# Patient Record
Sex: Female | Born: 1960 | Race: White | Hispanic: No | Marital: Married | Smoking: Former smoker
Health system: Southern US, Community
[De-identification: ages and names within clinical notes are randomized; demographics above are authoritative.]

## PROBLEM LIST (undated history)

## (undated) DIAGNOSIS — F419 Anxiety disorder, unspecified: Secondary | ICD-10-CM

## (undated) DIAGNOSIS — E119 Type 2 diabetes mellitus without complications: Secondary | ICD-10-CM

## (undated) HISTORY — DX: Anxiety disorder, unspecified: F41.9

---

## 2000-12-14 HISTORY — PX: VAGINAL HYSTERECTOMY: SUR661

## 2001-12-14 HISTORY — PX: CARPAL TUNNEL RELEASE: SHX101

## 2008-09-21 ENCOUNTER — Ambulatory Visit: Payer: Self-pay | Admitting: Urology

## 2009-10-21 ENCOUNTER — Ambulatory Visit: Payer: Self-pay | Admitting: Family

## 2010-02-27 ENCOUNTER — Ambulatory Visit: Payer: Self-pay | Admitting: Otolaryngology

## 2010-07-01 ENCOUNTER — Ambulatory Visit: Payer: Self-pay | Admitting: Internal Medicine

## 2010-12-14 HISTORY — PX: TONSILLECTOMY: SUR1361

## 2011-08-10 LAB — HM COLONOSCOPY: HM Colonoscopy: NORMAL

## 2011-10-27 ENCOUNTER — Other Ambulatory Visit: Payer: Self-pay | Admitting: Internal Medicine

## 2011-10-28 ENCOUNTER — Other Ambulatory Visit: Payer: Self-pay | Admitting: Internal Medicine

## 2011-10-28 MED ORDER — TRAMADOL HCL 50 MG PO TABS: 50.0000 mg | ORAL_TABLET | Freq: Four times a day (QID) | ORAL | Status: DC | PRN

## 2011-11-24 ENCOUNTER — Other Ambulatory Visit: Payer: Self-pay | Admitting: Internal Medicine

## 2011-11-26 ENCOUNTER — Other Ambulatory Visit: Payer: Self-pay | Admitting: Internal Medicine

## 2012-02-23 ENCOUNTER — Ambulatory Visit: Payer: Self-pay | Admitting: Internal Medicine

## 2012-02-23 ENCOUNTER — Other Ambulatory Visit: Payer: Self-pay | Admitting: Internal Medicine

## 2012-02-23 ENCOUNTER — Encounter: Payer: Self-pay | Admitting: Internal Medicine

## 2012-02-23 ENCOUNTER — Ambulatory Visit (INDEPENDENT_AMBULATORY_CARE_PROVIDER_SITE_OTHER): Payer: BC Managed Care – PPO | Admitting: Internal Medicine

## 2012-02-23 VITALS — BP 126/72 | HR 74 | Temp 97.9°F | Resp 14 | Ht 61.0 in | Wt 121.2 lb

## 2012-02-23 DIAGNOSIS — M129 Arthropathy, unspecified: Secondary | ICD-10-CM

## 2012-02-23 DIAGNOSIS — M1990 Unspecified osteoarthritis, unspecified site: Secondary | ICD-10-CM

## 2012-02-23 DIAGNOSIS — M5412 Radiculopathy, cervical region: Secondary | ICD-10-CM

## 2012-02-23 DIAGNOSIS — M542 Cervicalgia: Secondary | ICD-10-CM

## 2012-02-23 DIAGNOSIS — G5600 Carpal tunnel syndrome, unspecified upper limb: Secondary | ICD-10-CM

## 2012-02-23 DIAGNOSIS — G5602 Carpal tunnel syndrome, left upper limb: Secondary | ICD-10-CM

## 2012-02-23 DIAGNOSIS — Z13220 Encounter for screening for lipoid disorders: Secondary | ICD-10-CM

## 2012-02-23 DIAGNOSIS — M792 Neuralgia and neuritis, unspecified: Secondary | ICD-10-CM | POA: Insufficient documentation

## 2012-02-23 MED ORDER — DICLOFENAC SODIUM 75 MG PO TBEC: 75.0000 mg | DELAYED_RELEASE_TABLET | Freq: Two times a day (BID) | ORAL | Status: DC

## 2012-02-23 MED ORDER — HYDROCODONE-ACETAMINOPHEN 10-325 MG PO TABS: 1.0000 | ORAL_TABLET | Freq: Three times a day (TID) | ORAL | Status: AC | PRN

## 2012-02-23 MED ORDER — TRAMADOL HCL 50 MG PO TABS: 50.0000 mg | ORAL_TABLET | Freq: Four times a day (QID) | ORAL | Status: DC | PRN

## 2012-02-23 NOTE — Assessment & Plan Note (Signed)
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  Plain films, autoimmune serologies ordered.

## 2012-02-23 NOTE — Patient Instructions (Signed)
Come back for fasting labs at your earliest convenience (no food or drink)    X rays of left thumb,  And MRI cervical spine to be scheduled.    Referral to Neurologist at [facility] in [place] in process  We will try a stronger antiinflammatory than advil called diclofenac . (do not take advil with it,  But continue tramadol)

## 2012-02-23 NOTE — Assessment & Plan Note (Addendum)
I believe she has two separate issues : carpal tunnel and radiculopathy of left shoulder , possibly from cervical spinal stenosis. MRI of cervical spine ordered.

## 2012-02-23 NOTE — Progress Notes (Signed)
Subjective:    Patient ID: Sherry Petersen, female    DOB: [birth year], 51 y.o.   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 sometimes to the shoulder.  hadn and foreaem has intermittent nu,mbness.  Tried wearing a nighttime neutral splint which she purchased at [employer] no difference. Has persistent neck pain with prior neck fmlms but no prior MR.    No past medical history on file. Current Outpatient Prescriptions on File Prior to Visit  Medication Sig Dispense Refill  . amitriptyline (ELAVIL) 100 MG tablet TAKE 1 TABLET BY MOUTH EVERY NIGHT AT BEDTIME  30 tablet  6     Review of Systems  Constitutional: Negative for fever, chills and unexpected weight change.  HENT: Negative for hearing loss, ear pain, nosebleeds, congestion, sore throat, facial swelling, rhinorrhea, sneezing, mouth sores, trouble swallowing, neck pain, neck stiffness, voice change, postnasal drip, sinus pressure, tinnitus and ear discharge.   Eyes: Negative for pain, discharge, redness and visual disturbance.  Respiratory: Negative for cough, chest tightness, shortness of breath, wheezing and stridor.   Cardiovascular: Negative for chest pain, palpitations and leg swelling.  Musculoskeletal: Positive for arthralgias. Negative for myalgias.  Skin: Negative for color change and rash.  Neurological: Positive for numbness. Negative for dizziness, weakness, light-headedness and headaches.  Hematological: Negative for adenopathy.       Objective:   Physical Exam  Constitutional: She is oriented to person, place, and time. She appears well-developed and well-nourished.  HENT:  Mouth/Throat: Oropharynx is clear and moist.  Eyes: EOM are normal. Pupils are equal, round, and reactive to light. No scleral icterus.  Neck: Normal range  of motion. Neck supple. No JVD present. No thyromegaly present.  Cardiovascular: Normal rate, regular rhythm, normal heart sounds and intact distal pulses.   Pulmonary/Chest: Effort normal and breath sounds normal.  Abdominal: Soft. Bowel sounds are normal. She exhibits no mass. There is no tenderness.  Musculoskeletal: Normal range of motion. She exhibits tenderness. She exhibits no edema.       Arms: Lymphadenopathy:    She has no cervical adenopathy.  Neurological: She is alert and oriented to person, place, and time.  Skin: Skin is warm and dry.  Psychiatric: She has a normal mood and affect.      Assessment & Plan:   Neuralgia of upper extremity I believe she has two separate issues : carpal tunnel and radiculopathy of left shoulder , possibly from cervical spinal stenosis. MRI of cervical spine ordered.   Arthritis Vs DJD of left thumb.  Plain films, autoimmune serologies ordered.     Updated Medication List Outpatient Encounter Prescriptions as of 02/23/2012  Medication Sig Dispense Refill  . amitriptyline (ELAVIL) 100 MG tablet TAKE 1 TABLET BY MOUTH EVERY NIGHT AT BEDTIME  30 tablet  6  . DISCONTD: traMADol (ULTRAM) 50 MG tablet Take 1 tablet (50 mg total) by mouth every 6 (six) hours as needed for pain. Maximum dose= 8 tablets per day  120 tablet  2  . DISCONTD: traMADol (ULTRAM) 50 MG tablet Take 1 tablet (50 mg total) by mouth every 6 (six) hours as needed for pain. Maximum dose= 8 tablets per day  120 tablet  2  . HYDROcodone-acetaminophen (NORCO) 10-325 MG per tablet Take 1 tablet by mouth every 8 (  eight) hours as needed for pain.  60 tablet  0  . DISCONTD: diclofenac (VOLTAREN) 75 MG EC tablet Take 1 tablet (75 mg total) by mouth 2 (two) times daily.  60 tablet  1  . DISCONTD: traMADol (ULTRAM) 50 MG tablet TAKE ONE TABLET BY MOUTH EVERY 6 HOURS AS NEEDED FOR PAIN  120 tablet  3

## 2012-02-26 ENCOUNTER — Telehealth: Payer: Self-pay | Admitting: Internal Medicine

## 2012-02-26 ENCOUNTER — Other Ambulatory Visit (INDEPENDENT_AMBULATORY_CARE_PROVIDER_SITE_OTHER): Payer: BC Managed Care – PPO | Admitting: *Deleted

## 2012-02-26 DIAGNOSIS — M5412 Radiculopathy, cervical region: Secondary | ICD-10-CM

## 2012-02-26 DIAGNOSIS — Z13220 Encounter for screening for lipoid disorders: Secondary | ICD-10-CM

## 2012-02-26 DIAGNOSIS — M792 Neuralgia and neuritis, unspecified: Secondary | ICD-10-CM

## 2012-02-26 DIAGNOSIS — G5602 Carpal tunnel syndrome, left upper limb: Secondary | ICD-10-CM

## 2012-02-26 DIAGNOSIS — M1990 Unspecified osteoarthritis, unspecified site: Secondary | ICD-10-CM

## 2012-02-26 DIAGNOSIS — G5600 Carpal tunnel syndrome, unspecified upper limb: Secondary | ICD-10-CM

## 2012-02-26 DIAGNOSIS — M129 Arthropathy, unspecified: Secondary | ICD-10-CM

## 2012-02-26 LAB — TSH: TSH: 0.58 u[IU]/mL (ref 0.35–5.50)

## 2012-02-26 LAB — LIPID PANEL
HDL: 63.8 mg/dL (ref >39.00)
Triglycerides: 78 mg/dL (ref 0.0–149.0)
VLDL: 15.6 mg/dL (ref 0.0–40.0)

## 2012-02-26 LAB — LDL CHOLESTEROL, DIRECT: Direct LDL: 136.2 mg/dL

## 2012-02-26 LAB — SEDIMENTATION RATE: Sed Rate: 8 mm/hr (ref 0–22)

## 2012-02-26 NOTE — Telephone Encounter (Signed)
Thumb joint looked ok on plain films,  No arthritis changes.

## 2012-02-26 NOTE — Telephone Encounter (Signed)
Patient notified of results.

## 2012-02-27 LAB — COMPLETE METABOLIC PANEL WITH GFR
AST: 14 U/L (ref 0–37)
Albumin: 4.1 g/dL (ref 3.5–5.2)
Alkaline Phosphatase: 76 U/L (ref 39–117)
BUN: 17 mg/dL (ref 6–23)
Creat: 0.74 mg/dL (ref 0.50–1.10)
Potassium: 4.3 mEq/L (ref 3.5–5.3)
Total Bilirubin: 0.4 mg/dL (ref 0.3–1.2)

## 2012-02-27 LAB — C-REACTIVE PROTEIN: CRP: 0.03 mg/dL (ref <0.60)

## 2012-02-29 LAB — ANA: Anti Nuclear Antibody(ANA): NEGATIVE

## 2012-03-02 ENCOUNTER — Ambulatory Visit: Payer: Self-pay | Admitting: Internal Medicine

## 2012-03-03 ENCOUNTER — Telehealth: Payer: Self-pay | Admitting: Internal Medicine

## 2012-03-03 DIAGNOSIS — R200 Anesthesia of skin: Secondary | ICD-10-CM

## 2012-03-03 NOTE — Telephone Encounter (Signed)
Her MRI of the neck was fine.   No signs of disk protusion  that could be causing her numbness.  Her symptoms may be from carpal tunnel.  Would she like to see a neurologist for confirmation testing?

## 2012-03-04 ENCOUNTER — Encounter: Payer: Self-pay | Admitting: Internal Medicine

## 2012-03-04 NOTE — Telephone Encounter (Signed)
Patient notified of results.  She wants to see a neurologists.  Please advise.

## 2012-03-04 NOTE — Telephone Encounter (Signed)
Order for referral to Dr. Modesto Charon in [place] [facility]

## 2012-03-07 ENCOUNTER — Encounter: Payer: Self-pay | Admitting: Neurology

## 2012-03-07 NOTE — Telephone Encounter (Signed)
Patient notified we will be setting her up with Dr. Modesto Charon

## 2012-03-14 ENCOUNTER — Other Ambulatory Visit (INDEPENDENT_AMBULATORY_CARE_PROVIDER_SITE_OTHER): Payer: BC Managed Care – PPO

## 2012-03-14 ENCOUNTER — Encounter: Payer: Self-pay | Admitting: Neurology

## 2012-03-14 ENCOUNTER — Ambulatory Visit (INDEPENDENT_AMBULATORY_CARE_PROVIDER_SITE_OTHER): Payer: BC Managed Care – PPO | Admitting: Neurology

## 2012-03-14 VITALS — BP 134/78 | HR 62 | Wt 121.6 lb

## 2012-03-14 DIAGNOSIS — R209 Unspecified disturbances of skin sensation: Secondary | ICD-10-CM

## 2012-03-14 DIAGNOSIS — R200 Anesthesia of skin: Secondary | ICD-10-CM

## 2012-03-14 LAB — SEDIMENTATION RATE: Sed Rate: 8 mm/hr (ref 0–22)

## 2012-03-14 LAB — COMPREHENSIVE METABOLIC PANEL
ALT: 10 U/L (ref 0–35)
Alkaline Phosphatase: 79 U/L (ref 39–117)
Glucose, Bld: 96 mg/dL (ref 70–99)
Sodium: 143 mEq/L (ref 135–145)
Total Bilirubin: 0.2 mg/dL — ABNORMAL LOW (ref 0.3–1.2)
Total Protein: 6.5 g/dL (ref 6.0–8.3)

## 2012-03-14 LAB — CBC WITH DIFFERENTIAL/PLATELET
Basophils Absolute: 0 10*3/uL (ref 0.0–0.1)
Eosinophils Absolute: 0.1 10*3/uL (ref 0.0–0.7)
HCT: 40.2 % (ref 36.0–46.0)
Lymphs Abs: 2.1 10*3/uL (ref 0.7–4.0)
MCHC: 33.2 g/dL (ref 30.0–36.0)
MCV: 93.7 fl (ref 78.0–100.0)
Monocytes Absolute: 0.4 10*3/uL (ref 0.1–1.0)
Platelets: 243 10*3/uL (ref 150.0–400.0)
RDW: 13.7 % (ref 11.5–14.6)

## 2012-03-14 NOTE — Progress Notes (Signed)
Dear Dr. Darrick Huntsman,  I saw  Sherry Petersen in consultation today in [facility] clinic for her problem with bilateral hand numbness and tingling.  As you may recall, she is a 51 y.o. year old female with a history of right side carpal tunnel release who presents with a six month history of worsening bilateral hand numbness that involves all the fingers.  She says it is worse in the morning.  Shaking the hand does not relieve it but hyperextending it does.  She does get neck pain, mostly on the right, but it seems unrelated to the numbness and tingling.  She gets intermittent sharp pains in her head and right inner thigh at times again unrelated to the constant hand sensory changes.  You got an MRI of her C-spine that was reportedly negative.  She says that her previous CTS of her right hand did not feel the same.  Medical Hx:  right CTS  Past Surgical History  Procedure Date  . Carpal tunnel release 2003  . Vaginal hysterectomy 2002  . Tonsillectomy 2012    History   Social History  . Marital Status: Married    Spouse Name: N/A    Number of Children: N/A  . Years of Education: N/A   Social History Main Topics  . Smoking status: Current Some Day Smoker    Types: Cigarettes  . Smokeless tobacco: Never Used   Comment: smokes 4-5 cigs per day  . Alcohol Use: No     never  . Drug Use: No  . Sexually Active: Not on file   Other Topics Concern  . Not on file   Social History Narrative  . No narrative on file    FamHx:  no neurologic problems.  Current Outpatient Prescriptions on File Prior to Visit  Medication Sig Dispense Refill  . amitriptyline (ELAVIL) 100 MG tablet TAKE 1 TABLET BY MOUTH EVERY NIGHT AT BEDTIME  30 tablet  6  . diclofenac (VOLTAREN) 75 MG EC tablet TAKE 1 TABLET BY MOUTH TWICE DAILY  180 tablet  0    Allergies  Allergen Reactions  . Penicillins   . Sulfa Drugs Cross Reactors       ROS:  13 systems were reviewed and are notable for no fevers,  chills, weight loss.  She has pain in her left thumb MCP joint.  All other review of systems are unremarkable.   Examination:  Filed Vitals:   03/14/12 1218  BP: 134/78  Pulse: 62  Weight: 121 lb 9.6 oz (55.157 kg)     In general, well appearing women.  Cardiovascular: The patient has a regular rate and rhythm and no carotid bruits.  Fundoscopy:  Disks are flat. Vessel caliber within normal limits.  Mental status:   The patient is oriented to person, place and time. Recent and remote memory are intact. Attention span and concentration are normal. Language including repetition, naming, following commands are intact. Fund of knowledge of current and historical events, as well as vocabulary are normal.  Cranial Nerves: Pupils are equally round and reactive to light. Visual fields full to confrontation. Extraocular movements are intact without nystagmus. Facial sensation and muscles of mastication are intact. Muscles of facial expression are symmetric. Hearing intact to bilateral finger rub. Tongue protrusion, uvula, palate midline.  Shoulder shrug intact  Motor:  The patient has normal bulk and tone, no pronator drift.  There are no adventitious movements.  5/5 muscle strength bilaterally.  Reflexes:   Biceps  Triceps  Brachioradialis Knee Ankle  Right 2+  1+  1+   2+ 2+  Left  2+  1+  2+   2+ 2+  Toes down  Coordination:  Normal finger to nose.  No dysdiadokinesia.  Sensation is seems decreased to temperature and pain in hands -> ?length dep.  Vibration normal elsewhere.  Gait and Station are normal.  Tandem gait is intact.  Romberg is negative.  Illiciting maneuvers ->  + Left wrist Tinel's, - bilateral ulnar Tinel's at elbow, - bilateral spurlings.   Impression.Recs 1.  Bilateral hand numbness - likely CTS bilaterally.  I am going to get an EMG/NCS to confirm CTS and rule out radiculopathy.  I will send off PN labs and get a copy of the MRI C-spine.  If NCS confirms CTS  then I will send her for consideration of decompression of the left CT as it is the worse one.   We will see the patient back in 2 months.  Thank you for having [place] see Sherry Petersen in consultation.  Feel free to contact me with any questions.  Lupita Raider Modesto Charon, MD [facility], [place] [address] [place], [place] [address] Phone: [phone] Fax: [phone].

## 2012-03-14 NOTE — Patient Instructions (Signed)
Go to the basement to have your labs drawn today.  Your NCV/EMG is scheduled at [facility] located at [address] in [place] on Monday, April 22nd at 10:45 am. Please arrive 15 minutes prior to your scheduled appointment.  [phone]

## 2012-03-15 ENCOUNTER — Encounter: Payer: Self-pay | Admitting: Internal Medicine

## 2012-03-15 LAB — C-REACTIVE PROTEIN: CRP: 0.05 mg/dL (ref <0.60)

## 2012-03-16 LAB — PROTEIN ELECTROPHORESIS, SERUM
Alpha-2-Globulin: 11 % (ref 7.1–11.8)
Gamma Globulin: 6.3 % — ABNORMAL LOW (ref 11.1–18.8)

## 2012-03-16 LAB — METHYLMALONIC ACID, SERUM: Methylmalonic Acid, Quant: 0.31 umol/L (ref <0.40)

## 2012-03-16 LAB — IMMUNOFIXATION ELECTROPHORESIS
IgA: 63 mg/dL — ABNORMAL LOW (ref 69–380)
IgG (Immunoglobin G), Serum: 398 mg/dL — ABNORMAL LOW (ref 690–1700)

## 2012-03-25 ENCOUNTER — Telehealth: Payer: Self-pay | Admitting: Neurology

## 2012-03-25 ENCOUNTER — Ambulatory Visit: Payer: BC Managed Care – PPO | Admitting: Neurology

## 2012-03-25 NOTE — Telephone Encounter (Signed)
Spoke with the patient. Information given re: lab results. Discussed Vit B 12 supplement if desired. No other concerns mentioned at this time.

## 2012-03-25 NOTE — Telephone Encounter (Signed)
Message copied by Benay Spice on Fri Mar 25, 2012  8:36 AM ------      Message from: Denton Meek H      Created: Wed Mar 23, 2012  6:20 PM       please let Ms. Whitcomb know that her labs looked looked ok.  Her B12 was normal but on the low side so she may want to take a vitamin b12 supplement 1mg  a day.

## 2012-03-28 ENCOUNTER — Encounter: Payer: Self-pay | Admitting: Internal Medicine

## 2012-04-10 NOTE — Progress Notes (Addendum)
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  Did not want further testing.  going to see if patient would like to see a surgeon about her neuropathy.

## 2012-04-11 ENCOUNTER — Telehealth: Payer: Self-pay | Admitting: Neurology

## 2012-04-11 NOTE — Telephone Encounter (Signed)
Message copied by Benay Spice on Mon Apr 11, 2012  8:58 AM ------      Message from: Milas Gain      Created: Sun Apr 10, 2012 10:31 PM      Regarding: NCS       Let Ms. Brackens know that her NCS revealed signs of carpal tunnel at her left wrist.  We could refer her for consideration of possible decompression(to Dr. Amanda Pea or whomever she prefers) if she would like or she can wait to discuss it with me at her next appt.

## 2012-04-11 NOTE — Telephone Encounter (Signed)
Left a message for the patient to return my call.  

## 2012-04-11 NOTE — Telephone Encounter (Signed)
The patient returned my call. Information given re: NCV and CTS. The patient wishes to wait and discuss options at her f/u appointment in June. She does have splints but they really don't help. Suggested OTC pain meds as per package directions. The patient agrees with this plan.

## 2012-04-13 ENCOUNTER — Encounter: Payer: Self-pay | Admitting: Neurology

## 2012-05-24 ENCOUNTER — Ambulatory Visit: Payer: BC Managed Care – PPO | Admitting: Neurology

## 2012-06-29 ENCOUNTER — Ambulatory Visit (INDEPENDENT_AMBULATORY_CARE_PROVIDER_SITE_OTHER): Payer: BC Managed Care – PPO | Admitting: Internal Medicine

## 2012-06-29 ENCOUNTER — Encounter: Payer: Self-pay | Admitting: Internal Medicine

## 2012-06-29 VITALS — BP 134/78 | HR 62 | Temp 98.7°F | Ht 61.0 in | Wt 120.0 lb

## 2012-06-29 DIAGNOSIS — Z1239 Encounter for other screening for malignant neoplasm of breast: Secondary | ICD-10-CM

## 2012-06-29 DIAGNOSIS — F419 Anxiety disorder, unspecified: Secondary | ICD-10-CM

## 2012-06-29 DIAGNOSIS — F489 Nonpsychotic mental disorder, unspecified: Secondary | ICD-10-CM

## 2012-06-29 DIAGNOSIS — K5909 Other constipation: Secondary | ICD-10-CM | POA: Insufficient documentation

## 2012-06-29 DIAGNOSIS — F411 Generalized anxiety disorder: Secondary | ICD-10-CM

## 2012-06-29 DIAGNOSIS — F17200 Nicotine dependence, unspecified, uncomplicated: Secondary | ICD-10-CM

## 2012-06-29 DIAGNOSIS — K5900 Constipation, unspecified: Secondary | ICD-10-CM

## 2012-06-29 DIAGNOSIS — G5600 Carpal tunnel syndrome, unspecified upper limb: Secondary | ICD-10-CM

## 2012-06-29 DIAGNOSIS — F5105 Insomnia due to other mental disorder: Secondary | ICD-10-CM

## 2012-06-29 DIAGNOSIS — G5602 Carpal tunnel syndrome, left upper limb: Secondary | ICD-10-CM

## 2012-06-29 DIAGNOSIS — Z720 Tobacco use: Secondary | ICD-10-CM

## 2012-06-29 MED ORDER — LUBIPROSTONE 8 MCG PO CAPS: 8.0000 ug | ORAL_CAPSULE | Freq: Two times a day (BID) | ORAL | Status: DC

## 2012-06-29 MED ORDER — ALPRAZOLAM 0.5 MG PO TABS: 0.5000 mg | ORAL_TABLET | Freq: Every evening | ORAL | Status: DC | PRN

## 2012-06-29 MED ORDER — PREDNISONE (PAK) 10 MG PO TABS: ORAL_TABLET | ORAL | Status: AC

## 2012-06-29 NOTE — Progress Notes (Signed)
Patient ID: Sherry Petersen, female   DOB: 19-Feb-1961, 51 y.o.   MRN: [id number]  Patient Active Problem List  Diagnosis  . Neuralgia of upper extremity  . Arthritis  . Carpal tunnel syndrome of left wrist  . Constipation  . Tobacco abuse  . Insomnia secondary to anxiety    Subjective:  CC:   Chief Complaint  Patient presents with  . Hypertension    ?possible elevated BP  . Headache  . Stress    HPI:   Sherry Petersen a 51 y.o. female who presents for follow up on chronic conditions 1) left wrist pain , diagnosed with CTS by EMG studies recently.  No response to diclofenac. No weakness or fumbling with hand.  No numbness.  Wants to defer surgery for now. 2) Insomnia, increased anxiety secondary to caring for ailing mother  At home with hospice for dementia.  3) Chronic constipation, despite daily use of stool softeners and fiber laxatives.   Past Medical History  Diagnosis Date  . Hematuria     CT Scan normal    Past Surgical History  Procedure Date  . Carpal tunnel release 2003  . Vaginal hysterectomy 2002  . Tonsillectomy 2012     The following portions of the patient's history were reviewed and updated as appropriate: Allergies, current medications, and problem list.    Review of Systems:   Positive for insomnia, constipaton, chronic, and   Left wrist pain. Remainder of comprehensive ROS was negative.    History   Social History  . Marital Status: Married    Spouse Name: N/A    Number of Children: N/A  . Years of Education: N/A   Occupational History  . Not on file.   Social History Main Topics  . Smoking status: Current Some Day Smoker    Types: Cigarettes  . Smokeless tobacco: Never Used   Comment: smokes 4-5 cigs per day  . Alcohol Use: No     never  . Drug Use: No  . Sexually Active: Not on file   Other Topics Concern  . Not on file   Social History Narrative  . No narrative on file    Objective:  BP 134/78  Pulse 62  Temp 98.7  F (37.1 C) (Oral)  Ht 5\' 1"  (1.549 m)  Wt 120 lb (54.432 kg)  BMI 22.67 kg/m2  SpO2 96%  General appearance: alert, cooperative and appears stated age Ears: normal TM's and external ear canals both ears Throat: lips, mucosa, and tongue normal; teeth and gums normal Neck: no adenopathy, no carotid bruit, supple, symmetrical, trachea midline and thyroid not enlarged, symmetric, no tenderness/mass/nodules Back: symmetric, no curvature. ROM normal. No CVA tenderness. Lungs: clear to auscultation bilaterally Heart: regular rate and rhythm, S1, S2 normal, no murmur, click, rub or gallop Abdomen: soft, non-tender; bowel sounds normal; no masses,  no organomegaly Pulses: 2+ and symmetric Skin: Skin color, texture, turgor normal. No rashes or lesions Lymph nodes: Cervical, supraclavicular, and axillary nodes normal.  Assessment and Plan:  Carpal tunnel syndrome of left wrist She wants to defer surgery for now.  Will resume nightly use of wrist splints. Prednisone taper,  tramadol  Constipation Not amenable to daily stool softeners and fiber  laxatives .  Trial of amitiza 8 mcg  Bid dscussed, samples given for one week.    Insomnia secondary to anxiety With early wakening and difficulty returning to sleep despite use of elavil.  Prn alprazolam    Updated Medication  List Outpatient Encounter Prescriptions as of 06/29/2012  Medication Sig Dispense Refill  . amitriptyline (ELAVIL) 100 MG tablet TAKE 1 TABLET BY MOUTH EVERY NIGHT AT BEDTIME  30 tablet  6  . cholecalciferol (VITAMIN D) 1000 UNITS tablet Take 5,000 Units by mouth daily.      . traMADol (ULTRAM) 50 MG tablet Take 50 mg by mouth every 6 (six) hours as needed.      . ALPRAZolam (XANAX) 0.5 MG tablet Take 1 tablet (0.5 mg total) by mouth at bedtime as needed for sleep or anxiety.  30 tablet  1  . diclofenac (VOLTAREN) 75 MG EC tablet TAKE 1 TABLET BY MOUTH TWICE DAILY  180 tablet  0  . lubiprostone (AMITIZA) 8 MCG capsule Take 1  capsule (8 mcg total) by mouth 2 (two) times daily with a meal.  14 capsule  0  . predniSONE (STERAPRED UNI-PAK) 10 MG tablet 6 tablets on Day 1 , then reduce by 1 tablet daily until gone  21 tablet  0     Orders Placed This Encounter  Procedures  . MM Digital Screening    Return in about 1 month (around 07/30/2012).

## 2012-06-29 NOTE — Patient Instructions (Addendum)
Stop the diclofenac for two weeks while we do the prednisone taper.  Use the alprazolam at night if needed for insomnia  Try the amitiza twice daily for relief of constipaiton.. If it works,  Call for a prescrption  Return in one month for annual PE  Mammogram ordered.

## 2012-06-30 DIAGNOSIS — F419 Anxiety disorder, unspecified: Secondary | ICD-10-CM | POA: Insufficient documentation

## 2012-06-30 NOTE — Assessment & Plan Note (Signed)
Not amenable to daily stool softeners and fiber  laxatives .  Trial of amitiza 8 mcg  Bid dscussed, samples given for one week.

## 2012-06-30 NOTE — Assessment & Plan Note (Addendum)
She wants to defer surgery for now.  Will resume nightly use of wrist splints. Prednisone taper,  tramadol

## 2012-06-30 NOTE — Assessment & Plan Note (Addendum)
With early wakening and difficulty returning to sleep despite use of elavil.  Prn alprazolam

## 2012-07-12 ENCOUNTER — Encounter: Payer: Self-pay | Admitting: Internal Medicine

## 2012-07-24 ENCOUNTER — Other Ambulatory Visit: Payer: Self-pay | Admitting: Internal Medicine

## 2012-07-28 ENCOUNTER — Ambulatory Visit: Payer: Self-pay | Admitting: Internal Medicine

## 2012-08-05 ENCOUNTER — Encounter: Payer: Self-pay | Admitting: Internal Medicine

## 2012-08-09 ENCOUNTER — Other Ambulatory Visit (HOSPITAL_COMMUNITY)
Admission: RE | Admit: 2012-08-09 | Discharge: 2012-08-09 | Disposition: A | Discharge status: Home / Self Care | Payer: BC Managed Care – PPO | Source: Ambulatory Visit | Attending: Internal Medicine | Admitting: Internal Medicine

## 2012-08-09 ENCOUNTER — Encounter: Payer: Self-pay | Admitting: Internal Medicine

## 2012-08-09 ENCOUNTER — Ambulatory Visit (INDEPENDENT_AMBULATORY_CARE_PROVIDER_SITE_OTHER): Payer: BC Managed Care – PPO | Admitting: Internal Medicine

## 2012-08-09 VITALS — BP 122/74 | HR 70 | Temp 98.9°F | Resp 14 | Ht 61.0 in | Wt 118.5 lb

## 2012-08-09 DIAGNOSIS — R87810 Cervical high risk human papillomavirus (HPV) DNA test positive: Secondary | ICD-10-CM | POA: Insufficient documentation

## 2012-08-09 DIAGNOSIS — G5602 Carpal tunnel syndrome, left upper limb: Secondary | ICD-10-CM

## 2012-08-09 DIAGNOSIS — G5600 Carpal tunnel syndrome, unspecified upper limb: Secondary | ICD-10-CM

## 2012-08-09 DIAGNOSIS — F17200 Nicotine dependence, unspecified, uncomplicated: Secondary | ICD-10-CM

## 2012-08-09 DIAGNOSIS — K5900 Constipation, unspecified: Secondary | ICD-10-CM

## 2012-08-09 DIAGNOSIS — Z124 Encounter for screening for malignant neoplasm of cervix: Secondary | ICD-10-CM

## 2012-08-09 DIAGNOSIS — Z01419 Encounter for gynecological examination (general) (routine) without abnormal findings: Secondary | ICD-10-CM | POA: Insufficient documentation

## 2012-08-09 DIAGNOSIS — Z720 Tobacco use: Secondary | ICD-10-CM

## 2012-08-09 LAB — HM MAMMOGRAPHY: HM Mammogram: NORMAL

## 2012-08-09 MED ORDER — LUBIPROSTONE 8 MCG PO CAPS: 8.0000 ug | ORAL_CAPSULE | Freq: Two times a day (BID) | ORAL | Status: AC

## 2012-08-09 MED ORDER — LUBIPROSTONE 24 MCG PO CAPS: 24.0000 ug | ORAL_CAPSULE | Freq: Two times a day (BID) | ORAL | Status: DC

## 2012-08-09 NOTE — Patient Instructions (Addendum)
Your colonoscopy last year was normal except for hemorrhoids.  Your stomach study last year showed irritation ; it was biopsies and there was no cancer.    I am prescribing amitiza twice daily for your constipation

## 2012-08-09 NOTE — Progress Notes (Signed)
Patient ID: Sherry Petersen, female   DOB: December 09, 1961, 51 y.o.   MRN: [id number] Subjective:    Sherry Petersen is a 51 y.o. female who presents for an annual exam. The patient has no complaints today. The patient is not sexually active. GYN screening history: last pap: patient does not recall results of last pap. The patient wears seatbelts: yes. The patient participates in regular exercise: no. Has the patient ever been transfused or tattooed?: yes. The patient reports that there is not domestic violence in her life.   Menstrual History: OB History    Grav Para Term Preterm Abortions TAB SAB Ect Mult Living                  Menarche age:69 No LMP recorded. Patient has had a  Supracervical hysterectomy.    The following portions of the patient's history were reviewed and updated as appropriate: allergies, current medications, past family history, past medical history, past social history, past surgical history and problem list.  Review of Systems A comprehensive review of systems was negative except for: Musculoskeletal: positive for arthralgias    Objective:    BP 122/74  Pulse 70  Temp 98.9 F (37.2 C) (Oral)  Resp 14  Ht 5\' 1"  (1.549 m)  Wt 118 lb 8 oz (53.751 kg)  BMI 22.39 kg/m2  SpO2 97%  General Appearance:    Alert, cooperative, no distress, appears stated age  Head:    Normocephalic, without obvious abnormality, atraumatic  Eyes:    PERRL, conjunctiva/corneas clear, EOM's intact, fundi    benign, both eyes  Ears:    Normal TM's and external ear canals, both ears  Nose:   Nares normal, septum midline, mucosa normal, no drainage    or sinus tenderness  Throat:   Lips, mucosa, and tongue normal; teeth and gums normal  Neck:   Supple, symmetrical, trachea midline, no adenopathy;    thyroid:  no enlargement/tenderness/nodules; no carotid   bruit or JVD  Back:     Symmetric, no curvature, ROM normal, no CVA tenderness  Lungs:     Clear to auscultation bilaterally,  respirations unlabored  Chest Wall:    No tenderness or deformity   Heart:    Regular rate and rhythm, S1 and S2 normal, no murmur, rub   or gallop  Breast Exam:    No tenderness, masses, or nipple abnormality  Abdomen:     Soft, non-tender, bowel sounds active all four quadrants,    no masses, no organomegaly  Genitalia:    Normal female without lesion, discharge or tenderness. Cervix present,  Uterus surgically absent.      Extremities:   Extremities normal, atraumatic, no cyanosis or edema  Pulses:   2+ and symmetric all extremities  Skin:   Skin color, texture, turgor normal, no rashes or lesions  Lymph nodes:   Cervical, supraclavicular, and axillary nodes normal  Neurologic:   CNII-XII intact, normal strength, sensation and reflexes    throughout  .    Assessment:    Healthy female exam.    Carpal tunnel syndrome of left wrist By April EMG studies, patient continues to defer surgery.   Tobacco abuse The patient was counseled on the dangers of tobacco use, and was advised to quit.  Reviewed strategies to maximize success, including removing cigarettes and smoking materials from environment, substitution of other forms of reinforcement and local smoking cessation programs   Constipation Secondary to IBS with nornal colonoscopy 2012 [place].  Trial of amitiza discussed. Samples given.    Updated Medication List Outpatient Encounter Prescriptions as of 08/09/2012  Medication Sig Dispense Refill  . ALPRAZolam (XANAX) 0.5 MG tablet Take 1 tablet (0.5 mg total) by mouth at bedtime as needed for sleep or anxiety.  30 tablet  1  . amitriptyline (ELAVIL) 100 MG tablet TAKE 1 TABLET BY MOUTH EVERY NIGHT AT BEDTIME  30 tablet  6  . cholecalciferol (VITAMIN D) 1000 UNITS tablet Take 5,000 Units by mouth daily.      Marland Kitchen lubiprostone (AMITIZA) 8 MCG capsule Take 1 capsule (8 mcg total) by mouth 2 (two) times daily with a meal.  60 capsule  3  . traMADol (ULTRAM) 50 MG tablet Take 50 mg by  mouth every 6 (six) hours as needed.      Marland Kitchen DISCONTD: lubiprostone (AMITIZA) 8 MCG capsule Take 1 capsule (8 mcg total) by mouth 2 (two) times daily with a meal.  14 capsule  0  . DISCONTD: diclofenac (VOLTAREN) 75 MG EC tablet TAKE 1 TABLET BY MOUTH TWICE DAILY  180 tablet  0  . DISCONTD: lubiprostone (AMITIZA) 24 MCG capsule Take 1 capsule (24 mcg total) by mouth 2 (two) times daily with a meal.  60 capsule  3

## 2012-08-10 ENCOUNTER — Encounter: Payer: Self-pay | Admitting: Internal Medicine

## 2012-08-10 NOTE — Assessment & Plan Note (Signed)
The patient was counseled on the dangers of tobacco use, and was advised to quit.  Reviewed strategies to maximize success, including removing cigarettes and smoking materials from environment, substitution of other forms of reinforcement and local smoking cessation programs

## 2012-08-10 NOTE — Assessment & Plan Note (Signed)
Secondary to IBS with nornal colonoscopy 2012 Sherry Petersen.  Trial of amitiza discussed. Samples given.

## 2012-08-10 NOTE — Assessment & Plan Note (Signed)
By April EMG studies, patient continues to defer surgery.

## 2012-08-28 ENCOUNTER — Other Ambulatory Visit: Payer: Self-pay | Admitting: Internal Medicine

## 2013-01-20 ENCOUNTER — Other Ambulatory Visit: Payer: Self-pay | Admitting: Internal Medicine

## 2013-01-20 NOTE — Telephone Encounter (Signed)
30 day refill authorized.

## 2013-01-21 NOTE — Telephone Encounter (Signed)
Phoned In

## 2013-01-30 ENCOUNTER — Telehealth: Payer: Self-pay | Admitting: Internal Medicine

## 2013-01-30 DIAGNOSIS — R5383 Other fatigue: Secondary | ICD-10-CM

## 2013-01-30 DIAGNOSIS — Z13220 Encounter for screening for lipoid disorders: Secondary | ICD-10-CM

## 2013-01-30 DIAGNOSIS — E559 Vitamin D deficiency, unspecified: Secondary | ICD-10-CM

## 2013-01-30 NOTE — Telephone Encounter (Signed)
Patient needing physical labs done. She is scheduled on 2.26.14

## 2013-01-30 NOTE — Telephone Encounter (Signed)
Done

## 2013-02-08 ENCOUNTER — Other Ambulatory Visit (INDEPENDENT_AMBULATORY_CARE_PROVIDER_SITE_OTHER): Payer: BC Managed Care – PPO

## 2013-02-08 ENCOUNTER — Encounter: Payer: Self-pay | Admitting: *Deleted

## 2013-02-08 DIAGNOSIS — R5381 Other malaise: Secondary | ICD-10-CM

## 2013-02-08 DIAGNOSIS — Z13220 Encounter for screening for lipoid disorders: Secondary | ICD-10-CM

## 2013-02-08 LAB — POCT URINALYSIS DIPSTICK
Ketones, UA: NEGATIVE
Protein, UA: NEGATIVE
Spec Grav, UA: 1.015
Urobilinogen, UA: 0.2

## 2013-02-08 LAB — COMPREHENSIVE METABOLIC PANEL
ALT: 11 U/L (ref 0–35)
Albumin: 4.1 g/dL (ref 3.5–5.2)
CO2: 26 mEq/L (ref 19–32)
Chloride: 108 mEq/L (ref 96–112)
GFR: 79.41 mL/min (ref >60.00)
Glucose, Bld: 83 mg/dL (ref 70–99)
Potassium: 3.7 mEq/L (ref 3.5–5.1)
Sodium: 142 mEq/L (ref 135–145)
Total Protein: 6.6 g/dL (ref 6.0–8.3)

## 2013-02-08 LAB — TSH: TSH: 0.78 u[IU]/mL (ref 0.35–5.50)

## 2013-02-08 NOTE — Addendum Note (Signed)
Addended by: Montine Circle D on: 02/08/2013 10:37 AM   Modules accepted: Orders

## 2013-02-10 ENCOUNTER — Encounter: Payer: Self-pay | Admitting: Internal Medicine

## 2013-02-10 ENCOUNTER — Ambulatory Visit (INDEPENDENT_AMBULATORY_CARE_PROVIDER_SITE_OTHER): Payer: BC Managed Care – PPO | Admitting: Internal Medicine

## 2013-02-10 VITALS — BP 124/74 | HR 65 | Temp 98.6°F | Resp 16 | Wt 112.0 lb

## 2013-02-10 DIAGNOSIS — F17200 Nicotine dependence, unspecified, uncomplicated: Secondary | ICD-10-CM

## 2013-02-10 DIAGNOSIS — F411 Generalized anxiety disorder: Secondary | ICD-10-CM

## 2013-02-10 MED ORDER — SERTRALINE HCL 50 MG PO TABS: 50.0000 mg | ORAL_TABLET | Freq: Every day | ORAL | Status: DC

## 2013-02-10 MED ORDER — HYDROCODONE-HOMATROPINE 5-1.5 MG/5ML PO SYRP: 5.0000 mL | ORAL_SOLUTION | Freq: Four times a day (QID) | ORAL | Status: DC | PRN

## 2013-02-10 MED ORDER — ALPRAZOLAM 0.5 MG PO TABS: 0.5000 mg | ORAL_TABLET | Freq: Two times a day (BID) | ORAL | Status: DC | PRN
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
## 2013-02-10 NOTE — Patient Instructions (Addendum)
I am increasing your alprazolam to twice daily to help you anxiety.  I am also recommending that you start taking sertraline once daily for your anxiety  You have a viral  Syndrome .  The post nasal drip is causing your sore throat.  Lavage your sinuses twice daily with Simply saline nasal spray.  Use benadryl 25 mg every 8 hours and Sudafed PE 10 to 30 every 8 hours to manage the drainage and congestion.  Gargle with salt water often for the sore throat.  I am calling in Cheratussin cough syrup (has codeine) for the cough.  If the throat is no better  In 3 to 4 days OR  if you develop T > 100.4,  Green nasal discharge,  Or facial pain,  Call for an antibiotic.

## 2013-02-12 ENCOUNTER — Encounter: Payer: Self-pay | Admitting: Internal Medicine

## 2013-02-12 DIAGNOSIS — F410 Panic disorder [episodic paroxysmal anxiety] without agoraphobia: Secondary | ICD-10-CM | POA: Insufficient documentation

## 2013-02-12 DIAGNOSIS — N390 Urinary tract infection, site not specified: Secondary | ICD-10-CM | POA: Insufficient documentation

## 2013-02-12 NOTE — Assessment & Plan Note (Signed)
She's not emotionally ready to quit due to increased stressors at home.

## 2013-02-12 NOTE — Assessment & Plan Note (Signed)
She's been using alprazolam once and sometimes twice daily for management of insomnia and panic brought on by her impossible living situation. I have advised her to start an SSRI to mitigate escalation of use of alprazolam. Starting dose of sertraline and prescribed. Patient will return in 30 days.

## 2013-02-12 NOTE — Assessment & Plan Note (Signed)
She had an annular papular erythematous lesion on her eye to left chest wall which is tender to palpation has been present for over 6 months. It isn't sun exposed area. Referral to dermatology for excisional biopsy.

## 2013-02-12 NOTE — Assessment & Plan Note (Signed)
Symptoms of  URI are caused by viral infection currently given her current symptoms.   I have explained that in viral URIS, an antibiotic will not help the symptoms and will increase the risk of developing diarrhea. Advised to use oral and nasal decongestants,  Ibuprofen 400 mg and tylenol 650 mq 8 hrs for aches and pains,  tessalon every 8 hours prn cough  Advised to start round of abx only if symptoms worsen to include fevers, facial pain, purulent sputum./drainage.  

## 2013-03-15 ENCOUNTER — Ambulatory Visit (INDEPENDENT_AMBULATORY_CARE_PROVIDER_SITE_OTHER): Payer: BC Managed Care – PPO | Admitting: Internal Medicine

## 2013-03-15 ENCOUNTER — Encounter: Payer: Self-pay | Admitting: Internal Medicine

## 2013-03-15 VITALS — BP 120/80 | HR 66 | Temp 98.8°F | Resp 18 | Wt 111.8 lb

## 2013-03-15 DIAGNOSIS — F5105 Insomnia due to other mental disorder: Secondary | ICD-10-CM

## 2013-03-15 DIAGNOSIS — F411 Generalized anxiety disorder: Secondary | ICD-10-CM

## 2013-03-15 DIAGNOSIS — C44519 Basal cell carcinoma of skin of other part of trunk: Secondary | ICD-10-CM

## 2013-03-15 DIAGNOSIS — F489 Nonpsychotic mental disorder, unspecified: Secondary | ICD-10-CM

## 2013-03-15 DIAGNOSIS — D045 Carcinoma in situ of skin of trunk: Secondary | ICD-10-CM

## 2013-03-15 MED ORDER — ALPRAZOLAM 1 MG PO TABS: 1.0000 mg | ORAL_TABLET | Freq: Two times a day (BID) | ORAL | Status: DC | PRN

## 2013-03-15 MED ORDER — SERTRALINE HCL 100 MG PO TABS: 100.0000 mg | ORAL_TABLET | Freq: Every day | ORAL | Status: DC
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
## 2013-03-15 NOTE — Patient Instructions (Addendum)
   I am increasing the sertraline dose from 50 mg to 100 mg daily   I am increase the alprazolam to 1 mg dose to use AS NEEDED for panic attacks, daily

## 2013-03-16 ENCOUNTER — Encounter: Payer: Self-pay | Admitting: Internal Medicine

## 2013-03-16 DIAGNOSIS — D045 Carcinoma in situ of skin of trunk: Secondary | ICD-10-CM | POA: Insufficient documentation

## 2013-03-16 NOTE — Assessment & Plan Note (Signed)
Excised by Clent Ridges in March. Healthy granulating tissue noted in the wound bed. No signs of infection.

## 2013-03-16 NOTE — Assessment & Plan Note (Signed)
Managed with amitriptyline 100 mg daily. No changes today.

## 2013-03-16 NOTE — Assessment & Plan Note (Signed)
With insomnia and frequent panic attacks. We're increasing the Zoloft today to 100 mg daily. Increasing the alprazolam dose to 1 mg every 12 hours as needed for panic disorder. I have discussed the use of this drug with patient again today regarding the risk of addiction if use escalates to daily use   She is not using it on a daily basis right now.

## 2013-03-16 NOTE — Assessment & Plan Note (Addendum)
Status post excision by Dr. Johnny Bridge  in early March with reexcision for wider margins done last week. Patient has a healthy granulating tissue bed with some slough noted. She states that the size of the excision has already decreased in severity since last week. Instructed to call if she develops any redness or purulent appearing drainage.

## 2013-04-09 ENCOUNTER — Other Ambulatory Visit: Payer: Self-pay | Admitting: Internal Medicine

## 2013-05-21 ENCOUNTER — Other Ambulatory Visit: Payer: Self-pay | Admitting: Internal Medicine

## 2013-05-22 NOTE — Telephone Encounter (Signed)
Please Advise

## 2013-07-09 ENCOUNTER — Other Ambulatory Visit: Payer: Self-pay | Admitting: Internal Medicine

## 2013-07-17 LAB — HM PAP SMEAR: HM Pap smear: NORMAL

## 2013-07-25 ENCOUNTER — Other Ambulatory Visit: Payer: Self-pay | Admitting: Internal Medicine

## 2013-07-26 NOTE — Telephone Encounter (Signed)
Last visit 4/14

## 2013-07-27 NOTE — Telephone Encounter (Signed)
Rx phoned to pharmacy.  

## 2013-08-10 ENCOUNTER — Encounter: Payer: Self-pay | Admitting: *Deleted

## 2013-08-10 ENCOUNTER — Telehealth: Payer: Self-pay | Admitting: *Deleted

## 2013-08-10 DIAGNOSIS — R5381 Other malaise: Secondary | ICD-10-CM

## 2013-08-10 DIAGNOSIS — E785 Hyperlipidemia, unspecified: Secondary | ICD-10-CM

## 2013-08-10 NOTE — Telephone Encounter (Signed)
Pt is coming in tomorrow 08.29.2014 what lab and dx?

## 2013-08-11 ENCOUNTER — Other Ambulatory Visit (INDEPENDENT_AMBULATORY_CARE_PROVIDER_SITE_OTHER): Payer: BC Managed Care – PPO

## 2013-08-11 DIAGNOSIS — E785 Hyperlipidemia, unspecified: Secondary | ICD-10-CM

## 2013-08-11 DIAGNOSIS — R5381 Other malaise: Secondary | ICD-10-CM

## 2013-08-11 LAB — COMPREHENSIVE METABOLIC PANEL
ALT: 10 U/L (ref 0–35)
Albumin: 3.9 g/dL (ref 3.5–5.2)
Alkaline Phosphatase: 74 U/L (ref 39–117)
CO2: 25 mEq/L (ref 19–32)
GFR: 92.47 mL/min (ref >60.00)
Glucose, Bld: 92 mg/dL (ref 70–99)
Potassium: 3.9 mEq/L (ref 3.5–5.1)
Sodium: 139 mEq/L (ref 135–145)
Total Bilirubin: 0.4 mg/dL (ref 0.3–1.2)
Total Protein: 6.5 g/dL (ref 6.0–8.3)

## 2013-08-11 LAB — CBC WITH DIFFERENTIAL/PLATELET
Basophils Absolute: 0 10*3/uL (ref 0.0–0.1)
Eosinophils Relative: 3.1 % (ref 0.0–5.0)
HCT: 40 % (ref 36.0–46.0)
Lymphs Abs: 1.9 10*3/uL (ref 0.7–4.0)
Monocytes Relative: 6.5 % (ref 3.0–12.0)
Neutrophils Relative %: 68 % (ref 43.0–77.0)
Platelets: 236 10*3/uL (ref 150.0–400.0)
RDW: 13.3 % (ref 11.5–14.6)
WBC: 8.4 10*3/uL (ref 4.5–10.5)

## 2013-08-11 LAB — LIPID PANEL
HDL: 60.3 mg/dL (ref >39.00)
Total CHOL/HDL Ratio: 3
VLDL: 9.8 mg/dL (ref 0.0–40.0)

## 2013-08-11 LAB — TSH: TSH: 1.35 u[IU]/mL (ref 0.35–5.50)

## 2013-08-15 ENCOUNTER — Ambulatory Visit (INDEPENDENT_AMBULATORY_CARE_PROVIDER_SITE_OTHER): Payer: BC Managed Care – PPO | Admitting: Internal Medicine

## 2013-08-15 ENCOUNTER — Encounter: Payer: Self-pay | Admitting: *Deleted

## 2013-08-15 ENCOUNTER — Other Ambulatory Visit (HOSPITAL_COMMUNITY)
Admission: RE | Admit: 2013-08-15 | Discharge: 2013-08-15 | Disposition: A | Discharge status: Home / Self Care | Payer: BC Managed Care – PPO | Source: Ambulatory Visit | Attending: Internal Medicine | Admitting: Internal Medicine

## 2013-08-15 ENCOUNTER — Encounter: Payer: Self-pay | Admitting: Internal Medicine

## 2013-08-15 VITALS — BP 122/72 | HR 74 | Temp 98.3°F | Resp 12 | Ht 61.0 in | Wt 106.8 lb

## 2013-08-15 DIAGNOSIS — Z0000 Encounter for general adult medical examination without abnormal findings: Secondary | ICD-10-CM

## 2013-08-15 DIAGNOSIS — Z1239 Encounter for other screening for malignant neoplasm of breast: Secondary | ICD-10-CM

## 2013-08-15 DIAGNOSIS — L0291 Cutaneous abscess, unspecified: Secondary | ICD-10-CM

## 2013-08-15 DIAGNOSIS — Z124 Encounter for screening for malignant neoplasm of cervix: Secondary | ICD-10-CM

## 2013-08-15 DIAGNOSIS — Z720 Tobacco use: Secondary | ICD-10-CM

## 2013-08-15 DIAGNOSIS — F17200 Nicotine dependence, unspecified, uncomplicated: Secondary | ICD-10-CM

## 2013-08-15 DIAGNOSIS — Z1151 Encounter for screening for human papillomavirus (HPV): Secondary | ICD-10-CM | POA: Insufficient documentation

## 2013-08-15 DIAGNOSIS — Z01419 Encounter for gynecological examination (general) (routine) without abnormal findings: Secondary | ICD-10-CM | POA: Insufficient documentation

## 2013-08-15 DIAGNOSIS — L0390 Cellulitis, unspecified: Secondary | ICD-10-CM | POA: Insufficient documentation

## 2013-08-15 DIAGNOSIS — Z23 Encounter for immunization: Secondary | ICD-10-CM

## 2013-08-15 MED ORDER — DOXYCYCLINE HYCLATE 100 MG PO TABS: 100.0000 mg | ORAL_TABLET | Freq: Two times a day (BID) | ORAL | Status: DC

## 2013-08-15 MED ORDER — HYDROXYZINE HCL 50 MG PO TABS: 50.0000 mg | ORAL_TABLET | Freq: Three times a day (TID) | ORAL | Status: DC | PRN

## 2013-08-15 MED ORDER — PREDNISONE 10 MG PO TABS: ORAL_TABLET | ORAL | Status: DC

## 2013-08-15 NOTE — Patient Instructions (Addendum)
Prednisone and doxycycline for one week   Atarax for itching  Mammogram to be set up  PAP smear done today; we'll contact you with the results   Your labs were normal   You recieved the TDaP vaccine today
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
## 2013-08-17 ENCOUNTER — Encounter: Payer: Self-pay | Admitting: Internal Medicine

## 2013-08-17 DIAGNOSIS — Z0000 Encounter for general adult medical examination without abnormal findings: Secondary | ICD-10-CM | POA: Insufficient documentation

## 2013-08-17 DIAGNOSIS — Z0001 Encounter for general adult medical examination with abnormal findings: Secondary | ICD-10-CM | POA: Insufficient documentation

## 2013-08-17 NOTE — Assessment & Plan Note (Signed)
Risks of continued tobacco use were discussed. She is not currently interested in tobacco cessation.    

## 2013-08-17 NOTE — Assessment & Plan Note (Signed)
Her persistent itching and exam is consistent with cellulitis. Will treat for presumed staph infection with doxycycline and a prednisone taper and Atarax for itching.

## 2013-08-17 NOTE — Assessment & Plan Note (Signed)
Annual comprehensive exam was done including breast, pelvic and PAP smear. All screenings have been addressed .  

## 2013-09-19 ENCOUNTER — Encounter: Payer: Self-pay | Admitting: Emergency Medicine

## 2013-10-19 ENCOUNTER — Other Ambulatory Visit: Payer: Self-pay

## 2013-11-06 ENCOUNTER — Other Ambulatory Visit: Payer: Self-pay | Admitting: Internal Medicine

## 2013-11-06 NOTE — Telephone Encounter (Signed)
Last visit 08/15/13, ok to refill?

## 2014-02-02 ENCOUNTER — Other Ambulatory Visit: Payer: Self-pay | Admitting: Internal Medicine

## 2014-02-27 ENCOUNTER — Telehealth: Payer: Self-pay | Admitting: *Deleted

## 2014-02-27 MED ORDER — ALPRAZOLAM 1 MG PO TABS: ORAL_TABLET | ORAL | Status: DC

## 2014-02-27 NOTE — Telephone Encounter (Signed)
Script faxed to pharmacy and script sent.

## 2014-02-27 NOTE — Telephone Encounter (Signed)
Patient called back and scheduled follow up for medication refills 03/06/14 ask if you would allow enough to last til appointment please advise.

## 2014-02-27 NOTE — Telephone Encounter (Signed)
Patient requesting refill on alprazolam last OV 9/14 left detailed message for patient that control substances require 3 month follow up, and may need to schedule appointment for refill.

## 2014-02-27 NOTE — Telephone Encounter (Signed)
Ok to frell for 30 days  Alprazolam printed

## 2014-02-27 NOTE — Telephone Encounter (Signed)
Patient left message needing refill need to know which medication and the new pharmacy she wants medication sent, Called left message for patient to return call.

## 2014-02-28 ENCOUNTER — Other Ambulatory Visit: Payer: Self-pay | Admitting: Internal Medicine

## 2014-03-02 ENCOUNTER — Encounter: Payer: Self-pay | Admitting: *Deleted

## 2014-03-05 ENCOUNTER — Encounter: Payer: Self-pay | Admitting: Internal Medicine

## 2014-03-05 ENCOUNTER — Ambulatory Visit (INDEPENDENT_AMBULATORY_CARE_PROVIDER_SITE_OTHER): Payer: 59 | Admitting: Internal Medicine

## 2014-03-05 VITALS — BP 134/78 | HR 70 | Temp 98.2°F | Resp 16 | Wt 111.5 lb

## 2014-03-05 DIAGNOSIS — F5105 Insomnia due to other mental disorder: Secondary | ICD-10-CM

## 2014-03-05 DIAGNOSIS — F419 Anxiety disorder, unspecified: Secondary | ICD-10-CM

## 2014-03-05 DIAGNOSIS — Z720 Tobacco use: Secondary | ICD-10-CM

## 2014-03-05 DIAGNOSIS — F489 Nonpsychotic mental disorder, unspecified: Secondary | ICD-10-CM

## 2014-03-05 DIAGNOSIS — F17200 Nicotine dependence, unspecified, uncomplicated: Secondary | ICD-10-CM

## 2014-03-05 DIAGNOSIS — F411 Generalized anxiety disorder: Secondary | ICD-10-CM

## 2014-03-05 MED ORDER — ALPRAZOLAM 1 MG PO TABS: ORAL_TABLET | ORAL | Status: DC

## 2014-03-05 MED ORDER — BUSPIRONE HCL 5 MG PO TABS: 5.0000 mg | ORAL_TABLET | Freq: Three times a day (TID) | ORAL | Status: DC

## 2014-03-05 NOTE — Progress Notes (Signed)
Pre-visit discussion using our clinic review tool. No additional management support is needed unless otherwise documented below in the visit note.  

## 2014-03-05 NOTE — Progress Notes (Signed)
Patient ID: Sherry Petersen, female   DOB: 10-19-1961, 53 y.o.   MRN: [id number]  Patient Active Problem List   Diagnosis Date Noted  . Routine general medical examination at a health care facility 08/17/2013  . 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 Basal cell carcinoma of chest wall 02/12/2013  . Generalized anxiety disorder   . Insomnia secondary to anxiety 06/30/2012  . Carpal tunnel syndrome of left wrist 06/29/2012  . Constipation 06/29/2012  . Tobacco abuse 06/29/2012  . Neuralgia of upper extremity 02/23/2012  . Arthritis 02/23/2012    Subjective:  CC:   Chief Complaint  Patient presents with  . Follow-up    medication refill    HPI:   Sherry Petersen is a 53 y.o. female who presents for Follow up on chronic anxiety/insomnia, and tobacco abuse,  She has had major financial stressors over the past 6 months. Husband has severe ischemic cardiomyopathy and because of his AICD/pacer placement was involuntarily retired  From his position at the hospital.  they have lost their home and have been living in a camper which is heated and has indoor plumbing . Using alprazolam 1/2 tablet up to four times daily, sertraline 100 mg daily, and using amitryptiline for insomnia.  Still smoking.  No attempts to quit recently due to stressors present.    Discussed addition of buspirone  Today to decrease her dependence on alprazolam    Past Medical History  Diagnosis Date  . Hematuria     CT Scan normal  . Anxiety     Past Surgical History  Procedure Laterality Date  . Carpal tunnel release  2003  . Vaginal hysterectomy  2002  . Tonsillectomy  2012       The following portions of the patient's history were reviewed and updated as appropriate: Allergies, current medications, and problem list.    Review of Systems:   Patient denies headache, fevers, malaise, unintentional weight loss, skin rash, eye pain, sinus congestion and sinus pain, sore  throat, dysphagia,  hemoptysis , cough, dyspnea, wheezing, chest pain, palpitations, orthopnea, edema, abdominal pain, nausea, melena, diarrhea, constipation, flank pain, dysuria, hematuria, urinary  Frequency, nocturia, numbness, tingling, seizures,  Focal weakness, Loss of consciousness,  Tremor, insomnia, depression, anxiety, and suicidal ideation.     History   Social History  . Marital Status: Married    Spouse Name: N/A    Number of Children: N/A  . Years of Education: N/A   Occupational History  . Not on file.   Social History Main Topics  . Smoking status: Current Some Day Smoker    Types: Cigarettes  . Smokeless tobacco: Never Used     Comment: smokes 4-5 cigs per day  . Alcohol Use: No     Comment: never  . Drug Use: No  . Sexual Activity: Not on file   Other Topics Concern  . Not on file   Social History Narrative  . No narrative on file    Objective:  Filed Vitals:   03/05/14 1452  BP: 134/78  Pulse: 70  Temp: 98.2 F (36.8 C)  Resp: 16     General appearance: alert, cooperative and appears stated age Ears: normal TM's and external ear canals both ears Throat: lips, mucosa, and tongue normal; teeth and gums normal Neck: no adenopathy, no carotid bruit, supple, symmetrical, trachea midline and thyroid not enlarged, symmetric, no tenderness/mass/nodules Back: symmetric, no curvature. ROM normal. No CVA tenderness.  Lungs: clear to auscultation bilaterally Heart: regular rate and rhythm, S1, S2 normal, no murmur, click, rub or gallop Abdomen: soft, non-tender; bowel sounds normal; no masses,  no organomegaly Pulses: 2+ and symmetric Skin: Skin color, texture, turgor normal. No rashes or lesions Lymph nodes: Cervical, supraclavicular, and axillary nodes normal.  Assessment and Plan:  Generalized anxiety disorder Aggravated by financial stressors including loss of home.  Discussed her use of alprazolam 4 times daily and the increased risk of withdrawal  seizures  due to dependence.  Recommended adding buspar and tapering off of zoloft with goal of reducing alprazolam use to tid after two weeks  and bid after one month. I have recently refilled her alprazolam for #60 but her refill for April 17th will be for #45 .  She will need to return prior to any  Subsequent refills to discuss use   Tobacco abuse Spent 3 minutes discussing risk of continued tobacco abuse, including but not limited to CAD, PAD, hypertension, and CA.  She is not interested in pharmacotherapy at this time.  Insomnia secondary to anxiety Managed with amitriptyline currently and 1/2 alprazolam tablet.  Encouraged to try 3rd dose of buspirone at bedtime   Updated Medication List Outpatient Encounter Prescriptions as of 03/05/2014  Medication Sig  . ALPRAZolam (XANAX) 1 MG tablet take 1/2  tablet by mouth three times daily as needed for anxiety  . amitriptyline (ELAVIL) 100 MG tablet take 1 tablet by mouth at bedtime  . cholecalciferol (VITAMIN D) 1000 UNITS tablet Take 5,000 Units by mouth daily.  . [DISCONTINUED] ALPRAZolam (XANAX) 1 MG tablet take 1 tablet by mouth twice a day if needed for sleep or anxiety  . [DISCONTINUED] ALPRAZolam (XANAX) 1 MG tablet take 1 tablet by mouth twice a day if needed for sleep or anxiety  . [DISCONTINUED] sertraline (ZOLOFT) 100 MG tablet Take 1 tablet (100 mg total) by mouth daily.  . busPIRone (BUSPAR) 5 MG tablet Take 1 tablet (5 mg total) by mouth 3 (three) times daily.  . hydrOXYzine (ATARAX/VISTARIL) 50 MG tablet Take 1 tablet (50 mg total) by mouth 3 (three) times daily as needed for itching.  . [DISCONTINUED] doxycycline (VIBRA-TABS) 100 MG tablet Take 1 tablet (100 mg total) by mouth 2 (two) times daily.  . [DISCONTINUED] HYDROcodone-homatropine (HYCODAN) 5-1.5 MG/5ML syrup Take 5 mLs by mouth every 6 (six) hours as needed for cough.  . [DISCONTINUED] predniSONE (DELTASONE) 10 MG tablet 6 tablets on Day 1 , then reduce by 1 tablet daily  until gone  . [DISCONTINUED] traMADol (ULTRAM) 50 MG tablet Take 50 mg by mouth every 6 (six) hours as needed.     No orders of the defined types were placed in this encounter.    Return in about 6 months (around 09/05/2014).

## 2014-03-05 NOTE — Patient Instructions (Signed)
I am concerned about your use of alprazolam for anxiety management.    I am changing your zoloft to buspirone to help manage your anxiety with less use of alprazolam., because alprazolam is addicting   Start the buspirone 5 mg twice daily (can add 3rd evening dose if needed )  Reduce the Zoloft after one week to 1/2 tablet daily, for one week,,  Then 1/2 tablet every other day for one week,  Then stop (requires 12 tablets total)   Reduce your alprazolam to three times daily after Week Two, and bu the end of the first month I would like you to reduce your alprazolam to 1/2 tablet twice daily

## 2014-03-06 ENCOUNTER — Telehealth: Payer: Self-pay | Admitting: Internal Medicine

## 2014-03-06 ENCOUNTER — Encounter: Payer: Self-pay | Admitting: Internal Medicine

## 2014-03-06 NOTE — Assessment & Plan Note (Addendum)
Managed with amitriptyline currently and 1/2 alprazolam tablet.  Encouraged to try 3rd dose of buspirone at bedtime

## 2014-03-06 NOTE — Telephone Encounter (Signed)
Relevant patient education assigned to patient using Emmi. ° °

## 2014-03-06 NOTE — Assessment & Plan Note (Signed)
Aggravated by financial stressors including loss of home.  Discussed her use of alprazolam 4 times daily and the increased risk of withdrawal seizures  due to dependence.  Recommended adding buspar and tapering off of zoloft with goal of reducing alprazolam use to tid after two weeks  and bid after one month. I have recently refilled her alprazolam for #60 but her refill for April 17th will be for #45 .  She will need to return prior to any  Subsequent refills to discuss use

## 2014-03-06 NOTE — Assessment & Plan Note (Signed)
Spent 3 minutes discussing risk of continued tobacco abuse, including but not limited to CAD, PAD, hypertension, and CA.  She is not interested in pharmacotherapy at this time. 

## 2014-05-30 ENCOUNTER — Other Ambulatory Visit: Payer: Self-pay | Admitting: Internal Medicine

## 2014-05-30 ENCOUNTER — Encounter: Payer: Self-pay | Admitting: Internal Medicine

## 2014-05-30 ENCOUNTER — Ambulatory Visit (INDEPENDENT_AMBULATORY_CARE_PROVIDER_SITE_OTHER): Payer: 59 | Admitting: Internal Medicine

## 2014-05-30 VITALS — BP 130/62 | HR 65 | Temp 99.0°F | Resp 16 | Ht 61.0 in | Wt 106.5 lb

## 2014-05-30 DIAGNOSIS — F5105 Insomnia due to other mental disorder: Secondary | ICD-10-CM

## 2014-05-30 DIAGNOSIS — R002 Palpitations: Secondary | ICD-10-CM

## 2014-05-30 DIAGNOSIS — F419 Anxiety disorder, unspecified: Secondary | ICD-10-CM

## 2014-05-30 DIAGNOSIS — E538 Deficiency of other specified B group vitamins: Secondary | ICD-10-CM

## 2014-05-30 DIAGNOSIS — F489 Nonpsychotic mental disorder, unspecified: Secondary | ICD-10-CM

## 2014-05-30 DIAGNOSIS — R634 Abnormal weight loss: Secondary | ICD-10-CM

## 2014-05-30 DIAGNOSIS — K5909 Other constipation: Secondary | ICD-10-CM

## 2014-05-30 DIAGNOSIS — F411 Generalized anxiety disorder: Secondary | ICD-10-CM

## 2014-05-30 MED ORDER — ALPRAZOLAM 1 MG PO TABS: 1.0000 mg | ORAL_TABLET | Freq: Every evening | ORAL | Status: DC | PRN

## 2014-05-30 MED ORDER — LINACLOTIDE 290 MCG PO CAPS
290.0000 ug | ORAL_CAPSULE | Freq: Every day | ORAL | Status: DC
Start: 2014-05-30 — End: 2015-01-24

## 2014-05-30 MED ORDER — AMITRIPTYLINE HCL 50 MG PO TABS: 50.0000 mg | ORAL_TABLET | Freq: Every day | ORAL | Status: DC

## 2014-05-30 MED ORDER — LACTULOSE 20 GM/30ML PO SOLN: 20.0000 g | ORAL | Status: DC | PRN

## 2014-05-30 NOTE — Patient Instructions (Addendum)
I want to stop your amitriptyline gradually over a 4 week taper .  It may be causing your constipation   Week 1  50 mg daily for one week,    Week 2: 50 mg every other day or 1/2 tablet daily for one week if they are scored)  Week 3:  1/2 tablet or 25 mg every other day for one week,   STOP  I am prescribing xanax for your sleep issues   You can use the lactulose every 3 days to relieve your constipation  If the constipation is not relieved with lactulose,  It may be worth trying the Linzess which is much more $$$$$$$$

## 2014-05-30 NOTE — Progress Notes (Signed)
Pre-visit discussion using our clinic review tool. No additional management support is needed unless otherwise documented below in the visit note.  

## 2014-05-30 NOTE — Progress Notes (Signed)
Patient ID: Sherry Petersen, female   DOB: 07-May-1961, 53 y.o.   MRN: [id number]  Not feeling well.  "My  Heart feels funny"  Speeds up, then feels light headed. Has been occurring intermittently for the past month. Occurs with rest and activity ,  Has occurred with walking but does nothing more strenuous than walking .  History of chronic anxiety ,  Insomnia and tobacco abuse complicated by financial stressors  Chronic constipation .  Severe,  Causing vomiting .  Has had prior EGD /colonosvopy by Dionne Milo were done  Gastric erosions were noted,  But she has not been taking a PPI

## 2014-05-31 ENCOUNTER — Encounter: Payer: Self-pay | Admitting: Internal Medicine

## 2014-05-31 DIAGNOSIS — E538 Deficiency of other specified B group vitamins: Secondary | ICD-10-CM | POA: Insufficient documentation

## 2014-05-31 LAB — COMPREHENSIVE METABOLIC PANEL
ALK PHOS: 80 U/L (ref 39–117)
ALT: 14 U/L (ref 0–35)
AST: 20 U/L (ref 0–37)
Albumin: 4.3 g/dL (ref 3.5–5.2)
BILIRUBIN TOTAL: 0.3 mg/dL (ref 0.2–1.2)
BUN: 10 mg/dL (ref 6–23)
CO2: 27 meq/L (ref 19–32)
CREATININE: 0.7 mg/dL (ref 0.4–1.2)
Calcium: 9.8 mg/dL (ref 8.4–10.5)
Chloride: 107 mEq/L (ref 96–112)
GFR: 89.25 mL/min (ref >60.00)
Glucose, Bld: 92 mg/dL (ref 70–99)
Potassium: 3.8 mEq/L (ref 3.5–5.1)
SODIUM: 142 meq/L (ref 135–145)
TOTAL PROTEIN: 6.5 g/dL (ref 6.0–8.3)

## 2014-05-31 LAB — MAGNESIUM: Magnesium: 2 mg/dL (ref 1.5–2.5)

## 2014-05-31 LAB — VITAMIN B12: VITAMIN B 12: 299 pg/mL (ref 211–911)

## 2014-05-31 LAB — TSH: TSH: 1.19 u[IU]/mL (ref 0.35–4.50)

## 2014-05-31 NOTE — Assessment & Plan Note (Signed)
Borderline.  Oral sublingual supplementation advised.

## 2014-06-02 DIAGNOSIS — R002 Palpitations: Secondary | ICD-10-CM | POA: Insufficient documentation

## 2014-06-02 NOTE — Assessment & Plan Note (Addendum)
Thyroid, lytes all normal.  Given her tobacco abuse and anxiety,  DDX includes SVT and atrial fib.  EKG normal today.  Refer to cardiology for Holter monitor

## 2014-06-02 NOTE — Assessment & Plan Note (Addendum)
May be secondary to chronic use of amitriptyline. Discussed suspending it with a  3 week taper written out.  rx for lactulose for prn use not more than  2/week.  Adding lactulose for prn use not more than 2/week

## 2014-06-02 NOTE — Assessment & Plan Note (Signed)
Adding buspirone for management of anxiety  

## 2014-06-02 NOTE — Assessment & Plan Note (Signed)
Suspending elavil due to constipation.  Trial of alprazolam

## 2014-06-04 ENCOUNTER — Telehealth: Payer: Self-pay | Admitting: Internal Medicine

## 2014-06-04 NOTE — Telephone Encounter (Signed)
Received fax from [facility], [place] approved thru 12/04/14

## 2014-06-04 NOTE — Telephone Encounter (Signed)
Prior authorization placed in red folder.

## 2014-06-05 DIAGNOSIS — Z0279 Encounter for issue of other medical certificate: Secondary | ICD-10-CM

## 2014-06-06 NOTE — Telephone Encounter (Signed)
Mailed unread message to pt  

## 2014-06-12 ENCOUNTER — Other Ambulatory Visit: Payer: Self-pay | Admitting: Internal Medicine

## 2014-06-22 ENCOUNTER — Ambulatory Visit: Payer: 59 | Admitting: Internal Medicine

## 2014-06-30 ENCOUNTER — Other Ambulatory Visit: Payer: Self-pay | Admitting: Internal Medicine

## 2014-07-02 NOTE — Telephone Encounter (Signed)
Last refill 6.17.15, last OV 6.17.15, next OV 7.21.15.  Please advise refill.

## 2014-07-03 ENCOUNTER — Encounter: Payer: Self-pay | Admitting: *Deleted

## 2014-07-03 ENCOUNTER — Encounter: Payer: Self-pay | Admitting: Internal Medicine

## 2014-07-03 ENCOUNTER — Other Ambulatory Visit: Payer: Self-pay | Admitting: Internal Medicine

## 2014-07-03 ENCOUNTER — Ambulatory Visit (INDEPENDENT_AMBULATORY_CARE_PROVIDER_SITE_OTHER): Payer: 59 | Admitting: Internal Medicine

## 2014-07-03 VITALS — BP 138/68 | HR 64 | Temp 98.4°F | Resp 16 | Ht 61.0 in | Wt 102.5 lb

## 2014-07-03 DIAGNOSIS — F17200 Nicotine dependence, unspecified, uncomplicated: Secondary | ICD-10-CM

## 2014-07-03 DIAGNOSIS — Z720 Tobacco use: Secondary | ICD-10-CM

## 2014-07-03 DIAGNOSIS — F489 Nonpsychotic mental disorder, unspecified: Secondary | ICD-10-CM

## 2014-07-03 DIAGNOSIS — K5900 Constipation, unspecified: Secondary | ICD-10-CM

## 2014-07-03 DIAGNOSIS — R634 Abnormal weight loss: Secondary | ICD-10-CM

## 2014-07-03 DIAGNOSIS — R21 Rash and other nonspecific skin eruption: Secondary | ICD-10-CM

## 2014-07-03 DIAGNOSIS — K5909 Other constipation: Secondary | ICD-10-CM

## 2014-07-03 DIAGNOSIS — F5105 Insomnia due to other mental disorder: Secondary | ICD-10-CM

## 2014-07-03 DIAGNOSIS — R002 Palpitations: Secondary | ICD-10-CM

## 2014-07-03 DIAGNOSIS — F411 Generalized anxiety disorder: Secondary | ICD-10-CM

## 2014-07-03 DIAGNOSIS — F419 Anxiety disorder, unspecified: Secondary | ICD-10-CM

## 2014-07-03 DIAGNOSIS — E785 Hyperlipidemia, unspecified: Secondary | ICD-10-CM

## 2014-07-03 MED ORDER — ONDANSETRON HCL 4 MG PO TABS: 4.0000 mg | ORAL_TABLET | Freq: Three times a day (TID) | ORAL | Status: DC | PRN

## 2014-07-03 MED ORDER — FLUOXETINE HCL 10 MG PO CAPS: 10.0000 mg | ORAL_CAPSULE | Freq: Every day | ORAL | Status: DC

## 2014-07-03 MED ORDER — ALPRAZOLAM 1 MG PO TABS: 1.0000 mg | ORAL_TABLET | Freq: Every evening | ORAL | Status: DC | PRN

## 2014-07-03 NOTE — Progress Notes (Signed)
Pre-visit discussion using our clinic review tool. No additional management support is needed unless otherwise documented below in the visit note.  

## 2014-07-03 NOTE — Assessment & Plan Note (Signed)
Resuming prozac 10 mg daily

## 2014-07-03 NOTE — Assessment & Plan Note (Signed)
Decreased use due to work and home conditions

## 2014-07-03 NOTE — Patient Instructions (Signed)
I am prescribing generic prozac to help you manage your anxiety  I am prescribing zofran to keep on hand for nausea. (odansetron).  Please make your annual exam appt early in the AM and we can do labs same day or prior if preferred  Referral to Dr Rockey Situ for evaluation of persistent palpitations is in progress

## 2014-07-03 NOTE — Progress Notes (Signed)
Patient ID: Sherry Petersen, female   DOB: Sep 24, 1961, 53 y.o.   MRN: [id number]  Patient Active Problem List   Diagnosis Date Noted  . Palpitations 06/02/2014  . B12 deficiency 05/31/2014  . Loss of weight 05/30/2014  . Routine general medical examination at a health care facility 08/17/2013  . 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 Basal cell carcinoma of chest wall 02/12/2013  . Generalized anxiety disorder   . Insomnia secondary to anxiety 06/30/2012  . Carpal tunnel syndrome of left wrist 06/29/2012  . Constipation, chronic 06/29/2012  . Tobacco abuse 06/29/2012  . Neuralgia of upper extremity 02/23/2012  . Arthritis 02/23/2012    Subjective:  CC:   Chief Complaint  Patient presents with  . Follow-up    palpitations still come and go feels fatigued when this happens.    HPI:   Sherry Petersen is a 53 y.o. female who presents for  Follow up on palpitations and constipation   Her constipation has improved since she stopped taking elavil at bedtime.  Had a nocturnal episode of sudden onset nausea and itching which occurred  In late June   Took benadryl , followed by a shower and then started vomiting. Was treated in [facility] ER and sent home without labs or follow up.  She denies the occurrence of a rash but states that she was diffusely red.  It has not occurred.   She continues to have episodes of palpitations occurring mostly in the evenings, which cause her to feel dizzy and presyncopal . She denies chest pain.  She has a history of GAD and tobacco abuse , ongoing, along with wt loss.  electrolytes and tsh were normal last month.    Past Medical History  Diagnosis Date  . Hematuria     CT Scan normal  . Anxiety     Past Surgical History  Procedure Laterality Date  . Carpal tunnel release  2003  . Vaginal hysterectomy  2002  . Tonsillectomy  2012       The following portions of the patient's history were reviewed and  updated as appropriate: Allergies, current medications, and problem list.    Review of Systems:   Patient denies headache, fevers, malaise, unintentional weight loss, skin rash, eye pain, sinus congestion and sinus pain, sore throat, dysphagia,  hemoptysis , cough, dyspnea, wheezing, chest pain, palpitations, orthopnea, edema, abdominal pain, nausea, melena, diarrhea, constipation, flank pain, dysuria, hematuria, urinary  Frequency, nocturia, numbness, tingling, seizures,  Focal weakness, Loss of consciousness,  Tremor, insomnia, depression, anxiety, and suicidal ideation.     History   Social History  . Marital Status: Married    Spouse Name: N/A    Number of Children: N/A  . Years of Education: N/A   Occupational History  . Not on file.   Social History Main Topics  . Smoking status: Current Some Day Smoker    Types: Cigarettes  . Smokeless tobacco: Never Used     Comment: smokes 4-5 cigs per day  . Alcohol Use: No     Comment: never  . Drug Use: No  . Sexual Activity: Not on file   Other Topics Concern  . Not on file   Social History Narrative  . No narrative on file    Objective:  Filed Vitals:   07/03/14 1410  BP: 138/68  Pulse: 64  Temp: 98.4 F (36.9 C)  Resp: 16     General appearance:  alert, cooperative and appears stated age Ears: normal TM's and external ear canals both ears Throat: lips, mucosa, and tongue normal; teeth and gums normal Neck: no adenopathy, no carotid bruit, supple, symmetrical, trachea midline and thyroid not enlarged, symmetric, no tenderness/mass/nodules Back: symmetric, no curvature. ROM normal. No CVA tenderness. Lungs: clear to auscultation bilaterally Heart: regular rate and rhythm, S1, S2 normal, no murmur, click, rub or gallop Abdomen: soft, non-tender; bowel sounds normal; no masses,  no organomegaly Pulses: 2+ and symmetric Skin: Skin color, texture, turgor normal. No rashes or lesions Lymph nodes: Cervical,  supraclavicular, and axillary nodes normal.  Assessment and Plan:  Tobacco abuse Decreased use due to work and home conditions  Generalized anxiety disorder Resuming prozac 10 mg daily   Palpitations Consider SVT vs PAF given history of tobacco abuse. She becomes dizzy with epiosdes so Referral to Cardiology for Holter monitor was advised.  Loss of weight 9 lbs since March, unintentional. Pending cardiology evaluation will need to consider occult malignancy  Lab Results  Component Value Date   TSH 1.19 05/30/2014    Constipation, chronic Improved with suspension of elavil . Lytes and thyroid function are normal.   Insomnia secondary to anxiety Using buspirone and hydroxyzine since Elavil has been dc'd   Updated Medication List Outpatient Encounter Prescriptions as of 07/03/2014  Medication Sig  . ALPRAZolam (XANAX) 1 MG tablet Take 1 tablet (1 mg total) by mouth at bedtime as needed for anxiety.  . cholecalciferol (VITAMIN D) 1000 UNITS tablet Take 5,000 Units by mouth daily.  . hydrOXYzine (ATARAX/VISTARIL) 50 MG tablet Take 1 tablet (50 mg total) by mouth 3 (three) times daily as needed for itching.  . Lactulose 20 GM/30ML SOLN Take 30 mLs (20 g total) by mouth every three (3) days as needed. As needed for severe constipation  . [DISCONTINUED] ALPRAZolam (XANAX) 1 MG tablet Take 1 tablet (1 mg total) by mouth at bedtime as needed for anxiety.  . [DISCONTINUED] ALPRAZolam (XANAX) 1 MG tablet Take 1 tablet (1 mg total) by mouth at bedtime as needed for anxiety. take 1/2  tablet by mouth three times daily as needed for anxiety  . [DISCONTINUED] amitriptyline (ELAVIL) 100 MG tablet TAKE ONE TABLET BY MOUTH EVERY NIGHT AT BEDTIME  . busPIRone (BUSPAR) 5 MG tablet Take 1 tablet (5 mg total) by mouth 3 (three) times daily.  Marland Kitchen FLUoxetine (PROZAC) 10 MG capsule Take 1 capsule (10 mg total) by mouth daily.  . Linaclotide (LINZESS) 290 MCG CAPS capsule Take 1 capsule (290 mcg total) by  mouth daily.  . ondansetron (ZOFRAN) 4 MG tablet Take 1 tablet (4 mg total) by mouth every 8 (eight) hours as needed for nausea or vomiting.     Orders Placed This Encounter  Procedures  . CBC with Differential  . Lipid panel  . Ambulatory referral to Cardiology    No Follow-up on file.

## 2014-07-03 NOTE — Telephone Encounter (Signed)
Please advise refill? 

## 2014-07-05 ENCOUNTER — Encounter: Payer: Self-pay | Admitting: Internal Medicine

## 2014-07-05 NOTE — Assessment & Plan Note (Signed)
Using buspirone and hydroxyzine since Elavil has been dc'd

## 2014-07-05 NOTE — Assessment & Plan Note (Addendum)
9 lbs since March, unintentional. Pending cardiology evaluation will need to consider occult malignancy  Lab Results  Component Value Date   TSH 1.19 05/30/2014

## 2014-07-05 NOTE — Assessment & Plan Note (Addendum)
Consider SVT vs PAF given history of tobacco abuse. She becomes dizzy with epiosdes so Referral to Cardiology for Holter monitor was advised. Lab Results  Component Value Date   TSH 1.19 05/30/2014   Lab Results  Component Value Date   NA 142 05/30/2014   K 3.8 05/30/2014   CL 107 05/30/2014   CO2 27 05/30/2014   Lab Results  Component Value Date   WBC 8.4 08/11/2013   HGB 13.5 08/11/2013   HCT 40.0 08/11/2013   MCV 92.4 08/11/2013   PLT 236.0 08/11/2013

## 2014-07-05 NOTE — Assessment & Plan Note (Signed)
Improved with suspension of elavil . Lytes and thyroid function are normal.

## 2014-07-13 ENCOUNTER — Other Ambulatory Visit (INDEPENDENT_AMBULATORY_CARE_PROVIDER_SITE_OTHER): Payer: 59

## 2014-07-13 ENCOUNTER — Encounter: Payer: Self-pay | Admitting: Cardiovascular Disease

## 2014-07-13 ENCOUNTER — Ambulatory Visit (INDEPENDENT_AMBULATORY_CARE_PROVIDER_SITE_OTHER): Payer: 59 | Admitting: Cardiovascular Disease

## 2014-07-13 VITALS — BP 150/82 | HR 49 | Ht 61.0 in | Wt 104.5 lb

## 2014-07-13 DIAGNOSIS — Z720 Tobacco use: Secondary | ICD-10-CM

## 2014-07-13 DIAGNOSIS — R42 Dizziness and giddiness: Secondary | ICD-10-CM

## 2014-07-13 DIAGNOSIS — R002 Palpitations: Secondary | ICD-10-CM

## 2014-07-13 DIAGNOSIS — F17200 Nicotine dependence, unspecified, uncomplicated: Secondary | ICD-10-CM

## 2014-07-13 DIAGNOSIS — F5105 Insomnia due to other mental disorder: Secondary | ICD-10-CM

## 2014-07-13 DIAGNOSIS — R5383 Other fatigue: Secondary | ICD-10-CM

## 2014-07-13 DIAGNOSIS — R5381 Other malaise: Secondary | ICD-10-CM

## 2014-07-13 DIAGNOSIS — F419 Anxiety disorder, unspecified: Secondary | ICD-10-CM

## 2014-07-13 DIAGNOSIS — R634 Abnormal weight loss: Secondary | ICD-10-CM

## 2014-07-13 DIAGNOSIS — R079 Chest pain, unspecified: Secondary | ICD-10-CM

## 2014-07-13 DIAGNOSIS — E785 Hyperlipidemia, unspecified: Secondary | ICD-10-CM

## 2014-07-13 DIAGNOSIS — R21 Rash and other nonspecific skin eruption: Secondary | ICD-10-CM

## 2014-07-13 DIAGNOSIS — F411 Generalized anxiety disorder: Secondary | ICD-10-CM

## 2014-07-13 DIAGNOSIS — F489 Nonpsychotic mental disorder, unspecified: Secondary | ICD-10-CM

## 2014-07-13 DIAGNOSIS — R001 Bradycardia, unspecified: Secondary | ICD-10-CM

## 2014-07-13 DIAGNOSIS — I498 Other specified cardiac arrhythmias: Secondary | ICD-10-CM

## 2014-07-13 LAB — LIPID PANEL
CHOL/HDL RATIO: 3
Cholesterol: 196 mg/dL (ref 0–200)
HDL: 66.3 mg/dL (ref >39.00)
LDL CALC: 108 mg/dL — AB (ref 0–99)
NonHDL: 129.7
Triglycerides: 108 mg/dL (ref 0.0–149.0)
VLDL: 21.6 mg/dL (ref 0.0–40.0)

## 2014-07-13 LAB — CBC WITH DIFFERENTIAL/PLATELET
BASOS ABS: 0 10*3/uL (ref 0.0–0.1)
BASOS PCT: 0.3 % (ref 0.0–3.0)
Eosinophils Absolute: 0.1 10*3/uL (ref 0.0–0.7)
Eosinophils Relative: 1.3 % (ref 0.0–5.0)
HCT: 42 % (ref 36.0–46.0)
HEMOGLOBIN: 14 g/dL (ref 12.0–15.0)
LYMPHS PCT: 36.5 % (ref 12.0–46.0)
Lymphs Abs: 2.2 10*3/uL (ref 0.7–4.0)
MCHC: 33.2 g/dL (ref 30.0–36.0)
MCV: 95 fl (ref 78.0–100.0)
Monocytes Absolute: 0.5 10*3/uL (ref 0.1–1.0)
Monocytes Relative: 7.6 % (ref 3.0–12.0)
NEUTROS PCT: 54.3 % (ref 43.0–77.0)
Neutro Abs: 3.3 10*3/uL (ref 1.4–7.7)
Platelets: 232 10*3/uL (ref 150.0–400.0)
RBC: 4.42 Mil/uL (ref 3.87–5.11)
RDW: 14 % (ref 11.5–15.5)
WBC: 6 10*3/uL (ref 4.0–10.5)

## 2014-07-13 NOTE — Patient Instructions (Addendum)
You are doing well. No medication changes were made.  We will order a 48 hour holter  monitor for fast heart beat, palpitations We will call you with the results  Please pick up monitor today at 1:15 at [address], [place] ([place] in [place]) Go to the far right side of the building, down the stairs and ring the doorbell.  Please call Margaretha Sheffield at [phone] if you have any questions.   Please call us if you have new issues that need to be addressed before your next appt.

## 2014-07-13 NOTE — Assessment & Plan Note (Signed)
Possible depression as she does not feel like eating very much. Encouraged her to snack more. She only eats one meal per day

## 2014-07-13 NOTE — Assessment & Plan Note (Signed)
Currently taking Xanax on a regular basis. Anxiety may be contributing to her arrhythmia or palpitations

## 2014-07-13 NOTE — Assessment & Plan Note (Signed)
Low heart rate on today's visit. In general she is asymptomatic. Review of prior office visits with primary care showed heart rates typically in the 60 to 70s. No further workup other than a Holter which was ordered today

## 2014-07-13 NOTE — Assessment & Plan Note (Addendum)
I suspect her palpitations could be secondary to anxiety. Probable sinus tachycardia. Unable to exclude other arrhythmia and a 48-hour Holter monitor has been ordered. If this does show arrhythmia, it would be difficult to treat in the setting of her baseline sinus bradycardia. We will call her with the results of her Holter monitor

## 2014-07-13 NOTE — Assessment & Plan Note (Signed)
We have encouraged her to continue to work on weaning her cigarettes and smoking cessation. She will continue to work on this and does not want any assistance with chantix.  

## 2014-07-13 NOTE — Progress Notes (Signed)
Patient ID: Sherry Petersen, female    DOB: 1960-12-17, 53 y.o.   MRN: [id number]  HPI Comments: [name] is a pleasant 53 year old woman with long history of smoking for 30 years who continues to smoke one pack every 3 days, borderline hyperlipidemia, anxiety/depression, insomnia who presents by referral for tachycardia, palpitations. Rare episodes of chest pain.  She reports that she's been noticing a strong fast heart beat at times. Typically in the daytime, sometimes at nighttime. It seemed to start sometime in may 2015 prior to her getting a separation from her husband in June 2015. Symptoms seem to come and go. She describes it as fast, strong, regular beat. Symptoms seem to happen daily.  She also was having a rare episode of chest pain around her lower mediastinal area, upper epigastric area lasting for one or 2 seconds, not usually associated with exertion.  She does feel tired. She takes Xanax to help her sleep on a regular basis.  He continues to smoke, has not tried any other smoking aids. Total cholesterol 199, LDL 129. Weight has been trending down over the past year  She does report having a very strong family history of coronary artery disease in most of her relatives EKG shows sinus bradycardia with rate 49 beats per minute, no significant ST or T wave changes   Outpatient Encounter Prescriptions as of 07/13/2014  Medication Sig  . ALPRAZolam (XANAX) 1 MG tablet Take 1 tablet (1 mg total) by mouth at bedtime as needed for anxiety.  . cholecalciferol (VITAMIN D) 1000 UNITS tablet Take 5,000 Units by mouth daily.  Marland Kitchen FLUoxetine (PROZAC) 10 MG capsule Take 1 capsule (10 mg total) by mouth daily.  . hydrOXYzine (ATARAX/VISTARIL) 50 MG tablet Take 1 tablet (50 mg total) by mouth 3 (three) times daily as needed for itching.  . Lactulose 20 GM/30ML SOLN Take 30 mLs (20 g total) by mouth every three (3) days as needed. As needed for severe constipation  . Linaclotide (LINZESS) 290  MCG CAPS capsule Take 1 capsule (290 mcg total) by mouth daily.  . ondansetron (ZOFRAN) 4 MG tablet Take 1 tablet (4 mg total) by mouth every 8 (eight) hours as needed for nausea or vomiting.    Review of Systems  Constitutional: Negative.   HENT: Negative.   Eyes: Negative.   Respiratory: Negative.   Cardiovascular: Positive for chest pain and palpitations.       Tachycardia  Gastrointestinal: Negative.   Endocrine: Negative.   Musculoskeletal: Negative.   Skin: Negative.   Allergic/Immunologic: Negative.   Neurological: Negative.   Hematological: Negative.   Psychiatric/Behavioral: Positive for sleep disturbance. The patient is nervous/anxious.   All other systems reviewed and are negative.   BP 150/82  Pulse 49  Ht 5\' 1"  (1.549 m)  Wt 104 lb 8 oz (47.401 kg)  BMI 19.76 kg/m2  Physical Exam  Nursing note and vitals reviewed. Constitutional: She is oriented to person, place, and time. She appears well-developed and well-nourished.  HENT:  Head: Normocephalic.  Nose: Nose normal.  Mouth/Throat: Oropharynx is clear and moist.  Eyes: Conjunctivae are normal. Pupils are equal, round, and reactive to light.  Neck: Normal range of motion. Neck supple. No JVD present.  Cardiovascular: Normal rate, regular rhythm, S1 normal, S2 normal, normal heart sounds and intact distal pulses.  Exam reveals no gallop and no friction rub.   No murmur heard. Pulmonary/Chest: Effort normal and breath sounds normal. No respiratory distress. She has no wheezes. She  has no rales. She exhibits no tenderness.  Abdominal: Soft. Bowel sounds are normal. She exhibits no distension. There is no tenderness.  Musculoskeletal: Normal range of motion. She exhibits no edema and no tenderness.  Lymphadenopathy:    She has no cervical adenopathy.  Neurological: She is alert and oriented to person, place, and time. Coordination normal.  Skin: Skin is warm and dry. No rash noted. No erythema.  Psychiatric: She  has a normal mood and affect. Her behavior is normal. Judgment and thought content normal.    Assessment and Plan

## 2014-07-15 ENCOUNTER — Encounter: Payer: Self-pay | Admitting: Internal Medicine

## 2014-07-18 NOTE — Telephone Encounter (Signed)
Mailed unread message to pt  

## 2014-07-19 ENCOUNTER — Encounter: Payer: Self-pay | Admitting: Internal Medicine

## 2014-07-24 LAB — HM MAMMOGRAPHY: HM Mammogram: NORMAL

## 2014-08-13 ENCOUNTER — Telehealth: Payer: Self-pay

## 2014-08-13 DIAGNOSIS — I471 Supraventricular tachycardia: Secondary | ICD-10-CM

## 2014-08-13 DIAGNOSIS — I493 Ventricular premature depolarization: Secondary | ICD-10-CM

## 2014-08-13 NOTE — Telephone Encounter (Signed)
Spoke w/ pt.  Advised her of holter monitor results and Dr. Donivan Scull recommendation:  "-NSR w/ rare atrial tachycardia (8 beats) -PVCs, also second degree heart block, Type 1, -Ventricular escape rhythm  Would wear 30 day monitor, 3 different arrhythmias".   Pt verbalizes understanding and will call w/ any questions or concerns.

## 2014-08-17 ENCOUNTER — Ambulatory Visit (INDEPENDENT_AMBULATORY_CARE_PROVIDER_SITE_OTHER): Payer: 59 | Admitting: Internal Medicine

## 2014-08-17 ENCOUNTER — Encounter: Payer: Self-pay | Admitting: Internal Medicine

## 2014-08-17 ENCOUNTER — Telehealth: Payer: Self-pay

## 2014-08-17 ENCOUNTER — Other Ambulatory Visit: Payer: Self-pay

## 2014-08-17 ENCOUNTER — Encounter (INDEPENDENT_AMBULATORY_CARE_PROVIDER_SITE_OTHER): Payer: 59

## 2014-08-17 VITALS — BP 144/78 | HR 64 | Temp 98.6°F | Resp 14 | Ht 62.0 in | Wt 99.2 lb

## 2014-08-17 DIAGNOSIS — K5900 Constipation, unspecified: Secondary | ICD-10-CM

## 2014-08-17 DIAGNOSIS — Z716 Tobacco abuse counseling: Secondary | ICD-10-CM

## 2014-08-17 DIAGNOSIS — R42 Dizziness and giddiness: Secondary | ICD-10-CM

## 2014-08-17 DIAGNOSIS — K5909 Other constipation: Secondary | ICD-10-CM

## 2014-08-17 DIAGNOSIS — R079 Chest pain, unspecified: Secondary | ICD-10-CM

## 2014-08-17 DIAGNOSIS — R5383 Other fatigue: Secondary | ICD-10-CM

## 2014-08-17 DIAGNOSIS — Z0000 Encounter for general adult medical examination without abnormal findings: Secondary | ICD-10-CM

## 2014-08-17 DIAGNOSIS — Z1239 Encounter for other screening for malignant neoplasm of breast: Secondary | ICD-10-CM

## 2014-08-17 DIAGNOSIS — C44519 Basal cell carcinoma of skin of other part of trunk: Secondary | ICD-10-CM

## 2014-08-17 DIAGNOSIS — R002 Palpitations: Secondary | ICD-10-CM

## 2014-08-17 DIAGNOSIS — F17200 Nicotine dependence, unspecified, uncomplicated: Secondary | ICD-10-CM

## 2014-08-17 DIAGNOSIS — G5602 Carpal tunnel syndrome, left upper limb: Secondary | ICD-10-CM

## 2014-08-17 DIAGNOSIS — G5600 Carpal tunnel syndrome, unspecified upper limb: Secondary | ICD-10-CM

## 2014-08-17 DIAGNOSIS — Z7189 Other specified counseling: Secondary | ICD-10-CM

## 2014-08-17 NOTE — Telephone Encounter (Addendum)
Called pt, pt is aware of her 3D mammogram at the [facility] on Sept 16th at 1:30pm

## 2014-08-17 NOTE — Progress Notes (Signed)
Pre-visit discussion using our clinic review tool. No additional management support is needed unless otherwise documented below in the visit note.  

## 2014-08-17 NOTE — Patient Instructions (Signed)
You had your annual  wellness exam today.  We will repeat your PAP smear in 2016, sooner if needed   We will schedule your mammogram  At [facility]   Nicotine Addiction Nicotine can act as both a stimulant (excites/activates) and a sedative (calms/quiets). Immediately after exposure to nicotine, there is a "kick" caused in part by the drug's stimulation of the adrenal glands and resulting discharge of adrenaline (epinephrine). The rush of adrenaline stimulates the body and causes a sudden release of sugar. This means that smokers are always slightly hyperglycemic. Hyperglycemic means that the blood sugar is high, just like in diabetics. Nicotine also decreases the amount of insulin which helps control sugar levels in the body. There is an increase in blood pressure, breathing, and the rate of heart beats.  In addition, nicotine indirectly causes a release of dopamine in the brain that controls pleasure and motivation. A similar reaction is seen with other drugs of abuse, such as cocaine and heroin. This dopamine release is thought to cause the pleasurable sensations when smoking. In some different cases, nicotine can also create a calming effect, depending on sensitivity of the smoker's nervous system and the dose of nicotine taken. WHAT HAPPENS WHEN NICOTINE IS TAKEN FOR LONG PERIODS OF TIME?  Long-term use of nicotine results in addiction. It is difficult to stop.  Repeated use of nicotine creates tolerance. Higher doses of nicotine are needed to get the "kick." When nicotine use is stopped, withdrawal may last a month or more. Withdrawal may begin within a few hours after the last cigarette. Symptoms peak within the first few days and may lessen within a few weeks. For some people, however, symptoms may last for months or longer. Withdrawal symptoms include:   Irritability.  Craving.  Learning and attention deficits.  Sleep disturbances.  Increased appetite. Craving for tobacco may last  for 6 months or longer. Many behaviors done while using nicotine can also play a part in the severity of withdrawal symptoms. For some people, the feel, smell, and sight of a cigarette and the ritual of obtaining, handling, lighting, and smoking the cigarette are closely linked with the pleasure of smoking. When stopped, they also miss the related behaviors which make the withdrawal or craving worse. While nicotine gum and patches may lessen the drug aspects of withdrawal, cravings often persist. WHAT ARE THE MEDICAL CONSEQUENCES OF NICOTINE USE?  Nicotine addiction accounts for one-third of all cancers. The top cancer caused by tobacco is lung cancer. Lung cancer is the number one cancer killer of both men and women.  Smoking is also associated with cancers of the:  Mouth.  Pharynx.  Larynx.  Esophagus.  Stomach.  Pancreas.  Cervix.  Kidney.  Ureter.  Bladder.  Smoking also causes lung diseases such as lasting (chronic) bronchitis and emphysema.  It worsens asthma in adults and children.  Smoking increases the risk of heart disease, including:  Stroke.  Heart attack.  Vascular disease.  Aneurysm.  Passive or secondary smoke can also increase medical risks including:  Asthma in children.  Sudden Infant Death Syndrome (SIDS).  Additionally, dropped cigarettes are the leading cause of residential fire fatalities.  Nicotine poisoning has been reported from accidental ingestion of tobacco products by children and pets. Death usually results in a few minutes from respiratory failure (when a person stops breathing) caused by paralysis. TREATMENT   Medication. Nicotine replacement medicines such as nicotine gum and the patch are used to stop smoking. These medicines gradually lower  the dosage of nicotine in the body. These medicines do not contain the carbon monoxide and other toxins found in tobacco smoke.  Hypnotherapy.  Relaxation therapy.  Nicotine Anonymous (a  12-step support program). Find times and locations in your local yellow pages. Document Released: 08/05/2004 Document Revised: 02/22/2012 Document Reviewed: 01/26/2014 [facility] Patient Information 2015 [place], [place]. This information is not intended to replace advice given to you by your health care provider. Make sure you discuss any questions you have with your health care provider.

## 2014-08-18 DIAGNOSIS — R000 Tachycardia, unspecified: Secondary | ICD-10-CM

## 2014-08-19 ENCOUNTER — Encounter: Payer: Self-pay | Admitting: Internal Medicine

## 2014-08-19 DIAGNOSIS — Z716 Tobacco abuse counseling: Secondary | ICD-10-CM | POA: Insufficient documentation

## 2014-08-19 NOTE — Assessment & Plan Note (Signed)
Annual  wellness  exam was done as well as a comprehensive physical exam and management of acute and chronic conditions .  During the course of the visit the patient was educated and counseled about appropriate screening and preventive services including : fall prevention , diabetes screening, nutrition counseling, colorectal cancer screening, and recommended immunizations.  Printed recommendations for health maintenance screenings was given.  

## 2014-08-19 NOTE — Assessment & Plan Note (Signed)
Smoking cessation instruction/counseling given:  counseled patient on the dangers of tobacco use, advised patient to stop smoking, and reviewed strategies to maximize success 

## 2014-08-19 NOTE — Progress Notes (Signed)
Patient ID: BILLIE TRAGER, female   DOB: Feb 10, 1961, 53 y.o.   MRN: [id number]  Subjective:     Sherry Petersen is a 53 y.o. female and is here for a comprehensive physical exam. The patient reports problems - with persistent constipation .  History   Social History  . Marital Status: Married    Spouse Name: N/A    Number of Children: N/A  . Years of Education: N/A   Occupational History  . Not on file.   Social History Main Topics  . Smoking status: Current Some Day Smoker -- 0.25 packs/day for 30 years    Types: Cigarettes  . Smokeless tobacco: Never Used     Comment: smokes 4-5 cigs per day  . Alcohol Use: No     Comment: never  . Drug Use: No  . Sexual Activity: Not on file   Other Topics Concern  . Not on file   Social History Narrative  . No narrative on file   Health Maintenance  Topic Date Due  . Influenza Vaccine  07/14/2014  . Mammogram  08/09/2014  . Pap Smear  08/15/2016  . Colonoscopy  08/09/2021  . Tetanus/tdap  08/16/2023    The following portions of the patient's history were reviewed and updated as appropriate: allergies, current medications, past family history, past medical history, past social history, past surgical history and problem list.  Review of Systems A comprehensive review of systems was negative except for: Gastrointestinal: positive for constipation   Objective:   BP 144/78  Pulse 64  Temp(Src) 98.6 F (37 C) (Oral)  Resp 14  Ht 5\' 2"  (1.575 m)  Wt 99 lb 4 oz (45.02 kg)  BMI 18.15 kg/m2  SpO2 94%  General appearance: alert, cooperative and appears stated age Head: Normocephalic, without obvious abnormality, atraumatic Eyes: conjunctivae/corneas clear. PERRL, EOM's intact. Fundi benign. Ears: normal TM's and external ear canals both ears Nose: Nares normal. Septum midline. Mucosa normal. No drainage or sinus tenderness. Throat: lips, mucosa, and tongue normal; teeth and gums normal Neck: no adenopathy, no carotid bruit, no  JVD, supple, symmetrical, trachea midline and thyroid not enlarged, symmetric, no tenderness/mass/nodules Lungs: clear to auscultation bilaterally Breasts: normal appearance, no masses or tenderness Heart: regular rate and rhythm, S1, S2 normal, no murmur, click, rub or gallop Abdomen: soft, non-tender; bowel sounds normal; no masses,  no organomegaly Extremities: extremities normal, atraumatic, no cyanosis or edema Pulses: 2+ and symmetric Skin: Skin color, texture, turgor normal. No rashes or lesions Neurologic: Alert and oriented X 3, normal strength and tone. Normal symmetric reflexes. Normal coordination and gait.    Assessment and Plan:    Constipation, chronic She continues to average one bowel movement weekly, without bleeding but often has vomiting , despite suspension of elavil in July. She had a normal colonoscopy in 2012.  Trial of Linzess.   Routine general medical examination at a health care facility Annual wellness  exam was done as well as a comprehensive physical exam and management of acute and chronic conditions .  During the course of the visit the patient was educated and counseled about appropriate screening and preventive services including : fall prevention , diabetes screening, nutrition counseling, colorectal cancer screening, and recommended immunizations.  Printed recommendations for health maintenance screenings was given.   Tobacco abuse counseling Smoking cessation instruction/counseling given:  counseled patient on the dangers of tobacco use, advised patient to stop smoking, and reviewed strategies to maximize success   Updated Medication List  Outpatient Encounter Prescriptions as of 08/17/2014  Medication Sig  . ALPRAZolam (XANAX) 1 MG tablet Take 1 tablet (1 mg total) by mouth at bedtime as needed for anxiety.  . cholecalciferol (VITAMIN D) 1000 UNITS tablet Take 5,000 Units by mouth daily.  Marland Kitchen FLUoxetine (PROZAC) 10 MG capsule Take 1 capsule (10 mg total)  by mouth daily.  . hydrOXYzine (ATARAX/VISTARIL) 50 MG tablet Take 1 tablet (50 mg total) by mouth 3 (three) times daily as needed for itching.  . Linaclotide (LINZESS) 290 MCG CAPS capsule Take 1 capsule (290 mcg total) by mouth daily.  . ondansetron (ZOFRAN) 4 MG tablet Take 1 tablet (4 mg total) by mouth every 8 (eight) hours as needed for nausea or vomiting.  . Lactulose 20 GM/30ML SOLN Take 30 mLs (20 g total) by mouth every three (3) days as needed. As needed for severe constipation

## 2014-08-19 NOTE — Assessment & Plan Note (Addendum)
She continues to average one bowel movement weekly, without bleeding but often has vomiting , despite suspension of elavil in July. She had a normal colonoscopy in 2012.  Trial of Linzess.

## 2014-08-29 ENCOUNTER — Ambulatory Visit: Payer: 59

## 2014-09-21 ENCOUNTER — Other Ambulatory Visit: Payer: Self-pay | Admitting: Internal Medicine

## 2014-09-21 NOTE — Telephone Encounter (Signed)
Last refill 9.21.15.  Please advise refill

## 2014-09-23 NOTE — Telephone Encounter (Signed)
Ok to refill,  printed rx  

## 2014-09-24 NOTE — Telephone Encounter (Signed)
rx faxed

## 2014-10-03 ENCOUNTER — Other Ambulatory Visit: Payer: Self-pay | Admitting: Internal Medicine

## 2014-10-03 DIAGNOSIS — Z1231 Encounter for screening mammogram for malignant neoplasm of breast: Secondary | ICD-10-CM

## 2014-10-05 ENCOUNTER — Ambulatory Visit
Admission: RE | Admit: 2014-10-05 | Discharge: 2014-10-05 | Disposition: A | Discharge status: Home / Self Care | Payer: 59 | Source: Ambulatory Visit | Attending: Internal Medicine | Admitting: Internal Medicine

## 2014-10-05 DIAGNOSIS — Z1231 Encounter for screening mammogram for malignant neoplasm of breast: Secondary | ICD-10-CM

## 2014-10-30 ENCOUNTER — Telehealth: Payer: Self-pay

## 2014-10-30 NOTE — Telephone Encounter (Signed)
Attempted to contact pt regarding 30 day monitor results: "NSR w/ no significant arrythmia". Left message for pt to call back.

## 2014-10-31 NOTE — Telephone Encounter (Signed)
Reviewed results w/ pt.   She verbalizes understanding and will call back w/ any questions or concerns.  

## 2014-11-02 ENCOUNTER — Other Ambulatory Visit: Payer: Self-pay

## 2014-11-02 ENCOUNTER — Encounter (INDEPENDENT_AMBULATORY_CARE_PROVIDER_SITE_OTHER): Payer: 59

## 2014-11-02 DIAGNOSIS — I471 Supraventricular tachycardia: Secondary | ICD-10-CM

## 2014-11-02 DIAGNOSIS — I493 Ventricular premature depolarization: Secondary | ICD-10-CM

## 2015-01-09 ENCOUNTER — Other Ambulatory Visit: Payer: Self-pay | Admitting: *Deleted

## 2015-01-09 NOTE — Telephone Encounter (Signed)
Pt requests refill of Xanax, last visit 9/15. Ok refill?

## 2015-01-10 MED ORDER — ALPRAZOLAM 1 MG PO TABS: ORAL_TABLET | ORAL | Status: DC

## 2015-01-10 NOTE — Telephone Encounter (Signed)
Yes, can refill til end of MARCH.  WILL NEED 6 MONTH FOLLO W UP,. RX PRINTED

## 2015-01-11 NOTE — Telephone Encounter (Signed)
Called to pharmacy 

## 2015-01-24 ENCOUNTER — Other Ambulatory Visit: Payer: Self-pay | Admitting: Internal Medicine

## 2015-01-24 ENCOUNTER — Encounter: Payer: Self-pay | Admitting: Internal Medicine

## 2015-01-24 ENCOUNTER — Ambulatory Visit (INDEPENDENT_AMBULATORY_CARE_PROVIDER_SITE_OTHER): Payer: 59 | Admitting: Internal Medicine

## 2015-01-24 ENCOUNTER — Ambulatory Visit (INDEPENDENT_AMBULATORY_CARE_PROVIDER_SITE_OTHER)
Admission: RE | Admit: 2015-01-24 | Discharge: 2015-01-24 | Disposition: A | Discharge status: Home / Self Care | Payer: 59 | Source: Ambulatory Visit | Attending: Internal Medicine | Admitting: Internal Medicine

## 2015-01-24 VITALS — BP 124/78 | HR 59 | Temp 98.3°F | Resp 16 | Ht 62.0 in | Wt 99.8 lb

## 2015-01-24 DIAGNOSIS — Z720 Tobacco use: Secondary | ICD-10-CM

## 2015-01-24 DIAGNOSIS — F419 Anxiety disorder, unspecified: Secondary | ICD-10-CM

## 2015-01-24 DIAGNOSIS — E559 Vitamin D deficiency, unspecified: Secondary | ICD-10-CM

## 2015-01-24 DIAGNOSIS — F418 Other specified anxiety disorders: Secondary | ICD-10-CM

## 2015-01-24 DIAGNOSIS — R002 Palpitations: Secondary | ICD-10-CM

## 2015-01-24 DIAGNOSIS — F5105 Insomnia due to other mental disorder: Secondary | ICD-10-CM

## 2015-01-24 DIAGNOSIS — R634 Abnormal weight loss: Secondary | ICD-10-CM

## 2015-01-24 DIAGNOSIS — E538 Deficiency of other specified B group vitamins: Secondary | ICD-10-CM

## 2015-01-24 LAB — COMPREHENSIVE METABOLIC PANEL
ALBUMIN: 4.4 g/dL (ref 3.5–5.2)
ALT: 8 U/L (ref 0–35)
AST: 13 U/L (ref 0–37)
Alkaline Phosphatase: 91 U/L (ref 39–117)
BUN: 15 mg/dL (ref 6–23)
CALCIUM: 9.5 mg/dL (ref 8.4–10.5)
CO2: 26 mEq/L (ref 19–32)
Chloride: 108 mEq/L (ref 96–112)
Creatinine, Ser: 0.65 mg/dL (ref 0.40–1.20)
GFR: 100.19 mL/min (ref >60.00)
Glucose, Bld: 85 mg/dL (ref 70–99)
POTASSIUM: 4.3 meq/L (ref 3.5–5.1)
SODIUM: 143 meq/L (ref 135–145)
Total Bilirubin: 0.3 mg/dL (ref 0.2–1.2)
Total Protein: 6.4 g/dL (ref 6.0–8.3)

## 2015-01-24 LAB — VITAMIN B12: Vitamin B-12: 284 pg/mL (ref 211–911)

## 2015-01-24 LAB — VITAMIN D 25 HYDROXY (VIT D DEFICIENCY, FRACTURES): VITD: 53.07 ng/mL (ref 30.00–100.00)

## 2015-01-24 MED ORDER — LINACLOTIDE 145 MCG PO CAPS
145.0000 ug | ORAL_CAPSULE | Freq: Every day | ORAL | Status: DC
Start: 2015-01-24 — End: 2017-07-28

## 2015-01-24 MED ORDER — BUPROPION HCL ER (XL) 150 MG PO TB24: 150.0000 mg | ORAL_TABLET | Freq: Every day | ORAL | Status: DC

## 2015-01-24 NOTE — Progress Notes (Signed)
Pre-visit discussion using our clinic review tool. No additional management support is needed unless otherwise documented below in the visit note.  

## 2015-01-24 NOTE — Progress Notes (Signed)
Patient ID: Sherry Petersen, female   DOB: 1961-06-03, 54 y.o.   MRN: [id number]  Patient Active Problem List   Diagnosis Date Noted  . Depression with anxiety 01/26/2015  . Tobacco abuse counseling 08/19/2014  . Bradycardia 07/13/2014  . Palpitations 06/02/2014  . B12 deficiency 05/31/2014  . Loss of weight 05/30/2014  . Routine general medical examination at a health care facility 08/17/2013  . 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 Basal cell carcinoma of chest wall 02/12/2013  . Generalized anxiety disorder   . Insomnia secondary to anxiety 06/30/2012  . Carpal tunnel syndrome of left wrist 06/29/2012  . Constipation, chronic 06/29/2012  . Tobacco abuse 06/29/2012  . Arthritis 02/23/2012    Subjective:  CC:   Chief Complaint  Patient presents with  . Follow-up    Discuss weight and medication    HPI:   Sherry Petersen is a 54 y.o. female who presents for Follow up on multiple medical issues  Last seen July 2015:  unintentional  weight loss. She has lost  22 lb lss since 2013.  There has been No change since last visit,  But she has Stopped the  prozac that was started for  depression/GAD because she states that she generally  feels better without it. Still using alprazolam daily for anxiety/insomnia. Still only eating  2 meals dialy.. Skips breakfast .  Familial stressors contributing to depressive symptoms including estrangement from husband.    Tobacco abuse:    She has been gradually reducing her daily cigarette use and is down to  5 /day.  She would like to quit completely, and will consider medication.  Has not had a chest x ray in years. ,     palpitations:  She continues to have intermittent episodes of palpitations occurring mostly in the evenings, which cause her to feel dizzy and presyncopal . She denies chest pain.   electrolytes and tsh were normal last month.  Had na EGD in 2012 which noted gastric erosions.  But denies abdominal  pain currentlyu. Cardiology evaluation included a 48 hour  Holter Monitor which was normal.    Constipation: Her constipation has improved since she stopped taking elavil at bedtime.    Past Medical History  Diagnosis Date  . Hematuria     CT Scan normal  . Anxiety     Past Surgical History  Procedure Laterality Date  . Carpal tunnel release  2003  . Vaginal hysterectomy  2002  . Tonsillectomy  2012       The following portions of the patient's history were reviewed and updated as appropriate: Allergies, current medications, and problem list.    Review of Systems:   Patient denies headache, fevers, malaise, unintentional weight loss, skin rash, eye pain, sinus congestion and sinus pain, sore throat, dysphagia,  hemoptysis , cough, dyspnea, wheezing, chest pain, palpitations, orthopnea, edema, abdominal pain, nausea, melena, diarrhea, constipation, flank pain, dysuria, hematuria, urinary  Frequency, nocturia, numbness, tingling, seizures,  Focal weakness, Loss of consciousness,  Tremor, insomnia, depression, anxiety, and suicidal ideation.     History   Social History  . Marital Status: Married    Spouse Name: N/A  . Number of Children: N/A  . Years of Education: N/A   Occupational History  . Not on file.   Social History Main Topics  . Smoking status: Current Some Day Smoker -- 0.25 packs/day for 30 years    Types: Cigarettes  . Smokeless  tobacco: Never Used     Comment: smokes 4-5 cigs per day  . Alcohol Use: No     Comment: never  . Drug Use: No  . Sexual Activity: Not on file   Other Topics Concern  . Not on file   Social History Narrative    Objective:  Filed Vitals:   01/24/15 1102  BP: 124/78  Pulse: 59  Temp: 98.3 F (36.8 C)  Resp: 16     General appearance: alert, cooperative and appears stated age Ears: normal TM's and external ear canals both ears Throat: lips, mucosa, and tongue normal; teeth and gums normal Neck: no adenopathy,  no carotid bruit, supple, symmetrical, trachea midline and thyroid not enlarged, symmetric, no tenderness/mass/nodules Back: symmetric, no curvature. ROM normal. No CVA tenderness. Lungs: clear to auscultation bilaterally Heart: regular rate and rhythm, S1, S2 normal, no murmur, click, rub or gallop Abdomen: soft, non-tender; bowel sounds normal; no masses,  no organomegaly Pulses: 2+ and symmetric Skin: Skin color, texture, turgor normal. No rashes or lesions Lymph nodes: Cervical, supraclavicular, and axillary nodes normal.  Assessment and Plan:  Loss of weight Her weight has stabilized,  But her Body mass index is 18.24 kg/(m^2). and borders on underweight. The weight loss was likely secondary to depression/anxieyt and poor eating habit, which were addressed today.  PA and Lateral chest x ray ordered given ongoing tobacco abuse to rule out neoplasm noted emphysematous changes only. recommending noncontrasted CT of chest    Insomnia secondary to anxiety She did not tolerate elavil due to constipation.  Continue prn alprazolam.    Palpitations 48 hour Holter monitor  recorded no events despite patient reporting having recurrent episodes.     Tobacco abuse Given her concurrent depression, discussed pharmacotherapy trial with wellbutrin.    Depression with anxiety Chief symptoms are anhedonia, irritability, and anorexia.  No suicidality.  estrangement from husband a contributing factor.  Discussed alternative pharmacotherapy,  Decided on wellbutrin for concurrent tobacco cessation   A total of 40 minutes was spent with patient more than half of which was spent in counseling patient on the above mentioned issues , reviewing and explaining recent labs and imaging studies done, and coordination of care.  Updated Medication List Outpatient Encounter Prescriptions as of 01/24/2015  Medication Sig  . ALPRAZolam (XANAX) 1 MG tablet TAKE 1 TABLET BY MOUTH AT BEDTIME AS NEEDED FOR ANXIETY   . cholecalciferol (VITAMIN D) 1000 UNITS tablet Take 5,000 Units by mouth daily.  . Linaclotide (LINZESS) 145 MCG CAPS capsule Take 1 capsule (145 mcg total) by mouth daily.  . ondansetron (ZOFRAN) 4 MG tablet Take 1 tablet (4 mg total) by mouth every 8 (eight) hours as needed for nausea or vomiting.  . [DISCONTINUED] Linaclotide (LINZESS) 290 MCG CAPS capsule Take 1 capsule (290 mcg total) by mouth daily.  . [DISCONTINUED] buPROPion (WELLBUTRIN XL) 150 MG 24 hr tablet Take 1 tablet (150 mg total) by mouth daily. In the morning  . [DISCONTINUED] FLUoxetine (PROZAC) 10 MG capsule Take 1 capsule (10 mg total) by mouth daily. (Patient not taking: Reported on 01/24/2015)  . [DISCONTINUED] hydrOXYzine (ATARAX/VISTARIL) 50 MG tablet Take 1 tablet (50 mg total) by mouth 3 (three) times daily as needed for itching. (Patient not taking: Reported on 01/24/2015)  . [DISCONTINUED] Lactulose 20 GM/30ML SOLN Take 30 mLs (20 g total) by mouth every three (3) days as needed. As needed for severe constipation (Patient not taking: Reported on 01/24/2015)     Orders Placed  This Encounter  Procedures  . HM MAMMOGRAPHY  . DG Chest 2 View  . Vit D  25 hydroxy (rtn osteoporosis monitoring)  . Comprehensive metabolic panel  . Vitamin B12    Return in about 3 months (around 04/24/2015).

## 2015-01-24 NOTE — Patient Instructions (Signed)
Trial of wellbutrin for depression and smoking cessation  Chest  X ray ASAP [facility] or [place] office)

## 2015-01-25 ENCOUNTER — Telehealth: Payer: Self-pay

## 2015-01-25 NOTE — Telephone Encounter (Signed)
PA completed on cover my meds for Linzess. Awaiting a response at this time.

## 2015-01-25 NOTE — Telephone Encounter (Signed)
Linzess has been approved for coverage. This medication is approved for coverage until 01/26/16.

## 2015-01-26 ENCOUNTER — Encounter: Payer: Self-pay | Admitting: Internal Medicine

## 2015-01-26 DIAGNOSIS — F418 Other specified anxiety disorders: Secondary | ICD-10-CM | POA: Insufficient documentation

## 2015-01-26 NOTE — Assessment & Plan Note (Signed)
Given her concurrent depression, discussed pharmacotherapy trial with wellbutrin.

## 2015-01-26 NOTE — Assessment & Plan Note (Addendum)
Her weight has stabilized,  But her Body mass index is 18.24 kg/(m^2). and borders on underweight. The weight loss was likely secondary to depression/anxieyt and poor eating habit, which were addressed today.  PA and Lateral chest x ray ordered given ongoing tobacco abuse to rule out neoplasm

## 2015-01-26 NOTE — Assessment & Plan Note (Signed)
Chief symptoms are anhedonia, irritability, and anorexia.  No suicidality.  estrangement from husband a contributing factor.  Discussed alternative pharmacotherapy,  Decided on wellbutrin for concurrent tobacco cessation

## 2015-01-26 NOTE — Assessment & Plan Note (Signed)
48 hour Holter monitor  recorded no events despite patient reporting having recurrent episodes.

## 2015-01-26 NOTE — Assessment & Plan Note (Signed)
She did not tolerate elavil due to constipation.  Continue prn alprazolam.

## 2015-01-27 ENCOUNTER — Encounter: Payer: Self-pay | Admitting: Internal Medicine

## 2015-03-08 ENCOUNTER — Other Ambulatory Visit: Payer: Self-pay | Admitting: Internal Medicine

## 2015-03-11 NOTE — Telephone Encounter (Signed)
Okay to refill? Last seen on 01/24/15

## 2015-03-12 ENCOUNTER — Telehealth: Payer: Self-pay

## 2015-03-12 MED ORDER — ALPRAZOLAM 1 MG PO TABS: ORAL_TABLET | ORAL | Status: DC

## 2015-03-12 NOTE — Telephone Encounter (Signed)
Ok to refill,  printed rx  

## 2015-03-12 NOTE — Telephone Encounter (Signed)
Paper request for alprazolam second request received. Medication last filled 01/10/15 with 1 additional refill. Patient last seen by PCP 01/24/15. Please advise.

## 2015-03-12 NOTE — Telephone Encounter (Signed)
Rx placed in blue folder for signature.

## 2015-03-12 NOTE — Telephone Encounter (Signed)
Rx faxed to pharmacy  

## 2015-04-08 ENCOUNTER — Telehealth: Payer: Self-pay | Admitting: *Deleted

## 2015-04-08 NOTE — Telephone Encounter (Signed)
No note

## 2015-04-26 ENCOUNTER — Ambulatory Visit (INDEPENDENT_AMBULATORY_CARE_PROVIDER_SITE_OTHER): Payer: 59 | Admitting: Internal Medicine

## 2015-04-26 ENCOUNTER — Encounter: Payer: Self-pay | Admitting: Internal Medicine

## 2015-04-26 VITALS — BP 134/72 | HR 54 | Temp 98.6°F | Resp 14 | Ht 62.0 in | Wt 100.8 lb

## 2015-04-26 DIAGNOSIS — Z720 Tobacco use: Secondary | ICD-10-CM | POA: Diagnosis not present

## 2015-04-26 DIAGNOSIS — R0981 Nasal congestion: Secondary | ICD-10-CM | POA: Diagnosis not present

## 2015-04-26 MED ORDER — PREDNISONE 10 MG PO TABS: ORAL_TABLET | ORAL | Status: DC

## 2015-04-26 NOTE — Patient Instructions (Addendum)
You don't have evidence of a bacterial sinus infection currently, but if you don't treat the congestion,  You may develop one.   You can take generic OTC benadryl 25 mg every 8 hours for the drainage,  Sudafed PE  10 to 20 mg every 6 hours for the congestion,  and flush your  sinuses twice daily with a Nettie Pott or neil Med's simsu rinse  Prednisone taper to get the inflammation

## 2015-04-26 NOTE — Progress Notes (Signed)
Patient ID: Sherry Petersen, female   DOB: Nov 09, 1961, 54 y.o.   MRN: [id number]  ,   Patient Active Problem List   Diagnosis Date Noted  . Nasal sinus congestion 04/28/2015  . Depression with anxiety 01/26/2015  . Tobacco abuse counseling 08/19/2014  . Bradycardia 07/13/2014  . Palpitations 06/02/2014  . B12 deficiency 05/31/2014  . Loss of weight 05/30/2014  . Routine general medical examination at a health care facility 08/17/2013  . 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 Basal cell carcinoma of chest wall 02/12/2013  . Generalized anxiety disorder   . Insomnia secondary to anxiety 06/30/2012  . Carpal tunnel syndrome of left wrist 06/29/2012  . Constipation, chronic 06/29/2012  . Tobacco abuse 06/29/2012  . Arthritis 02/23/2012    Subjective:  CC:   Chief Complaint  Patient presents with  . Follow-up    3 month follow up depression. Wellbutrin caused nausea    HPI:   Sherry Petersen is a 54 y.o. female who presents for 3 month follow up on depression.  She did not tolerate wellbutrin due to recurrent nausea for 5 days straight.   She has reduced smoking, and is going to use the patch.  She is using 2 cigarettes per night .  She has been having sinus congestion and sore throat  since last week.  She used flonase with no improvement.  She has been using benadryl seldomly.  Symptoms are aggravated by the new car smell.  Her coworker is frequently sick.  She works in Nurse, mental health.   Passed a stomach virus around last, patient had it for a few days with diarrhea and nausea, which is finally starting to resolve.  Using zofran    Past Medical History  Diagnosis Date  . Hematuria     CT Scan normal  . Anxiety     Past Surgical History  Procedure Laterality Date  . Carpal tunnel release  2003  . Vaginal hysterectomy  2002  . Tonsillectomy  2012       The following portions of the patient's history were reviewed and updated as appropriate:  Allergies, current medications, and problem list.    Review of Systems:   Patient denies headache, fevers, malaise, unintentional weight loss, skin rash, eye pain, sinus congestion and sinus pain, sore throat, dysphagia,  hemoptysis , cough, dyspnea, wheezing, chest pain, palpitations, orthopnea, edema, abdominal pain, nausea, melena, diarrhea, constipation, flank pain, dysuria, hematuria, urinary  Frequency, nocturia, numbness, tingling, seizures,  Focal weakness, Loss of consciousness,  Tremor, insomnia, depression, anxiety, and suicidal ideation.     History   Social History  . Marital Status: Married    Spouse Name: N/A  . Number of Children: N/A  . Years of Education: N/A   Occupational History  . Not on file.   Social History Main Topics  . Smoking status: Current Some Day Smoker -- 0.25 packs/day for 30 years    Types: Cigarettes  . Smokeless tobacco: Never Used     Comment: smokes 4-5 cigs per day  . Alcohol Use: No     Comment: never  . Drug Use: No  . Sexual Activity: Not on file   Other Topics Concern  . Not on file   Social History Narrative    Objective:  Filed Vitals:   04/26/15 1050  BP: 134/72  Pulse: 54  Temp: 98.6 F (37 C)  Resp: 14     General appearance: alert, cooperative  and appears stated age Ears: normal TM's and external ear canals both ears Throat: lips, mucosa, and tongue normal; teeth and gums normal Neck: no adenopathy, no carotid bruit, supple, symmetrical, trachea midline and thyroid not enlarged, symmetric, no tenderness/mass/nodules Back: symmetric, no curvature. ROM normal. No CVA tenderness. Lungs: clear to auscultation bilaterally Heart: regular rate and rhythm, S1, S2 normal, no murmur, click, rub or gallop Abdomen: soft, non-tender; bowel sounds normal; no masses,  no organomegaly Pulses: 2+ and symmetric Skin: Skin color, texture, turgor normal. No rashes or lesions Lymph nodes: Cervical, supraclavicular, and axillary  nodes normal.  Assessment and Plan:  Tobacco abuse She has reduced her consumption to less than 4 daily and plans to use nicotine patches .  She did not tolerate wellbutrin.   Nasal sinus congestion Will treat with prednisone taper, nasal decongestants      Updated Medication List Outpatient Encounter Prescriptions as of 04/26/2015  Medication Sig  . ALPRAZolam (XANAX) 1 MG tablet TAKE 1 TABLET BY MOUTH AT BEDTIME AS NEEDED FOR ANXIETY  . cholecalciferol (VITAMIN D) 1000 UNITS tablet Take 5,000 Units by mouth daily.  . Linaclotide (LINZESS) 145 MCG CAPS capsule Take 1 capsule (145 mcg total) by mouth daily.  . ondansetron (ZOFRAN) 4 MG tablet Take 1 tablet (4 mg total) by mouth every 8 (eight) hours as needed for nausea or vomiting.  . predniSONE (DELTASONE) 10 MG tablet 6 tablets on Day 1 , then reduce by 1 tablet daily until gone  . [DISCONTINUED] buPROPion (WELLBUTRIN XL) 150 MG 24 hr tablet TAKE 1 TABLET BY MOUTH EVERY MORNING   No facility-administered encounter medications on file as of 04/26/2015.     No orders of the defined types were placed in this encounter.    Return in about 4 months (around 08/27/2015).

## 2015-04-26 NOTE — Progress Notes (Signed)
Pre visit review using our clinic review tool, if applicable. No additional management support is needed unless otherwise documented below in the visit note. 

## 2015-04-28 ENCOUNTER — Encounter: Payer: Self-pay | Admitting: Internal Medicine

## 2015-04-28 DIAGNOSIS — R0981 Nasal congestion: Secondary | ICD-10-CM | POA: Insufficient documentation

## 2015-04-28 NOTE — Assessment & Plan Note (Signed)
She has reduced her consumption to less than 4 daily and plans to use nicotine patches .  She did not tolerate wellbutrin.

## 2015-04-28 NOTE — Assessment & Plan Note (Signed)
Will treat with prednisone taper, nasal decongestants

## 2015-07-02 ENCOUNTER — Ambulatory Visit (INDEPENDENT_AMBULATORY_CARE_PROVIDER_SITE_OTHER): Payer: 59 | Admitting: Nurse Practitioner

## 2015-07-02 ENCOUNTER — Encounter: Payer: Self-pay | Admitting: Nurse Practitioner

## 2015-07-02 VITALS — BP 138/80 | HR 52 | Temp 98.5°F | Resp 16 | Ht 62.0 in | Wt 102.4 lb

## 2015-07-02 DIAGNOSIS — R300 Dysuria: Secondary | ICD-10-CM

## 2015-07-02 LAB — POCT URINALYSIS DIPSTICK
Bilirubin, UA: NEGATIVE
Glucose, UA: NEGATIVE
Ketones, UA: NEGATIVE
Leukocytes, UA: NEGATIVE
NITRITE UA: NEGATIVE
Protein, UA: NEGATIVE
Spec Grav, UA: <=1.005
Urobilinogen, UA: 0.2
pH, UA: 5.5

## 2015-07-02 MED ORDER — ONDANSETRON HCL 4 MG PO TABS
4.0000 mg | ORAL_TABLET | Freq: Three times a day (TID) | ORAL | Status: DC | PRN
Start: 2015-07-02 — End: 2018-05-16

## 2015-07-02 NOTE — Assessment & Plan Note (Signed)
Not improved after 3 weeks and after UTI tx from a walk-in clinic. Pt looks well and is not currently experiencing nausea (refilled zofran just in case), abdominal pain, or flank pain on left. POCT urine shows moderate blood and low spec. Gravity. Sent for urine culture. Discussed possible concerns like resistance to cipro and possible nephrolithiasis. Pt refuses CT scan at this time and reports she will wait on culture results. Pt was encouraged to drink water, take Azo and zofran for symptoms of dysuria and nausea. Questions were answered to satisfaction. Will follow w/ results.

## 2015-07-02 NOTE — Patient Instructions (Signed)

## 2015-07-02 NOTE — Progress Notes (Signed)
   Subjective:    Patient ID: Sherry Petersen, female    DOB: 1961/10/28, 54 y.o.   MRN: [id number]  HPI  Sherry Petersen is a 54 yo female with a follow up of UTI.   1) Went to UC 3 weeks ago and was treated for UTI. Helped at first.  Lower abdominal pain, left side back pain, nauseated, after eating reports loose stools. Denies heart burn. She reports blood in urine for years and has been checked out by urology years ago with no significant findings. Pain comes and goes, nausea comes and goes. Reports lower abdominal pain bilaterally this am, but went away soon after.   Finished the antibiotic- Cipro she thinks  Stopped Azo   Review of Systems  Constitutional: Negative for fever, chills, diaphoresis and fatigue.  Respiratory: Negative for chest tightness, shortness of breath and wheezing.   Cardiovascular: Negative for chest pain, palpitations and leg swelling.  Gastrointestinal: Positive for abdominal pain. Negative for nausea, vomiting and diarrhea.  Genitourinary: Positive for dysuria, frequency and flank pain. Negative for urgency, hematuria, decreased urine volume, vaginal bleeding, vaginal discharge, difficulty urinating, vaginal pain and pelvic pain.  Skin: Negative for rash.  Neurological: Negative for dizziness, weakness, numbness and headaches.      Objective:   Physical Exam  Constitutional: She is oriented to person, place, and time. She appears well-developed and well-nourished. No distress.  BP 138/80 mmHg  Pulse 52  Temp(Src) 98.5 F (36.9 C)  Resp 16  Ht 5\' 2"  (1.575 m)  Wt 102 lb 6.4 oz (46.448 kg)  BMI 18.72 kg/m2  SpO2 97%   HENT:  Head: Normocephalic and atraumatic.  Right Ear: External ear normal.  Left Ear: External ear normal.  Abdominal: Soft. Bowel sounds are normal. She exhibits no distension and no mass. There is no tenderness. There is no rebound, no guarding and no CVA tenderness.  Neurological: She is alert and oriented to person, place, and time. No  cranial nerve deficit. She exhibits normal muscle tone. Coordination normal.  Skin: Skin is warm and dry. No rash noted. She is not diaphoretic.  Psychiatric: She has a normal mood and affect. Her behavior is normal. Judgment and thought content normal.      Assessment & Plan:

## 2015-07-02 NOTE — Progress Notes (Signed)
Pre visit review using our clinic review tool, if applicable. No additional management support is needed unless otherwise documented below in the visit note. 

## 2015-07-04 LAB — URINE CULTURE: Colony Count: 3000

## 2015-08-01 ENCOUNTER — Other Ambulatory Visit: Payer: Self-pay | Admitting: Internal Medicine

## 2015-08-02 NOTE — Telephone Encounter (Signed)
Last OV with MD 5/16 ok to fill? alprazolam

## 2015-08-02 NOTE — Telephone Encounter (Signed)
Ok to refill,  printed rx  

## 2015-08-05 NOTE — Telephone Encounter (Signed)
Script faxed.

## 2015-08-29 ENCOUNTER — Encounter: Payer: Self-pay | Admitting: Internal Medicine

## 2015-08-29 ENCOUNTER — Ambulatory Visit (INDEPENDENT_AMBULATORY_CARE_PROVIDER_SITE_OTHER): Payer: 59 | Admitting: Internal Medicine

## 2015-08-29 ENCOUNTER — Other Ambulatory Visit (HOSPITAL_COMMUNITY)
Admission: RE | Admit: 2015-08-29 | Discharge: 2015-08-29 | Disposition: A | Discharge status: Home / Self Care | Payer: 59 | Source: Ambulatory Visit | Attending: Internal Medicine | Admitting: Internal Medicine

## 2015-08-29 VITALS — BP 160/92 | HR 55 | Temp 98.5°F | Resp 12 | Ht 62.0 in | Wt 99.2 lb

## 2015-08-29 DIAGNOSIS — R300 Dysuria: Secondary | ICD-10-CM | POA: Diagnosis not present

## 2015-08-29 DIAGNOSIS — Z113 Encounter for screening for infections with a predominantly sexual mode of transmission: Secondary | ICD-10-CM

## 2015-08-29 DIAGNOSIS — Z01419 Encounter for gynecological examination (general) (routine) without abnormal findings: Secondary | ICD-10-CM | POA: Insufficient documentation

## 2015-08-29 DIAGNOSIS — R109 Unspecified abdominal pain: Secondary | ICD-10-CM

## 2015-08-29 DIAGNOSIS — I10 Essential (primary) hypertension: Secondary | ICD-10-CM | POA: Insufficient documentation

## 2015-08-29 DIAGNOSIS — Z1151 Encounter for screening for human papillomavirus (HPV): Secondary | ICD-10-CM | POA: Diagnosis not present

## 2015-08-29 DIAGNOSIS — N760 Acute vaginitis: Secondary | ICD-10-CM | POA: Diagnosis not present

## 2015-08-29 DIAGNOSIS — Z124 Encounter for screening for malignant neoplasm of cervix: Secondary | ICD-10-CM

## 2015-08-29 DIAGNOSIS — E559 Vitamin D deficiency, unspecified: Secondary | ICD-10-CM

## 2015-08-29 DIAGNOSIS — R3129 Other microscopic hematuria: Secondary | ICD-10-CM | POA: Insufficient documentation

## 2015-08-29 DIAGNOSIS — R312 Other microscopic hematuria: Secondary | ICD-10-CM | POA: Diagnosis not present

## 2015-08-29 DIAGNOSIS — M5090 Cervical disc disorder, unspecified, unspecified cervical region: Secondary | ICD-10-CM

## 2015-08-29 LAB — LIPID PANEL
CHOL/HDL RATIO: 3
CHOLESTEROL: 192 mg/dL (ref 0–200)
HDL: 60.3 mg/dL (ref >39.00)
LDL Cholesterol: 120 mg/dL — ABNORMAL HIGH (ref 0–99)
NonHDL: 131.43
Triglycerides: 55 mg/dL (ref 0.0–149.0)
VLDL: 11 mg/dL (ref 0.0–40.0)

## 2015-08-29 LAB — MICROALBUMIN / CREATININE URINE RATIO
Creatinine,U: 20.3 mg/dL
Microalb Creat Ratio: 3.5 mg/g (ref 0.0–30.0)

## 2015-08-29 LAB — COMPREHENSIVE METABOLIC PANEL
ALT: 7 U/L (ref 0–35)
AST: 15 U/L (ref 0–37)
Albumin: 4.3 g/dL (ref 3.5–5.2)
Alkaline Phosphatase: 78 U/L (ref 39–117)
BUN: 14 mg/dL (ref 6–23)
CALCIUM: 9.4 mg/dL (ref 8.4–10.5)
CHLORIDE: 109 meq/L (ref 96–112)
CO2: 25 meq/L (ref 19–32)
Creatinine, Ser: 0.69 mg/dL (ref 0.40–1.20)
GFR: 93.32 mL/min (ref >60.00)
Glucose, Bld: 74 mg/dL (ref 70–99)
Potassium: 4 mEq/L (ref 3.5–5.1)
Sodium: 145 mEq/L (ref 135–145)
Total Bilirubin: 0.3 mg/dL (ref 0.2–1.2)
Total Protein: 6.3 g/dL (ref 6.0–8.3)

## 2015-08-29 LAB — CBC WITH DIFFERENTIAL/PLATELET
Basophils Absolute: 0.1 10*3/uL (ref 0.0–0.1)
Basophils Relative: 0.7 % (ref 0.0–3.0)
EOS ABS: 0.1 10*3/uL (ref 0.0–0.7)
Eosinophils Relative: 1 % (ref 0.0–5.0)
HEMATOCRIT: 41.9 % (ref 36.0–46.0)
Hemoglobin: 13.6 g/dL (ref 12.0–15.0)
LYMPHS ABS: 1.6 10*3/uL (ref 0.7–4.0)
Lymphocytes Relative: 18.6 % (ref 12.0–46.0)
MCHC: 32.4 g/dL (ref 30.0–36.0)
MCV: 94 fl (ref 78.0–100.0)
Monocytes Absolute: 0.4 10*3/uL (ref 0.1–1.0)
Monocytes Relative: 4.9 % (ref 3.0–12.0)
NEUTROS ABS: 6.5 10*3/uL (ref 1.4–7.7)
Neutrophils Relative %: 74.8 % (ref 43.0–77.0)
PLATELETS: 233 10*3/uL (ref 150.0–400.0)
RBC: 4.46 Mil/uL (ref 3.87–5.11)
RDW: 14 % (ref 11.5–15.5)
WBC: 8.7 10*3/uL (ref 4.0–10.5)

## 2015-08-29 LAB — URINALYSIS, ROUTINE W REFLEX MICROSCOPIC
BILIRUBIN URINE: NEGATIVE
KETONES UR: NEGATIVE
Leukocytes, UA: NEGATIVE
Nitrite: NEGATIVE
Total Protein, Urine: NEGATIVE
UROBILINOGEN UA: 0.2 (ref 0.0–1.0)
Urine Glucose: NEGATIVE
WBC, UA: NONE SEEN (ref 0–?)
pH: 5.5 (ref 5.0–8.0)

## 2015-08-29 LAB — POCT URINALYSIS DIPSTICK
Bilirubin, UA: NEGATIVE
Glucose, UA: NEGATIVE
Ketones, UA: NEGATIVE
LEUKOCYTES UA: NEGATIVE
NITRITE UA: NEGATIVE
PH UA: 5.5
Protein, UA: NEGATIVE
Spec Grav, UA: <=1.005
UROBILINOGEN UA: 0.2

## 2015-08-29 LAB — TSH: TSH: 0.9 u[IU]/mL (ref 0.35–4.50)

## 2015-08-29 LAB — VITAMIN D 25 HYDROXY (VIT D DEFICIENCY, FRACTURES): VITD: 38.97 ng/mL (ref 30.00–100.00)

## 2015-08-29 MED ORDER — TRAMADOL HCL 50 MG PO TABS: 50.0000 mg | ORAL_TABLET | Freq: Two times a day (BID) | ORAL | Status: DC | PRN

## 2015-08-29 MED ORDER — AMLODIPINE BESYLATE 5 MG PO TABS: 5.0000 mg | ORAL_TABLET | Freq: Every day | ORAL | Status: DC

## 2015-08-29 NOTE — Progress Notes (Signed)
Subjective:  Patient ID: Sherry Petersen, female    DOB: 20-Feb-1961  Age: 54 y.o. MRN: [id number]  CC: The primary encounter diagnosis was Dysuria. Diagnoses of Hematuria, microscopic, Vaginitis and vulvovaginitis, Essential hypertension, Pap smear for cervical cancer screening, Screen for STD (sexually transmitted disease), Vitamin D deficiency, Bilateral flank pain, and Cervical neck pain with evidence of disc disease were also pertinent to this visit.  HPI SWARA DONZE presents for HER annual exam but she has multiple acute issues that need attention   1) SHE HAS been blood in urine since June.  Treated for UTI at [facility] and treated with cipro for 5 days ,  Symptoms improved transiently but returned in July to [place] .  Persistent hematuria without infection.  Smoker,  Not sure if aggravated by sex,  No vaginal discharge but has burning sensation with and without urination. History of  Supracervical hysterectomy,   Having intercourse with husband . , and marital infidelity is suspected by patient .    2) elevated blood pressure  3) neck pain  Worse in Am radiates to right arm to elbow throbbing   MRi normal 2013.  Sleeps on stomach    4) Does not want flu shot   Outpatient Prescriptions Prior to Visit  Medication Sig Dispense Refill  . ALPRAZolam (XANAX) 1 MG tablet TAKE 1 TABLET BY MOUTH EVERY NIGHT AT BEDTIME AS NEEDED FOR ANXIETY 30 tablet 3  . cholecalciferol (VITAMIN D) 1000 UNITS tablet Take 5,000 Units by mouth daily.    . Linaclotide (LINZESS) 145 MCG CAPS capsule Take 1 capsule (145 mcg total) by mouth daily. 30 capsule 11  . ondansetron (ZOFRAN) 4 MG tablet Take 1 tablet (4 mg total) by mouth every 8 (eight) hours as needed for nausea or vomiting. 20 tablet 3  . predniSONE (DELTASONE) 10 MG tablet 6 tablets on Day 1 , then reduce by 1 tablet daily until gone (Patient not taking: Reported on 07/02/2015) 21 tablet 0   No facility-administered medications prior to visit.     Review of Systems;  Patient denies headache, fevers, malaise, unintentional weight loss, skin rash, eye pain, sinus congestion and sinus pain, sore throat, dysphagia,  hemoptysis , cough, dyspnea, wheezing, chest pain, palpitations, orthopnea, edema, abdominal pain, nausea, melena, diarrhea, constipation, flank pain, dysuria, hematuria, urinary  Frequency, nocturia, numbness, tingling, seizures,  Focal weakness, Loss of consciousness,  Tremor, insomnia, depression, anxiety, and suicidal ideation.      Objective:  BP 160/92 mmHg  Pulse 55  Temp(Src) 98.5 F (36.9 C) (Oral)  Resp 12  Ht 5\' 2"  (1.575 m)  Wt 99 lb 4 oz (45.02 kg)  BMI 18.15 kg/m2  SpO2 98%  BP Readings from Last 3 Encounters:  08/29/15 160/92  07/02/15 138/80  04/26/15 134/72    Wt Readings from Last 3 Encounters:  08/29/15 99 lb 4 oz (45.02 kg)  07/02/15 102 lb 6.4 oz (46.448 kg)  04/26/15 100 lb 12 oz (45.7 kg)   General Appearance:    Alert, cooperative, no distress, appears stated age  Head:    Normocephalic, without obvious abnormality, atraumatic     Throat:   Lips, mucosa, and tongue normal; teeth and gums normal  Neck:   Supple, symmetrical, trachea midline, no adenopathy;    thyroid:  no enlargement/tenderness/nodules; no carotid   bruit or JVD  Back:     Symmetric, no curvature, ROM normal, no CVA tenderness  Lungs:     Clear to auscultation  bilaterally, respirations unlabored  Chest Wall:    No tenderness or deformity   Heart:    Regular rate and rhythm, S1 and S2 normal, no murmur, rub   or gallop  Breast Exam:    No tenderness, masses, or nipple abnormality  Abdomen:     Soft, non-tender, bowel sounds active all four quadrants,    no masses, no organomegaly  Genitalia:    Pelvic: cervix normal in appearance, external genitalia normal, no adnexal masses or tenderness, no cervical motion tenderness, rectovaginal septum normal, uterus surgically absent , vagina normal without discharge   Extremities:   Extremities normal, atraumatic, no cyanosis or edema  Pulses:   2+ and symmetric all extremities  Skin:   Skin color, texture, turgor normal, no rashes or lesions  Lymph nodes:   Cervical, supraclavicular, and axillary nodes normal  Neurologic:   CNII-XII intact, normal strength, sensation and reflexes    throughout    Lab Results  Component Value Date   HGBA1C 6.4 03/14/2012    Lab Results  Component Value Date   CREATININE 0.69 08/29/2015   CREATININE 0.65 01/24/2015   CREATININE 0.7 05/30/2014    Lab Results  Component Value Date   WBC 8.7 08/29/2015   HGB 13.6 08/29/2015   HCT 41.9 08/29/2015   PLT 233.0 08/29/2015   GLUCOSE 74 08/29/2015   CHOL 192 08/29/2015   TRIG 55.0 08/29/2015   HDL 60.30 08/29/2015   LDLDIRECT 133.0 02/08/2013   LDLCALC 120* 08/29/2015   ALT 7 08/29/2015   AST 15 08/29/2015   NA 145 08/29/2015   K 4.0 08/29/2015   CL 109 08/29/2015   CREATININE 0.69 08/29/2015   BUN 14 08/29/2015   CO2 25 08/29/2015   TSH 0.90 08/29/2015   HGBA1C 6.4 03/14/2012   MICROALBUR <0.7 08/29/2015    Dg Chest 2 View  01/24/2015   CLINICAL DATA:  Weight loss in a smoker.  EXAM: CHEST  2 VIEW  COMPARISON:  None.  FINDINGS: The chest is hyperexpanded with attenuation of the pulmonary vasculature consistent with emphysema. Nodular opacities projecting over the lower lung zones are seen bilaterally and likely represent the patient's nipples. No consolidative process, pneumothorax or effusion is identified. Heart size is normal.  IMPRESSION: Nodular opacity seen on the PA view are likely the patient's nipples. This could be confirmed with repeat PA view with nipple markers in place.  Emphysema.   Electronically Signed   By: Inge Rise M.D.   On: 01/24/2015 17:05    Assessment & Plan:   Problem List Items Addressed This Visit      Unprioritized   Hematuria, microscopic    Recurrent, without infection.  Tobacco history raises her risk of renal  cell CA and bladder Ca.  Referral to Urology advised and accepted, and CT rnela stone protocol has been ordered.       Relevant Orders   CBC with Differential/Platelet (Completed)   CT RENAL STONE STUDY   Vaginitis and vulvovaginitis    Pelvic exam was unremarkable today, but PAP was done and sent for STDS screening given patient's marital issues and symptoms.       Hypertension    Starting amlodipine 5 mg daily .   Lab Results  Component Value Date   CREATININE 0.69 08/29/2015   Lab Results  Component Value Date   NA 145 08/29/2015   K 4.0 08/29/2015   CL 109 08/29/2015   CO2 25 08/29/2015  Relevant Medications   amLODipine (NORVASC) 5 MG tablet   Other Relevant Orders   Comprehensive metabolic panel   TSH (Completed)   Lipid panel (Completed)   Microalbumin / creatinine urine ratio   Cervical neck pain with evidence of disc disease    Chronic, but worsening.  MR in 2013 was negative for stenosis or disk protrusion. Tramadol and cervical support pillow.       Relevant Medications   traMADol (ULTRAM) 50 MG tablet   Dysuria - Primary   Relevant Orders   POCT Urinalysis Dipstick (Completed)   Urine Culture (Completed)   Urinalysis, Routine w reflex microscopic (Completed)   Cytology - PAP   CT RENAL STONE STUDY    Other Visit Diagnoses    Pap smear for cervical cancer screening        Screen for STD (sexually transmitted disease)        Relevant Orders    Hepatitis C antibody (Completed)    HIV antibody (with reflex) (Completed)    Vitamin D deficiency        Relevant Orders    Vit D  25 hydroxy (rtn osteoporosis monitoring)    Bilateral flank pain        Relevant Orders    CT RENAL STONE STUDY     A total of 40 minutes was spent with patient more than half of which was spent in counseling patient on the above mentioned issues , reviewing and explaining recent labs and imaging studies done, and coordination of care.   I have discontinued Ms.  Stimpson's predniSONE. I am also having her start on amLODipine and traMADol. Additionally, I am having her maintain her cholecalciferol, Linaclotide, ondansetron, and ALPRAZolam.  Meds ordered this encounter  Medications  . amLODipine (NORVASC) 5 MG tablet    Sig: Take 1 tablet (5 mg total) by mouth daily.    Dispense:  30 tablet    Refill:  2  . traMADol (ULTRAM) 50 MG tablet    Sig: Take 1 tablet (50 mg total) by mouth every 12 (twelve) hours as needed.    Dispense:  60 tablet    Refill:  2    Medications Discontinued During This Encounter  Medication Reason  . predniSONE (DELTASONE) 10 MG tablet Completed Course    Follow-up: No Follow-up on file.   Crecencio Mc, MD

## 2015-08-29 NOTE — Progress Notes (Signed)
Pre-visit discussion using our clinic review tool. No additional management support is needed unless otherwise documented below in the visit note.  

## 2015-08-29 NOTE — Patient Instructions (Signed)
You need to have a CT scan followed by Urology evaluation if no stones are seen  Starting amlodipine 5 mg daily for high blood pressure  starting tramadol for neck pian,  Get a cervical support plilow  Return in one month for annual exam

## 2015-08-30 LAB — HIV ANTIBODY (ROUTINE TESTING W REFLEX): HIV: NONREACTIVE

## 2015-08-30 LAB — URINE CULTURE
Colony Count: NO GROWTH
Organism ID, Bacteria: NO GROWTH

## 2015-08-30 LAB — HEPATITIS C ANTIBODY: HCV Ab: NEGATIVE

## 2015-08-31 DIAGNOSIS — M5090 Cervical disc disorder, unspecified, unspecified cervical region: Secondary | ICD-10-CM | POA: Insufficient documentation

## 2015-08-31 NOTE — Assessment & Plan Note (Signed)
Chronic, but worsening.  MR in 2013 was negative for stenosis or disk protrusion. Tramadol and cervical support pillow.

## 2015-08-31 NOTE — Assessment & Plan Note (Signed)
Pelvic exam was unremarkable today, but PAP was done and sent for STDS screening given patient's marital issues and symptoms.

## 2015-08-31 NOTE — Assessment & Plan Note (Signed)
Recurrent, without infection.  Tobacco history raises her risk of renal cell CA and bladder Ca.  Referral to Urology advised and accepted, and CT rnela stone protocol has been ordered.

## 2015-08-31 NOTE — Assessment & Plan Note (Signed)
Starting amlodipine 5 mg daily .   Lab Results  Component Value Date   CREATININE 0.69 08/29/2015   Lab Results  Component Value Date   NA 145 08/29/2015   K 4.0 08/29/2015   CL 109 08/29/2015   CO2 25 08/29/2015

## 2015-09-01 LAB — CERVICOVAGINAL ANCILLARY ONLY: CANDIDA VAGINITIS: NEGATIVE

## 2015-09-02 ENCOUNTER — Ambulatory Visit
Admission: RE | Admit: 2015-09-02 | Discharge: 2015-09-02 | Disposition: A | Discharge status: Home / Self Care | Payer: 59 | Source: Ambulatory Visit | Attending: Internal Medicine | Admitting: Internal Medicine

## 2015-09-02 DIAGNOSIS — Z90710 Acquired absence of both cervix and uterus: Secondary | ICD-10-CM | POA: Insufficient documentation

## 2015-09-02 DIAGNOSIS — R312 Other microscopic hematuria: Secondary | ICD-10-CM | POA: Insufficient documentation

## 2015-09-02 DIAGNOSIS — R109 Unspecified abdominal pain: Secondary | ICD-10-CM | POA: Diagnosis present

## 2015-09-02 DIAGNOSIS — R3129 Other microscopic hematuria: Secondary | ICD-10-CM

## 2015-09-02 DIAGNOSIS — I708 Atherosclerosis of other arteries: Secondary | ICD-10-CM | POA: Insufficient documentation

## 2015-09-02 DIAGNOSIS — R300 Dysuria: Secondary | ICD-10-CM | POA: Insufficient documentation

## 2015-09-02 LAB — CERVICOVAGINAL ANCILLARY ONLY: Herpes: NEGATIVE

## 2015-09-02 LAB — CYTOLOGY - PAP

## 2015-09-03 ENCOUNTER — Encounter: Payer: Self-pay | Admitting: Internal Medicine

## 2015-09-03 ENCOUNTER — Other Ambulatory Visit: Payer: Self-pay | Admitting: Internal Medicine

## 2015-09-03 DIAGNOSIS — N760 Acute vaginitis: Secondary | ICD-10-CM

## 2015-09-03 DIAGNOSIS — B9689 Other specified bacterial agents as the cause of diseases classified elsewhere: Secondary | ICD-10-CM | POA: Insufficient documentation

## 2015-09-03 MED ORDER — METRONIDAZOLE 500 MG PO TABS: 500.0000 mg | ORAL_TABLET | Freq: Two times a day (BID) | ORAL | Status: DC

## 2015-09-03 MED ORDER — LACTULOSE 20 GM/30ML PO SOLN: ORAL | Status: DC

## 2015-09-05 ENCOUNTER — Telehealth: Payer: Self-pay | Admitting: Internal Medicine

## 2015-09-05 DIAGNOSIS — E785 Hyperlipidemia, unspecified: Secondary | ICD-10-CM

## 2015-09-05 DIAGNOSIS — Z79899 Other long term (current) drug therapy: Secondary | ICD-10-CM

## 2015-09-05 MED ORDER — ATORVASTATIN CALCIUM 20 MG PO TABS: 20.0000 mg | ORAL_TABLET | Freq: Every day | ORAL | Status: DC

## 2015-09-05 NOTE — Telephone Encounter (Signed)
rx for atorvastatin sent to pharamcy. Please ask her to return in 6 weeks for nonfasting labs  On or around Nov 7(CMET entered)

## 2015-09-05 NOTE — Telephone Encounter (Signed)
Patient agrees to cholesterol medication.

## 2015-09-05 NOTE — Telephone Encounter (Signed)
Pt called to respond to Dr Derrel Nip stating yes to the cholestrol medication. Pharmacy is [facility] in [place]. Thank You!

## 2015-10-02 ENCOUNTER — Ambulatory Visit (INDEPENDENT_AMBULATORY_CARE_PROVIDER_SITE_OTHER): Payer: 59 | Admitting: Internal Medicine

## 2015-10-02 ENCOUNTER — Encounter: Payer: Self-pay | Admitting: Internal Medicine

## 2015-10-02 VITALS — BP 138/84 | HR 60 | Temp 98.6°F | Resp 12 | Ht 61.75 in | Wt 98.5 lb

## 2015-10-02 DIAGNOSIS — Z1239 Encounter for other screening for malignant neoplasm of breast: Secondary | ICD-10-CM | POA: Diagnosis not present

## 2015-10-02 DIAGNOSIS — F5105 Insomnia due to other mental disorder: Secondary | ICD-10-CM

## 2015-10-02 DIAGNOSIS — I2510 Atherosclerotic heart disease of native coronary artery without angina pectoris: Secondary | ICD-10-CM | POA: Diagnosis not present

## 2015-10-02 DIAGNOSIS — Z79899 Other long term (current) drug therapy: Secondary | ICD-10-CM

## 2015-10-02 DIAGNOSIS — M8580 Other specified disorders of bone density and structure, unspecified site: Secondary | ICD-10-CM | POA: Diagnosis not present

## 2015-10-02 DIAGNOSIS — Z716 Tobacco abuse counseling: Secondary | ICD-10-CM

## 2015-10-02 DIAGNOSIS — F419 Anxiety disorder, unspecified: Secondary | ICD-10-CM

## 2015-10-02 DIAGNOSIS — Z0000 Encounter for general adult medical examination without abnormal findings: Secondary | ICD-10-CM | POA: Diagnosis not present

## 2015-10-02 DIAGNOSIS — R634 Abnormal weight loss: Secondary | ICD-10-CM

## 2015-10-02 DIAGNOSIS — E559 Vitamin D deficiency, unspecified: Secondary | ICD-10-CM | POA: Diagnosis not present

## 2015-10-02 DIAGNOSIS — Z113 Encounter for screening for infections with a predominantly sexual mode of transmission: Secondary | ICD-10-CM

## 2015-10-02 LAB — COMPREHENSIVE METABOLIC PANEL
ALT: 10 U/L (ref 0–35)
AST: 17 U/L (ref 0–37)
Albumin: 4.3 g/dL (ref 3.5–5.2)
Alkaline Phosphatase: 84 U/L (ref 39–117)
BUN: 14 mg/dL (ref 6–23)
CO2: 27 meq/L (ref 19–32)
Calcium: 9.6 mg/dL (ref 8.4–10.5)
Chloride: 108 mEq/L (ref 96–112)
Creatinine, Ser: 0.65 mg/dL (ref 0.40–1.20)
GFR: 99.94 mL/min (ref >60.00)
GLUCOSE: 93 mg/dL (ref 70–99)
POTASSIUM: 4.1 meq/L (ref 3.5–5.1)
Sodium: 145 mEq/L (ref 135–145)
Total Bilirubin: 0.3 mg/dL (ref 0.2–1.2)
Total Protein: 6.8 g/dL (ref 6.0–8.3)

## 2015-10-02 LAB — LIPID PANEL
CHOL/HDL RATIO: 3
Cholesterol: 178 mg/dL (ref 0–200)
HDL: 68 mg/dL (ref >39.00)
LDL CALC: 100 mg/dL — AB (ref 0–99)
NONHDL: 110.31
Triglycerides: 52 mg/dL (ref 0.0–149.0)
VLDL: 10.4 mg/dL (ref 0.0–40.0)

## 2015-10-02 LAB — LDL CHOLESTEROL, DIRECT: Direct LDL: 94 mg/dL

## 2015-10-02 LAB — VITAMIN D 25 HYDROXY (VIT D DEFICIENCY, FRACTURES): VITD: 35.45 ng/mL (ref 30.00–100.00)

## 2015-10-02 MED ORDER — AMITRIPTYLINE HCL 100 MG PO TABS: 100.0000 mg | ORAL_TABLET | Freq: Every day | ORAL | Status: DC

## 2015-10-02 MED ORDER — AMLODIPINE BESYLATE 10 MG PO TABS: 10.0000 mg | ORAL_TABLET | Freq: Every day | ORAL | Status: DC

## 2015-10-02 NOTE — Progress Notes (Signed)
Patient ID: Sherry Petersen, female    DOB: 1961/01/01  Age: 54 y.o. MRN: [id number]  The patient is here for annual  wellness examination and management of other chronic and acute problems.    Has reduced tobacco use  But less than a pack daily     The risk factors are reflected in the social history.  The roster of all physicians providing medical care to patient - is listed in the Snapshot section of the chart.  Activities of daily living:  The patient is 100% independent in all ADLs: dressing, toileting, feeding as well as independent mobility  Home safety : The patient has smoke detectors in the home. They wear seatbelts.  There are no firearms at home. There is no violence in the home.   There is no risks for hepatitis, STDs or HIV. There is no   history of blood transfusion. They have no travel history to infectious disease endemic areas of the world.  The patient has seen their dentist in the last six month. They have seen their eye doctor in the last year. They admit to slight hearing difficulty with regard to whispered voices and some television programs.  They have deferred audiologic testing in the last year.  They do not  have excessive sun exposure. Discussed the need for sun protection: hats, long sleeves and use of sunscreen if there is significant sun exposure.   Does not exercise .  She skips breakfast on a daily basis  hey do have a healthy diet.  The benefits of regular aerobic exercise were discussed. She walks 4 times per week ,  20 minutes.   Depression screen: there are no signs or vegative symptoms of depression- irritability, change in appetite, anhedonia, sadness/tearfullness.  Cognitive assessment: the patient manages all their financial and personal affairs and is actively engaged. They could relate day,date,year and events; recalled 2/3 objects at 3 minutes; performed clock-face test normally.  The following portions of the patient's history were reviewed and  updated as appropriate: allergies, current medications, past family history, past medical history,  past surgical history, past social history  and problem list.  Visual acuity was not assessed per patient preference since she has regular follow up with her ophthalmologist. Hearing and body mass index were assessed and reviewed.   During the course of the visit the patient was educated and counseled about appropriate screening and preventive services including : fall prevention , diabetes screening, nutrition counseling, colorectal cancer screening, and recommended immunizations.    CC: The primary encounter diagnosis was Vitamin D deficiency. Diagnoses of Atherosclerosis of native coronary artery of native heart without angina pectoris, Screen for STD (sexually transmitted disease), Long-term use of high-risk medication, Breast cancer screening, Osteopenia, Encounter for preventive health examination, Insomnia secondary to anxiety, Loss of weight, and Tobacco abuse counseling were also pertinent to this visit.  History [name] has a past medical history of Hematuria and Anxiety.   She has past surgical history that includes Carpal tunnel release (2003); Vaginal hysterectomy (2002); and Tonsillectomy (2012).   Her family history includes Diabetes in her mother; Early death in her father; Heart attack in her brother, father, and sister; Heart disease in her father; Mental illness in her mother.She reports that she has been smoking Cigarettes.  She has a 7.5 pack-year smoking history. She has never used smokeless tobacco. She reports that she does not drink alcohol or use illicit drugs.  Outpatient Prescriptions Prior to Visit  Medication Sig Dispense Refill  .  ALPRAZolam (XANAX) 1 MG tablet TAKE 1 TABLET BY MOUTH EVERY NIGHT AT BEDTIME AS NEEDED FOR ANXIETY 30 tablet 3  . atorvastatin (LIPITOR) 20 MG tablet Take 1 tablet (20 mg total) by mouth daily. 90 tablet 3  . cholecalciferol (VITAMIN D) 1000  UNITS tablet Take 5,000 Units by mouth daily.    . Lactulose 20 GM/30ML SOLN 30 ml every 4 hours until constipation is relieved 236 mL 3  . Linaclotide (LINZESS) 145 MCG CAPS capsule Take 1 capsule (145 mcg total) by mouth daily. 30 capsule 11  . ondansetron (ZOFRAN) 4 MG tablet Take 1 tablet (4 mg total) by mouth every 8 (eight) hours as needed for nausea or vomiting. 20 tablet 3  . traMADol (ULTRAM) 50 MG tablet Take 1 tablet (50 mg total) by mouth every 12 (twelve) hours as needed. 60 tablet 2  . amLODipine (NORVASC) 5 MG tablet Take 1 tablet (5 mg total) by mouth daily. 30 tablet 2  . metroNIDAZOLE (FLAGYL) 500 MG tablet Take 1 tablet (500 mg total) by mouth 2 (two) times daily. (Patient not taking: Reported on 10/02/2015) 14 tablet 0   No facility-administered medications prior to visit.    Review of Systems   Patient denies headache, fevers, malaise, unintentional weight loss, skin rash, eye pain, sinus congestion and sinus pain, sore throat, dysphagia,  hemoptysis , cough, dyspnea, wheezing, chest pain, palpitations, orthopnea, edema, abdominal pain, nausea, melena, diarrhea, constipation, flank pain, dysuria, hematuria, urinary  Frequency, nocturia, numbness, tingling, seizures,  Focal weakness, Loss of consciousness,  Tremor, insomnia, depression, anxiety, and suicidal ideation.      Objective:  BP 138/84 mmHg  Pulse 60  Temp(Src) 98.6 F (37 C) (Oral)  Resp 12  Ht 5' 1.75" (1.568 m)  Wt 98 lb 8 oz (44.679 kg)  BMI 18.17 kg/m2  SpO2 95%  Physical Exam   General appearance: alert, cooperative and appears stated age Head: Normocephalic, without obvious abnormality, atraumatic Eyes: conjunctivae/corneas clear. PERRL, EOM's intact. Fundi benign. Ears: normal TM's and external ear canals both ears Nose: Nares normal. Septum midline. Mucosa normal. No drainage or sinus tenderness. Throat: lips, mucosa, and tongue normal; teeth and gums normal Neck: no adenopathy, no carotid  bruit, no JVD, supple, symmetrical, trachea midline and thyroid not enlarged, symmetric, no tenderness/mass/nodules Lungs: clear to auscultation bilaterally Breasts: normal appearance, no masses or tenderness Heart: regular rate and rhythm, S1, S2 normal, no murmur, click, rub or gallop Abdomen: soft, non-tender; bowel sounds normal; no masses,  no organomegaly Extremities: extremities normal, atraumatic, no cyanosis or edema Pulses: 2+ and symmetric Skin: Skin color, texture, turgor normal. No rashes or lesions Neurologic: Alert and oriented X 3, normal strength and tone. Normal symmetric reflexes. Normal coordination and gait.     Assessment & Plan:   Problem List Items Addressed This Visit    Insomnia secondary to anxiety    She is requesting to resume elavil despite prior adverse effect of constipation.  .         Relevant Medications   amitriptyline (ELAVIL) 100 MG tablet   Encounter for preventive health examination    Annual comprehensive preventive exam was done as well as an evaluation and management of chronic conditions .  During the course of the visit the patient was educated and counseled about appropriate screening and preventive services including :  diabetes screening, lipid analysis with projected  10 year  risk for CAD  Which is 4.7 % using the Framingham risk calculator for women, ,  nutrition counseling, colorectal cancer screening, and recommended immunizations.  Printed recommendations for health maintenance screenings was given.       Loss of weight    She is skipping breakfast.  Advised her to start using protein shakes for breakfast.       Tobacco abuse counseling    Smoking cessation instruction/counseling given:  counseled patient on the dangers of tobacco use, advised patient to stop smoking, and reviewed strategies to maximize success       Other Visit Diagnoses    Vitamin D deficiency    -  Primary    Relevant Orders    Vit D  25 hydroxy (rtn  osteoporosis monitoring) (Completed)    Atherosclerosis of native coronary artery of native heart without angina pectoris        Relevant Medications    amLODipine (NORVASC) 10 MG tablet    Other Relevant Orders    Lipid panel (Completed)    LDL cholesterol, direct (Completed)    Screen for STD (sexually transmitted disease)        Relevant Orders    HIV antibody (Completed)    Hepatitis C antibody (Completed)    Long-term use of high-risk medication        Relevant Orders    Comprehensive metabolic panel (Completed)    Breast cancer screening        Relevant Orders    MM DIGITAL SCREENING BILATERAL    Osteopenia        Relevant Orders    DG Bone Density       I have discontinued Ms. Wilmeth's metroNIDAZOLE. I have also changed her amLODipine. Additionally, I am having her start on amitriptyline. Lastly, I am having her maintain her cholecalciferol, Linaclotide, ondansetron, ALPRAZolam, traMADol, Lactulose, and atorvastatin.  Meds ordered this encounter  Medications  . amLODipine (NORVASC) 10 MG tablet    Sig: Take 1 tablet (10 mg total) by mouth daily.    Dispense:  90 tablet    Refill:  1  . amitriptyline (ELAVIL) 100 MG tablet    Sig: Take 1 tablet (100 mg total) by mouth at bedtime.    Dispense:  90 tablet    Refill:  1    Medications Discontinued During This Encounter  Medication Reason  . metroNIDAZOLE (FLAGYL) 500 MG tablet Completed Course  . amLODipine (NORVASC) 5 MG tablet Reorder    Follow-up: Return in about 6 months (around 04/01/2016).   Crecencio Mc, MD

## 2015-10-02 NOTE — Progress Notes (Signed)
Pre-visit discussion using our clinic review tool. No additional management support is needed unless otherwise documented below in the visit note.  

## 2015-10-02 NOTE — Patient Instructions (Signed)
Your blood pressure is not at goal.  I am increasing your amlodipine to 10 mg daily , new rx has been sent  I will order your mammogram and check to see if a Bone Density test is covered by your insurance  You can resume the amitriptyline at night,  Start with 1/2 tablet initially for a week  Please don't skip breakfast!! Drink a low carb protein shake :  Atkins  Engineer, civil (consulting)  Both are available at [employer],  [employer] and most grocery stores in the diet food section   Health Maintenance, Female Adopting a healthy lifestyle and getting preventive care can go a long way to promote health and wellness. Talk with your health care provider about what schedule of regular examinations is right for you. This is a good chance for you to check in with your provider about disease prevention and staying healthy. In between checkups, there are plenty of things you can do on your own. Experts have done a lot of research about which lifestyle changes and preventive measures are most likely to keep you healthy. Ask your health care provider for more information. WEIGHT AND DIET  Eat a healthy diet  Be sure to include plenty of vegetables, fruits, low-fat dairy products, and lean protein.  Do not eat a lot of foods high in solid fats, added sugars, or salt.  Get regular exercise. This is one of the most important things you can do for your health.  Most adults should exercise for at least 150 minutes each week. The exercise should increase your heart rate and make you sweat (moderate-intensity exercise).  Most adults should also do strengthening exercises at least twice a week. This is in addition to the moderate-intensity exercise.  Maintain a healthy weight  Body mass index (BMI) is a measurement that can be used to identify possible weight problems. It estimates body fat based on height and weight. Your health care provider can help determine your BMI and help you achieve or maintain a healthy  weight.  For females 59 years of age and older:   A BMI below 18.5 is considered underweight.  A BMI of 18.5 to 24.9 is normal.  A BMI of 25 to 29.9 is considered overweight.  A BMI of 30 and above is considered obese.  Watch levels of cholesterol and blood lipids  You should start having your blood tested for lipids and cholesterol at 57 years of age, then have this test every 5 years.  You may need to have your cholesterol levels checked more often if:  Your lipid or cholesterol levels are high.  You are older than 57 years of age.  You are at high risk for heart disease.  CANCER SCREENING   Lung Cancer  Lung cancer screening is recommended for adults 69-16 years old who are at high risk for lung cancer because of a history of smoking.  A yearly low-dose CT scan of the lungs is recommended for people who:  Currently smoke.  Have quit within the past 15 years.  Have at least a 30-pack-year history of smoking. A pack year is smoking an average of one pack of cigarettes a day for 1 year.  Yearly screening should continue until it has been 15 years since you quit.  Yearly screening should stop if you develop a health problem that would prevent you from having lung cancer treatment.  Breast Cancer  Practice breast self-awareness. This means understanding how your breasts normally appear and  feel.  It also means doing regular breast self-exams. Let your health care provider know about any changes, no matter how small.  If you are in your 20s or 30s, you should have a clinical breast exam (CBE) by a health care provider every 1-3 years as part of a regular health exam.  If you are 67 or older, have a CBE every year. Also consider having a breast X-ray (mammogram) every year.  If you have a family history of breast cancer, talk to your health care provider about genetic screening.  If you are at high risk for breast cancer, talk to your health care provider about  having an MRI and a mammogram every year.  Breast cancer gene (BRCA) assessment is recommended for women who have family members with BRCA-related cancers. BRCA-related cancers include:  Breast.  Ovarian.  Tubal.  Peritoneal cancers.  Results of the assessment will determine the need for genetic counseling and BRCA1 and BRCA2 testing. Cervical Cancer Your health care provider may recommend that you be screened regularly for cancer of the pelvic organs (ovaries, uterus, and vagina). This screening involves a pelvic examination, including checking for microscopic changes to the surface of your cervix (Pap test). You may be encouraged to have this screening done every 3 years, beginning at age 55.  For women ages 3-65, health care providers may recommend pelvic exams and Pap testing every 3 years, or they may recommend the Pap and pelvic exam, combined with testing for human papilloma virus (HPV), every 5 years. Some types of HPV increase your risk of cervical cancer. Testing for HPV may also be done on women of any age with unclear Pap test results.  Other health care providers may not recommend any screening for nonpregnant women who are considered low risk for pelvic cancer and who do not have symptoms. Ask your health care provider if a screening pelvic exam is right for you.  If you have had past treatment for cervical cancer or a condition that could lead to cancer, you need Pap tests and screening for cancer for at least 20 years after your treatment. If Pap tests have been discontinued, your risk factors (such as having a new sexual partner) need to be reassessed to determine if screening should resume. Some women have medical problems that increase the chance of getting cervical cancer. In these cases, your health care provider may recommend more frequent screening and Pap tests. Colorectal Cancer  This type of cancer can be detected and often prevented.  Routine colorectal cancer  screening usually begins at 56 years of age and continues through 56 years of age.  Your health care provider may recommend screening at an earlier age if you have risk factors for colon cancer.  Your health care provider may also recommend using home test kits to check for hidden blood in the stool.  A small camera at the end of a tube can be used to examine your colon directly (sigmoidoscopy or colonoscopy). This is done to check for the earliest forms of colorectal cancer.  Routine screening usually begins at age 54.  Direct examination of the colon should be repeated every 5-10 years through 56 years of age. However, you may need to be screened more often if early forms of precancerous polyps or small growths are found. Skin Cancer  Check your skin from head to toe regularly.  Tell your health care provider about any new moles or changes in moles, especially if there is a change  in a mole's shape or color.  Also tell your health care provider if you have a mole that is larger than the size of a pencil eraser.  Always use sunscreen. Apply sunscreen liberally and repeatedly throughout the day.  Protect yourself by wearing long sleeves, pants, a wide-brimmed hat, and sunglasses whenever you are outside. HEART DISEASE, DIABETES, AND HIGH BLOOD PRESSURE   High blood pressure causes heart disease and increases the risk of stroke. High blood pressure is more likely to develop in:  People who have blood pressure in the high end of the normal range (130-139/85-89 mm Hg).  People who are overweight or obese.  People who are African American.  If you are 30-82 years of age, have your blood pressure checked every 3-5 years. If you are 59 years of age or older, have your blood pressure checked every year. You should have your blood pressure measured twice--once when you are at a hospital or clinic, and once when you are not at a hospital or clinic. Record the average of the two measurements.  To check your blood pressure when you are not at a hospital or clinic, you can use:  An automated blood pressure machine at a pharmacy.  A home blood pressure monitor.  If you are between 87 years and 74 years old, ask your health care provider if you should take aspirin to prevent strokes.  Have regular diabetes screenings. This involves taking a blood sample to check your fasting blood sugar level.  If you are at a normal weight and have a low risk for diabetes, have this test once every three years after 56 years of age.  If you are overweight and have a high risk for diabetes, consider being tested at a younger age or more often. PREVENTING INFECTION  Hepatitis B  If you have a higher risk for hepatitis B, you should be screened for this virus. You are considered at high risk for hepatitis B if:  You were born in a country where hepatitis B is common. Ask your health care provider which countries are considered high risk.  Your parents were born in a high-risk country, and you have not been immunized against hepatitis B (hepatitis B vaccine).  You have HIV or AIDS.  You use needles to inject street drugs.  You live with someone who has hepatitis B.  You have had sex with someone who has hepatitis B.  You get hemodialysis treatment.  You take certain medicines for conditions, including cancer, organ transplantation, and autoimmune conditions. Hepatitis C  Blood testing is recommended for:  Everyone born from [birth year] through 1967.  Anyone with known risk factors for hepatitis C. Sexually transmitted infections (STIs)  You should be screened for sexually transmitted infections (STIs) including gonorrhea and chlamydia if:  You are sexually active and are younger than 56 years of age.  You are older than 56 years of age and your health care provider tells you that you are at risk for this type of infection.  Your sexual activity has changed since you were last screened and  you are at an increased risk for chlamydia or gonorrhea. Ask your health care provider if you are at risk.  If you do not have HIV, but are at risk, it may be recommended that you take a prescription medicine daily to prevent HIV infection. This is called pre-exposure prophylaxis (PrEP). You are considered at risk if:  You are sexually active and do not regularly use condoms  or know the HIV status of your partner(s).  You take drugs by injection.  You are sexually active with a partner who has HIV. Talk with your health care provider about whether you are at high risk of being infected with HIV. If you choose to begin PrEP, you should first be tested for HIV. You should then be tested every 3 months for as long as you are taking PrEP.  PREGNANCY   If you are premenopausal and you may become pregnant, ask your health care provider about preconception counseling.  If you may become pregnant, take 400 to 800 micrograms (mcg) of folic acid every day.  If you want to prevent pregnancy, talk to your health care provider about birth control (contraception). OSTEOPOROSIS AND MENOPAUSE   Osteoporosis is a disease in which the bones lose minerals and strength with aging. This can result in serious bone fractures. Your risk for osteoporosis can be identified using a bone density scan.  If you are 66 years of age or older, or if you are at risk for osteoporosis and fractures, ask your health care provider if you should be screened.  Ask your health care provider whether you should take a calcium or vitamin D supplement to lower your risk for osteoporosis.  Menopause may have certain physical symptoms and risks.  Hormone replacement therapy may reduce some of these symptoms and risks. Talk to your health care provider about whether hormone replacement therapy is right for you.  HOME CARE INSTRUCTIONS   Schedule regular health, dental, and eye exams.  Stay current with your immunizations.   Do  not use any tobacco products including cigarettes, chewing tobacco, or electronic cigarettes.  If you are pregnant, do not drink alcohol.  If you are breastfeeding, limit how much and how often you drink alcohol.  Limit alcohol intake to no more than 1 drink per day for nonpregnant women. One drink equals 12 ounces of beer, 5 ounces of wine, or 1 ounces of hard liquor.  Do not use street drugs.  Do not share needles.  Ask your health care provider for help if you need support or information about quitting drugs.  Tell your health care provider if you often feel depressed.  Tell your health care provider if you have ever been abused or do not feel safe at home.   This information is not intended to replace advice given to you by your health care provider. Make sure you discuss any questions you have with your health care provider.   Document Released: 06/15/2011 Document Revised: 12/21/2014 Document Reviewed: 11/01/2013 Elsevier Interactive Patient Education [employer].

## 2015-10-03 ENCOUNTER — Encounter: Payer: Self-pay | Admitting: Internal Medicine

## 2015-10-03 LAB — HEPATITIS C ANTIBODY: HCV AB: NEGATIVE

## 2015-10-03 LAB — HIV ANTIBODY (ROUTINE TESTING W REFLEX): HIV: NONREACTIVE

## 2015-10-03 NOTE — Assessment & Plan Note (Signed)
She is requesting to resume elavil despite prior adverse effect of constipation.  Marland Kitchen

## 2015-10-03 NOTE — Assessment & Plan Note (Signed)
Smoking cessation instruction/counseling given:  counseled patient on the dangers of tobacco use, advised patient to stop smoking, and reviewed strategies to maximize success 

## 2015-10-03 NOTE — Assessment & Plan Note (Signed)
She is skipping breakfast.  Advised her to start using protein shakes for breakfast.

## 2015-10-03 NOTE — Assessment & Plan Note (Signed)
Annual comprehensive preventive exam was done as well as an evaluation and management of chronic conditions .  During the course of the visit the patient was educated and counseled about appropriate screening and preventive services including :  diabetes screening, lipid analysis with projected  10 year  risk for CAD  Which is 4.7 % using the Framingham risk calculator for women, , nutrition counseling, colorectal cancer screening, and recommended immunizations.  Printed recommendations for health maintenance screenings was given.  

## 2015-10-15 ENCOUNTER — Ambulatory Visit
Admission: RE | Admit: 2015-10-15 | Discharge: 2015-10-15 | Disposition: A | Discharge status: Home / Self Care | Payer: 59 | Source: Ambulatory Visit | Attending: Internal Medicine | Admitting: Internal Medicine

## 2015-10-15 DIAGNOSIS — M8580 Other specified disorders of bone density and structure, unspecified site: Secondary | ICD-10-CM | POA: Insufficient documentation

## 2015-10-15 DIAGNOSIS — Z780 Asymptomatic menopausal state: Secondary | ICD-10-CM | POA: Diagnosis not present

## 2015-10-17 ENCOUNTER — Encounter: Payer: Self-pay | Admitting: Internal Medicine

## 2015-11-27 ENCOUNTER — Other Ambulatory Visit: Payer: Self-pay | Admitting: Internal Medicine

## 2015-11-27 MED ORDER — ALPRAZOLAM 1 MG PO TABS: 1.0000 mg | ORAL_TABLET | Freq: Every evening | ORAL | Status: DC | PRN

## 2015-11-27 NOTE — Telephone Encounter (Signed)
Ok to refill,  printed rx  

## 2015-12-21 LAB — CBC AND DIFFERENTIAL
HEMOGLOBIN: 13 g/dL (ref 12.0–16.0)
Platelets: 242 10*3/uL (ref 150–399)
WBC: 6.2 10^3/mL

## 2015-12-21 LAB — POCT ERYTHROCYTE SEDIMENTATION RATE, NON-AUTOMATED: Sed Rate: 4 mm

## 2015-12-25 ENCOUNTER — Telehealth: Payer: Self-pay | Admitting: Internal Medicine

## 2015-12-25 NOTE — Telephone Encounter (Signed)
The only appt available this week is 4:30 on Friday

## 2015-12-25 NOTE — Telephone Encounter (Signed)
Pt called wanting to inform the office she went to the ED for Testing pt for Lupus. Pt wanted to come in no appt avail to sch. Please let me know where to sch. Call pt @ [phone]. Thank You!

## 2015-12-25 NOTE — Telephone Encounter (Signed)
Please advise when you would like to be seen. Thanks

## 2015-12-26 NOTE — Telephone Encounter (Signed)
Spoke with the patient and scheduled for Friday at 430pm.

## 2015-12-27 ENCOUNTER — Ambulatory Visit (INDEPENDENT_AMBULATORY_CARE_PROVIDER_SITE_OTHER): Payer: 59 | Admitting: Internal Medicine

## 2015-12-27 VITALS — BP 128/72 | HR 65 | Temp 98.1°F | Resp 12 | Ht 61.0 in | Wt 104.5 lb

## 2015-12-27 DIAGNOSIS — M1990 Unspecified osteoarthritis, unspecified site: Secondary | ICD-10-CM

## 2015-12-27 DIAGNOSIS — R21 Rash and other nonspecific skin eruption: Secondary | ICD-10-CM

## 2015-12-27 MED ORDER — PREDNISONE 10 MG PO TABS: ORAL_TABLET | ORAL | Status: DC

## 2015-12-27 NOTE — Progress Notes (Signed)
Pre-visit discussion using our clinic review tool. No additional management support is needed unless otherwise documented below in the visit note.  

## 2015-12-27 NOTE — Patient Instructions (Signed)
Your test for Lupus was negative,  And there were no signs of inflammation   Your rash appears to be from a viral infection  I am prescribing a 6 days course of prednisone.  6 tablets all at once on Day 1,  Then taper by 1 tablet daily until gone

## 2015-12-27 NOTE — Progress Notes (Signed)
Subjective:  Patient ID: Sherry Petersen, female    DOB: [birth year]  Age: 55 y.o. MRN: [id number]  CC: The primary encounter diagnosis was Arthritis. A diagnosis of Rash and nonspecific skin eruption was also pertinent to this visit.  HPI Sherry Petersen presents for follow up on recent ER visit for facial rash that she was told may be due to SLE.  She was evaluated  at a [facility] ED ,  ANA drawn but not  resulted .  Has  A half sister with lupus   History :  Developed a desquamation of he skin  involving the periorbital skin, along with  Cheeks and chin.  No redness until Day 2.  . Just started peeling ,.  No recent detergent ,  mosturizers or makeup changes. Sought medicla attention on Day . Marland Kitchen  On ay 2 went to [facility] ED bc face was red and she felt "sick"  Headcold,  Sinus congestion, fevers (101 ,  On Tuesday night,  Then 99,  Up and down ) feels fine , then will suddenly feel fatigued,  Sleeping fine. . Knees and hands hurting but joints not warm to the touch.  Recent ER visit and labs reviewed via EPIC portal with patient.  ESR, CBC and ANA normal.       Outpatient Prescriptions Prior to Visit  Medication Sig Dispense Refill  . ALPRAZolam (XANAX) 1 MG tablet Take 1 tablet (1 mg total) by mouth at bedtime as needed for anxiety or sleep. 30 tablet 5  . amitriptyline (ELAVIL) 100 MG tablet Take 1 tablet (100 mg total) by mouth at bedtime. 90 tablet 1  . amLODipine (NORVASC) 10 MG tablet Take 1 tablet (10 mg total) by mouth daily. 90 tablet 1  . atorvastatin (LIPITOR) 20 MG tablet Take 1 tablet (20 mg total) by mouth daily. 90 tablet 3  . cholecalciferol (VITAMIN D) 1000 UNITS tablet Take 5,000 Units by mouth daily.    . Lactulose 20 GM/30ML SOLN 30 ml every 4 hours until constipation is relieved 236 mL 3  . Linaclotide (LINZESS) 145 MCG CAPS capsule Take 1 capsule (145 mcg total) by mouth daily. 30 capsule 11  . ondansetron (ZOFRAN) 4 MG tablet Take 1 tablet (4 mg total) by mouth  every 8 (eight) hours as needed for nausea or vomiting. 20 tablet 3  . traMADol (ULTRAM) 50 MG tablet Take 1 tablet (50 mg total) by mouth every 12 (twelve) hours as needed. 60 tablet 2   No facility-administered medications prior to visit.    Review of Systems;  Patient denies headache, fevers, malaise, unintentional weight loss, skin rash, eye pain, sinus congestion and sinus pain, sore throat, dysphagia,  hemoptysis , cough, dyspnea, wheezing, chest pain, palpitations, orthopnea, edema, abdominal pain, nausea, melena, diarrhea, constipation, flank pain, dysuria, hematuria, urinary  Frequency, nocturia, numbness, tingling, seizures,  Focal weakness, Loss of consciousness,  Tremor, insomnia, depression, anxiety, and suicidal ideation.      Objective:  BP 128/72 mmHg  Pulse 65  Temp(Src) 98.1 F (36.7 C) (Oral)  Resp 12  Ht 5' 1"  (1.549 m)  Wt 104 lb 8 oz (47.401 kg)  BMI 19.76 kg/m2  SpO2 93%  BP Readings from Last 3 Encounters:  12/27/15 128/72  10/02/15 138/84  08/29/15 160/92    Wt Readings from Last 3 Encounters:  12/27/15 104 lb 8 oz (47.401 kg)  10/02/15 98 lb 8 oz (44.679 kg)  08/29/15 99 lb 4 oz (45.02 kg)  General appearance: alert, cooperative and appears stated age Ears: normal TM's and external ear canals both ears Throat: lips, mucosa, and tongue normal; teeth and gums normal Neck: no adenopathy, no carotid bruit, supple, symmetrical, trachea midline and thyroid not enlarged, symmetric, no tenderness/mass/nodules Back: symmetric, no curvature. ROM normal. No CVA tenderness. Lungs: clear to auscultation bilaterally Heart: regular rate and rhythm, S1, S2 normal, no murmur, click, rub or gallop Abdomen: soft, non-tender; bowel sounds normal; no masses,  no organomegaly Pulses: 2+ and symmetric Skin: Skin color, texture, turgor normal. No rashes or lesions Lymph nodes: Cervical, supraclavicular, and axillary nodes normal.  Lab Results  Component Value Date     HGBA1C 6.4 03/14/2012    Lab Results  Component Value Date   CREATININE 0.65 10/02/2015   CREATININE 0.69 08/29/2015   CREATININE 0.65 01/24/2015    Lab Results  Component Value Date   WBC 6.2 12/21/2015   HGB 13.0 12/21/2015   HCT 41.9 08/29/2015   PLT 242 12/21/2015   GLUCOSE 93 10/02/2015   CHOL 178 10/02/2015   TRIG 52.0 10/02/2015   HDL 68.00 10/02/2015   LDLDIRECT 94.0 10/02/2015   LDLCALC 100* 10/02/2015   ALT 10 10/02/2015   AST 17 10/02/2015   NA 145 10/02/2015   K 4.1 10/02/2015   CL 108 10/02/2015   CREATININE 0.65 10/02/2015   BUN 14 10/02/2015   CO2 27 10/02/2015   TSH 0.90 08/29/2015   HGBA1C 6.4 03/14/2012   MICROALBUR <0.7 08/29/2015    Dg Bone Density  10/15/2015  EXAM: DUAL X-RAY ABSORPTIOMETRY (DXA) FOR BONE MINERAL DENSITY IMPRESSION: Dear Dr. Deborra Petersen, Your patient Sherry Petersen a BMD test on 10/15/2015 using the [place] (analysis version: 14.10) manufactured by [employer]. The following summarizes the results of our evaluation. PATIENT BIOGRAPHICAL: Name: Sherry Petersen Patient ID: [id number] Birth Date: 1960/12/19 Height: 61.0 in. Gender: Female Exam Date: 10/15/2015 Weight: 98.5 lbs. Indications: Caucasian, Hysterectomy, Low Body Weight, Postmenopausal, Tobacco User(Current Smoker) Fractures: Treatments: Vit D ASSESSMENT: The BMD measured at Femur Total Right is 0.627 g/cm2 with a T-score of -3.0. This patient's diagnostic category is considered OSTEOPOROSIS according to [place] [facility]) criteria. Site Region Measured Measured WHO Young Adult BMD Date       Age      Classification T-score AP Spine L1-L4 10/15/2015 56.7 Osteopenia -2.0 0.945 g/cm2 DualFemur Total Right 10/15/2015 56.7 Osteoporosis -3.0 0.627 g/cm2 World Health Organization [facility]) criteria for post-menopausal, Caucasian Women: Normal:       T-score at or above -1 SD Osteopenia:   T-score between -1 and -2.5 SD Osteoporosis: T-score at or below  -2.5 SD RECOMMENDATIONS: [place] recommends that FDA-approved medical therapies be considered in postmenopausal women and men age 24 or older with a: 1. Hip or vertebral (clinical or morphometric) fracture. 2. T-score of < -2.5 at the spine or hip. 3. Ten-year fracture probability by FRAX of 3% or greater for hip fracture or 20% or greater for major osteoporotic fracture. All treatment decisions require clinical judgment and consideration of individual patient factors, including patient preferences, co-morbidities, previous drug use, risk factors not captured in the FRAX model (e.g. falls, vitamin D deficiency, increased bone turnover, interval significant decline in bone density) and possible under - or over-estimation of fracture risk by FRAX. All patients should ensure an adequate intake of dietary calcium (1200 mg/d) and vitamin D (800 IU daily) unless contraindicated. FOLLOW-UP: People with diagnosed cases of osteoporosis or at  high risk for fracture should have regular bone mineral density tests. For patients eligible for Medicare, routine testing is allowed once every 2 years. The testing frequency can be increased to one year for patients who have rapidly progressing disease, those who are receiving or discontinuing medical therapy to restore bone mass, or have additional risk factors. I have reviewed this report, and agree with the above findings. Hassan Rowan, MD [facility] Electronically Signed   By: Margarette Canada M.D.   On: 10/15/2015 14:48    Assessment & Plan:   Problem List Items Addressed This Visit    Arthritis - Primary    Prior evaluation and more Recent ER evaluation did not result in any labs concerning for inflammatory arthritis.  Reassurance provided.   Lab Results  Component Value Date   ESRSEDRATE 4 12/21/2015   Lab Results  Component Value Date   ANA NEG 02/26/2012   Lab Results  Component Value Date   WBC 6.2 12/21/2015   HGB 13.0 12/21/2015     HCT 41.9 08/29/2015   MCV 94.0 08/29/2015   PLT 242 12/21/2015         Relevant Medications   predniSONE (DELTASONE) 10 MG tablet   Rash and nonspecific skin eruption    Facial rash has Now resolved,  Thought to be a lara rash by ED physician,  Now appears to have been a viral exanthem.  Has several papules on chest.  Prednisone taper given.        A total of 25 minutes of face to face time was spent with patient more than half of which was spent in counselling about the above mentioned conditions, reviewing results from recent out of town ER visit,   and coordination of care   I am having Ms. Birdwell start on predniSONE. I am also having her maintain her cholecalciferol, Linaclotide, ondansetron, traMADol, Lactulose, atorvastatin, amLODipine, amitriptyline, and ALPRAZolam.  Meds ordered this encounter  Medications  . predniSONE (DELTASONE) 10 MG tablet    Sig: 6 tablets on Day 1 , then reduce by 1 tablet daily until gone    Dispense:  21 tablet    Refill:  0    There are no discontinued medications.  Follow-up: No Follow-up on file.   Crecencio Mc, MD

## 2015-12-29 ENCOUNTER — Encounter: Payer: Self-pay | Admitting: Internal Medicine

## 2015-12-29 DIAGNOSIS — R21 Rash and other nonspecific skin eruption: Secondary | ICD-10-CM | POA: Insufficient documentation

## 2015-12-29 NOTE — Assessment & Plan Note (Addendum)
Facial rash has Now resolved,  Thought to be a lara rash by ED physician,  Now appears to have been a viral exanthem.  Has several papules on chest.  Prednisone taper given.

## 2015-12-29 NOTE — Assessment & Plan Note (Addendum)
Prior evaluation and more Recent ER evaluation did not result in any labs concerning for inflammatory arthritis.  Reassurance provided.   Lab Results  Component Value Date   ESRSEDRATE 4 12/21/2015   Lab Results  Component Value Date   ANA NEG 02/26/2012   Lab Results  Component Value Date   WBC 6.2 12/21/2015   HGB 13.0 12/21/2015   HCT 41.9 08/29/2015   MCV 94.0 08/29/2015   PLT 242 12/21/2015

## 2016-05-07 ENCOUNTER — Other Ambulatory Visit: Payer: Self-pay | Admitting: Internal Medicine

## 2016-05-28 ENCOUNTER — Other Ambulatory Visit: Payer: Self-pay | Admitting: Internal Medicine

## 2016-06-23 ENCOUNTER — Telehealth: Payer: Self-pay | Admitting: Internal Medicine

## 2016-06-23 NOTE — Telephone Encounter (Signed)
The patient called to see if Juliann Pulse faxed her physical form to her insurance. She stated she gave the paper work to [place] in September 2016 to be filled out.

## 2016-06-23 NOTE — Telephone Encounter (Signed)
Left message for patient to return my call to office.I do not recall but usually we use a phone note to keep track of form and I see no phone note or form in patient chart.

## 2016-06-24 ENCOUNTER — Telehealth: Payer: Self-pay | Admitting: Internal Medicine

## 2016-06-24 NOTE — Telephone Encounter (Signed)
Patient faxing over form to be filled out and faxed today if possible.

## 2016-06-24 NOTE — Telephone Encounter (Signed)
Patient notified form was faxed and placed to chart.

## 2016-06-24 NOTE — Telephone Encounter (Addendum)
Called patient to get waist circumference, patient not in office patient to call back with measurement.

## 2016-06-24 NOTE — Telephone Encounter (Signed)
Pt called back returning your call.   Call pt @ [phone]. Thank you!

## 2016-06-24 NOTE — Telephone Encounter (Signed)
The patient's waist circumference is 26.

## 2016-07-30 ENCOUNTER — Telehealth: Payer: Self-pay | Admitting: Internal Medicine

## 2016-07-30 NOTE — Telephone Encounter (Signed)
Please call and schedule patient an OV with Dr. Derrel Nip. Needs OV for further refills of Xanax. Thank you.

## 2016-07-30 NOTE — Telephone Encounter (Signed)
Last filled 07/04/16. Last OV for anxiety was 10/02/2015.

## 2016-07-30 NOTE — Telephone Encounter (Signed)
Refill for 30 days only.  OFFICE VISIT NEEDED prior to any more refills 

## 2016-08-03 NOTE — Telephone Encounter (Signed)
Called and lm on vm to call office to make an appt to be seen before her next refill.

## 2016-08-04 NOTE — Telephone Encounter (Signed)
Pt states she needs a appt in September. Can you let me know where to schedule pt?   Call pt @ [phone]. Thank you!

## 2016-08-05 NOTE — Telephone Encounter (Signed)
What day can I use for a 11.30 medication follow up next week.

## 2016-08-05 NOTE — Telephone Encounter (Signed)
Try 9/22/@ 2.00 I don't know why that held.

## 2016-08-05 NOTE — Telephone Encounter (Signed)
Tuesday is fine.

## 2016-08-05 NOTE — Telephone Encounter (Signed)
It's held for a pt.

## 2016-08-06 NOTE — Telephone Encounter (Signed)
Next Tuesday at 4.30 please schedule and notify patient.

## 2016-08-06 NOTE — Telephone Encounter (Signed)
Ok Pt is scheduled.  °

## 2016-08-11 ENCOUNTER — Ambulatory Visit (INDEPENDENT_AMBULATORY_CARE_PROVIDER_SITE_OTHER): Payer: 59 | Admitting: Internal Medicine

## 2016-08-11 ENCOUNTER — Encounter: Payer: Self-pay | Admitting: Internal Medicine

## 2016-08-11 VITALS — BP 144/60 | HR 59 | Temp 98.4°F | Resp 12 | Ht 61.0 in | Wt 97.2 lb

## 2016-08-11 DIAGNOSIS — I10 Essential (primary) hypertension: Secondary | ICD-10-CM | POA: Diagnosis not present

## 2016-08-11 DIAGNOSIS — E785 Hyperlipidemia, unspecified: Secondary | ICD-10-CM

## 2016-08-11 DIAGNOSIS — E559 Vitamin D deficiency, unspecified: Secondary | ICD-10-CM

## 2016-08-11 DIAGNOSIS — F5105 Insomnia due to other mental disorder: Secondary | ICD-10-CM

## 2016-08-11 DIAGNOSIS — F419 Anxiety disorder, unspecified: Secondary | ICD-10-CM

## 2016-08-11 DIAGNOSIS — R634 Abnormal weight loss: Secondary | ICD-10-CM

## 2016-08-11 DIAGNOSIS — Z716 Tobacco abuse counseling: Secondary | ICD-10-CM | POA: Diagnosis not present

## 2016-08-11 MED ORDER — ALPRAZOLAM 1 MG PO TABS: 1.0000 mg | ORAL_TABLET | Freq: Every day | ORAL | 5 refills | Status: DC

## 2016-08-11 MED ORDER — TRAZODONE HCL 50 MG PO TABS: 25.0000 mg | ORAL_TABLET | Freq: Every evening | ORAL | 3 refills | Status: DC | PRN

## 2016-08-11 NOTE — Progress Notes (Addendum)
Subjective:  Patient ID: Sherry Petersen, female    DOB: Apr 14, 1961  Age: 55 y.o. MRN: [id number]  CC: The primary encounter diagnosis was Essential hypertension. Diagnoses of Hyperlipemia, Vitamin D deficiency, Hyperlipidemia, Tobacco abuse counseling, Loss of weight, and Insomnia secondary to anxiety were also pertinent to this visit.  HPI Sherry Petersen presents for follow up . On GAD, hypertension and hyperlipidemia.lst seen in January .  Stressed out,  Not sleeping due to increased anxiety .  She has divorced her husband  And lives alone with her 3 dogs. She is facing eviction in a month because her landlord has sold her house; and she has been unable to find  home for her and her 3 dogs.  The elavil makes her feel hung over in the morning. Prefers to use the alprazolam    Taking BP medications and statin . Tolerating medications Still smoking .  Too stressed to consider quitting at this time. .    Still losing weight .  Denies abd pain, night sweats, persistent cough.. .  Still skipping breakfast.    Outpatient Medications Prior to Visit  Medication Sig Dispense Refill  . amLODipine (NORVASC) 10 MG tablet TAKE 1 TABLET(10 MG) BY MOUTH DAILY 90 tablet 2  . atorvastatin (LIPITOR) 20 MG tablet Take 1 tablet (20 mg total) by mouth daily. 90 tablet 3  . cholecalciferol (VITAMIN D) 1000 UNITS tablet Take 5,000 Units by mouth daily.    . Lactulose 20 GM/30ML SOLN 30 ml every 4 hours until constipation is relieved 236 mL 3  . Linaclotide (LINZESS) 145 MCG CAPS capsule Take 1 capsule (145 mcg total) by mouth daily. 30 capsule 11  . ALPRAZolam (XANAX) 1 MG tablet TAKE 1 TABLET BY MOUTH EVERY NIGHT AT BEDTIME AS NEEDED FOR ANXIETY OR SLEEP 30 tablet 0  . amitriptyline (ELAVIL) 100 MG tablet Take 1 tablet (100 mg total) by mouth at bedtime. (Patient not taking: Reported on 08/11/2016) 90 tablet 1  . ondansetron (ZOFRAN) 4 MG tablet Take 1 tablet (4 mg total) by mouth every 8 (eight) hours as needed  for nausea or vomiting. (Patient not taking: Reported on 08/11/2016) 20 tablet 3  . predniSONE (DELTASONE) 10 MG tablet 6 tablets on Day 1 , then reduce by 1 tablet daily until gone (Patient not taking: Reported on 08/11/2016) 21 tablet 0  . traMADol (ULTRAM) 50 MG tablet Take 1 tablet (50 mg total) by mouth every 12 (twelve) hours as needed. (Patient not taking: Reported on 08/11/2016) 60 tablet 2   No facility-administered medications prior to visit.     Review of Systems;  Patient denies headache, fevers, malaise, unintentional weight loss, skin rash, eye pain, sinus congestion and sinus pain, sore throat, dysphagia,  hemoptysis , cough, dyspnea, wheezing, chest pain, palpitations, orthopnea, edema, abdominal pain, nausea, melena, diarrhea, constipation, flank pain, dysuria, hematuria, urinary  Frequency, nocturia, numbness, tingling, seizures,  Focal weakness, Loss of consciousness,  Tremor, insomnia, depression, anxiety, and suicidal ideation.      Objective:  BP (!) 144/60   Pulse (!) 59   Temp 98.4 F (36.9 C) (Oral)   Resp 12   Ht 5' 1"  (1.549 m)   Wt 97 lb 4 oz (44.1 kg)   SpO2 100%   BMI 18.38 kg/m   BP Readings from Last 3 Encounters:  08/11/16 (!) 144/60  12/27/15 128/72  10/02/15 138/84    Wt Readings from Last 3 Encounters:  08/11/16 97 lb 4 oz (44.1 kg)  12/27/15 104 lb 8 oz (47.4 kg)  10/02/15 98 lb 8 oz (44.7 kg)    General appearance: alert, cooperative and appears stated age Ears: normal TM's and external ear canals both ears Throat: lips, mucosa, and tongue normal; teeth and gums normal Neck: no adenopathy, no carotid bruit, supple, symmetrical, trachea midline and thyroid not enlarged, symmetric, no tenderness/mass/nodules Back: symmetric, no curvature. ROM normal. No CVA tenderness. Lungs: clear to auscultation bilaterally Heart: regular rate and rhythm, S1, S2 normal, no murmur, click, rub or gallop Abdomen: soft, non-tender; bowel sounds normal; no  masses,  no organomegaly Pulses: 2+ and symmetric Skin: Skin color, texture, turgor normal. No rashes or lesions Lymph nodes: Cervical, supraclavicular, and axillary nodes normal.  Lab Results  Component Value Date   HGBA1C 6.4 03/14/2012    Lab Results  Component Value Date   CREATININE 0.65 08/11/2016   CREATININE 0.65 10/02/2015   CREATININE 0.69 08/29/2015    Lab Results  Component Value Date   WBC 6.2 12/21/2015   HGB 13.0 12/21/2015   HCT 41.9 08/29/2015   PLT 242 12/21/2015   GLUCOSE 88 08/11/2016   CHOL 178 10/02/2015   TRIG 52 08/11/2016   HDL 77 08/11/2016   LDLDIRECT 73 08/11/2016   LDLCALC 100 (H) 10/02/2015   ALT 9 08/11/2016   AST 17 08/11/2016   NA 142 08/11/2016   K 3.6 08/11/2016   CL 110 08/11/2016   CREATININE 0.65 08/11/2016   BUN 11 08/11/2016   CO2 24 08/11/2016   TSH 0.90 08/29/2015   HGBA1C 6.4 03/14/2012   MICROALBUR <0.7 08/29/2015    Dg Bone Density  Result Date: 10/15/2015 EXAM: DUAL X-RAY ABSORPTIOMETRY (DXA) FOR BONE MINERAL DENSITY IMPRESSION: Dear Dr. Deborra Petersen, Your patient Sherry Petersen completed a BMD test on 10/15/2015 using the [place] (analysis version: 14.10) manufactured by [employer]. The following summarizes the results of our evaluation. PATIENT BIOGRAPHICAL: Name: Sherry, Petersen Patient ID: [id number] Birth Date: [birth year] Height: 61.0 in. Gender: Female Exam Date: 10/15/2015 Weight: 98.5 lbs. Indications: Caucasian, Hysterectomy, Low Body Weight, Postmenopausal, Tobacco User(Current Smoker) Fractures: Treatments: Vit D ASSESSMENT: The BMD measured at Femur Total Right is 0.627 g/cm2 with a T-score of -3.0. This patient's diagnostic category is considered OSTEOPOROSIS according to [place] [facility]) criteria. Site Region Measured Measured WHO Young Adult BMD Date       Age      Classification T-score AP Spine L1-L4 10/15/2015 56.7 Osteopenia -2.0 0.945 g/cm2 DualFemur Total Right 10/15/2015 56.7  Osteoporosis -3.0 0.627 g/cm2 World Health Organization [facility]) criteria for post-menopausal, Caucasian Women: Normal:       T-score at or above -1 SD Osteopenia:   T-score between -1 and -2.5 SD Osteoporosis: T-score at or below -2.5 SD RECOMMENDATIONS: [place] recommends that FDA-approved medical therapies be considered in postmenopausal women and men age 24 or older with a: 1. Hip or vertebral (clinical or morphometric) fracture. 2. T-score of < -2.5 at the spine or hip. 3. Ten-year fracture probability by FRAX of 3% or greater for hip fracture or 20% or greater for major osteoporotic fracture. All treatment decisions require clinical judgment and consideration of individual patient factors, including patient preferences, co-morbidities, previous drug use, risk factors not captured in the FRAX model (e.g. falls, vitamin D deficiency, increased bone turnover, interval significant decline in bone density) and possible under - or over-estimation of fracture risk by FRAX. All patients should ensure an adequate intake of dietary calcium (  1200 mg/d) and vitamin D (800 IU daily) unless contraindicated. FOLLOW-UP: People with diagnosed cases of osteoporosis or at high risk for fracture should have regular bone mineral density tests. For patients eligible for Medicare, routine testing is allowed once every 2 years. The testing frequency can be increased to one year for patients who have rapidly progressing disease, those who are receiving or discontinuing medical therapy to restore bone mass, or have additional risk factors. I have reviewed this report, and agree with the above findings. Hassan Rowan, MD [facility]iology Electronically Signed   By: Margarette Canada M.D.   On: 10/15/2015 14:48    Assessment & Plan:   Problem List Items Addressed This Visit    Loss of weight    She has attributed her weight loss to the multiple emotional stressors, and to skipping breakfast, but is at increased risk  of CA due to tobacco abuse.  CT chest abd pelvis offered.  .       Insomnia secondary to anxiety    Trial of trazodone at bedtime and continue trying to reduce alprazolam dose  .  She has had a recent episode of withdrawal which occurred when she ran out of  alprazolam  Due ot increaseing her dose on several occasions .Marland Kitchen         Relevant Medications   traZODone (DESYREL) 50 MG tablet   ALPRAZolam (XANAX) 1 MG tablet   Tobacco abuse counseling    Spent 3 minutes discussing risk of continued tobacco abuse, including but not limited to CAD, PAD, hypertension, and CA.  She is not interested in pharmacotherapy at this time.      Hypertension - Primary    Well controlled on current regimen. Renal function stable, no changes today.  Lab Results  Component Value Date   CREATININE 0.65 08/11/2016   Lab Results  Component Value Date   NA 142 08/11/2016   K 3.6 08/11/2016   CL 110 08/11/2016   CO2 24 08/11/2016         Relevant Orders   Comprehensive metabolic panel (Completed)   Hyperlipidemia    Well controlled on current statin therapy.   Liver enzymes are normal , no changes today.  Lab Results  Component Value Date   CHOL 178 10/02/2015   HDL 68.00 10/02/2015   LDLCALC 100 (H) 10/02/2015   LDLDIRECT 94.0 10/02/2015   TRIG 52.0 10/02/2015   CHOLHDL 3 10/02/2015   Lab Results  Component Value Date   ALT 9 08/11/2016   AST 17 08/11/2016   ALKPHOS 75 08/11/2016   BILITOT 0.3 08/11/2016         Relevant Orders   Lipid Panel w/Direct LDL:HDL Ratio (Completed)    Other Visit Diagnoses    Hyperlipemia       Vitamin D deficiency       Relevant Orders   VITAMIN D 25 Hydroxy (Vit-D Deficiency, Fractures) (Completed)      I have discontinued Ms. Granade's traMADol and predniSONE. I have also changed her ALPRAZolam. Additionally, I am having her start on traZODone. Lastly, I am having her maintain her cholecalciferol, linaclotide, ondansetron, Lactulose, atorvastatin,  amitriptyline, and amLODipine.  Meds ordered this encounter  Medications  . traZODone (DESYREL) 50 MG tablet    Sig: Take 0.5-1 tablets (25-50 mg total) by mouth at bedtime as needed for sleep.    Dispense:  30 tablet    Refill:  3  . ALPRAZolam (XANAX) 1 MG tablet    Sig:  Take 1 tablet (1 mg total) by mouth at bedtime.    Dispense:  30 tablet    Refill:  5    Refill on or after Sept 17 2017    Medications Discontinued During This Encounter  Medication Reason  . predniSONE (DELTASONE) 10 MG tablet Completed Course  . traMADol (ULTRAM) 50 MG tablet   . ALPRAZolam (XANAX) 1 MG tablet Reorder    Follow-up: No Follow-up on file.   Crecencio Mc, MD

## 2016-08-11 NOTE — Progress Notes (Signed)
Pre-visit discussion using our clinic review tool. No additional management support is needed unless otherwise documented below in the visit note.  

## 2016-08-11 NOTE — Patient Instructions (Addendum)
Trial of trazodone one hour before bedtime to help with insomnia    Do not take more than 1 mg of alprazolam at bedtime  If your anxiety is bothering you during the day, you should consider adding zoloft or lexapro

## 2016-08-12 DIAGNOSIS — E785 Hyperlipidemia, unspecified: Secondary | ICD-10-CM | POA: Insufficient documentation

## 2016-08-12 LAB — COMPREHENSIVE METABOLIC PANEL
ALBUMIN: 4.5 g/dL (ref 3.5–5.2)
ALT: 9 U/L (ref 0–35)
AST: 17 U/L (ref 0–37)
Alkaline Phosphatase: 75 U/L (ref 39–117)
BUN: 11 mg/dL (ref 6–23)
CHLORIDE: 110 meq/L (ref 96–112)
CO2: 24 mEq/L (ref 19–32)
Calcium: 8.9 mg/dL (ref 8.4–10.5)
Creatinine, Ser: 0.65 mg/dL (ref 0.40–1.20)
GFR: 99.64 mL/min (ref >60.00)
GLUCOSE: 88 mg/dL (ref 70–99)
POTASSIUM: 3.6 meq/L (ref 3.5–5.1)
SODIUM: 142 meq/L (ref 135–145)
TOTAL PROTEIN: 6.7 g/dL (ref 6.0–8.3)
Total Bilirubin: 0.3 mg/dL (ref 0.2–1.2)

## 2016-08-12 LAB — VITAMIN D 25 HYDROXY (VIT D DEFICIENCY, FRACTURES): VITD: 33.61 ng/mL (ref 30.00–100.00)

## 2016-08-12 NOTE — Assessment & Plan Note (Signed)
Well controlled on current statin therapy.   Liver enzymes are normal , no changes today.  Lab Results  Component Value Date   CHOL 178 10/02/2015   HDL 68.00 10/02/2015   LDLCALC 100 (H) 10/02/2015   LDLDIRECT 94.0 10/02/2015   TRIG 52.0 10/02/2015   CHOLHDL 3 10/02/2015   Lab Results  Component Value Date   ALT 9 08/11/2016   AST 17 08/11/2016   ALKPHOS 75 08/11/2016   BILITOT 0.3 08/11/2016

## 2016-08-12 NOTE — Assessment & Plan Note (Signed)
Spent 3 minutes discussing risk of continued tobacco abuse, including but not limited to CAD, PAD, hypertension, and CA.  She is not interested in pharmacotherapy at this time. 

## 2016-08-12 NOTE — Assessment & Plan Note (Addendum)
Trial of trazodone at bedtime and continue trying to reduce alprazolam dose  .  She has had a recent episode of withdrawal which occurred when she ran out of  alprazolam  Due ot increaseing her dose on several occasions .Marland Kitchen

## 2016-08-12 NOTE — Assessment & Plan Note (Signed)
Well controlled on current regimen. Renal function stable, no changes today.  Lab Results  Component Value Date   CREATININE 0.65 08/11/2016   Lab Results  Component Value Date   NA 142 08/11/2016   K 3.6 08/11/2016   CL 110 08/11/2016   CO2 24 08/11/2016

## 2016-08-12 NOTE — Assessment & Plan Note (Addendum)
She has attributed her weight loss to the multiple emotional stressors, and to skipping breakfast, but is at increased risk of CA due to tobacco abuse.  CT chest abd pelvis offered.  Marland Kitchen

## 2016-08-13 ENCOUNTER — Other Ambulatory Visit: Payer: Self-pay | Admitting: Internal Medicine

## 2016-08-13 ENCOUNTER — Encounter: Payer: Self-pay | Admitting: Internal Medicine

## 2016-08-13 DIAGNOSIS — Z1239 Encounter for other screening for malignant neoplasm of breast: Secondary | ICD-10-CM

## 2016-08-13 LAB — LIPID PANEL W/DIRECT LDL/HDL RATIO
CHOL/HDL RATIO: 2 ratio (ref <=5.0)
Cholesterol: 156 mg/dL (ref 125–200)
HDL: 77 mg/dL (ref >=46)
LDL DIRECT: 73 mg/dL (ref <130)
LDL:HDL Ratio: 0.9 Ratio
Triglycerides: 52 mg/dL (ref <150)

## 2016-08-13 NOTE — Progress Notes (Signed)
3d am

## 2016-08-27 NOTE — Telephone Encounter (Signed)
Mailed unread message to patient.  

## 2016-09-27 ENCOUNTER — Other Ambulatory Visit: Payer: Self-pay | Admitting: Internal Medicine

## 2016-10-05 ENCOUNTER — Ambulatory Visit (INDEPENDENT_AMBULATORY_CARE_PROVIDER_SITE_OTHER): Payer: 59 | Admitting: Internal Medicine

## 2016-10-05 ENCOUNTER — Encounter: Payer: Self-pay | Admitting: Internal Medicine

## 2016-10-05 ENCOUNTER — Telehealth: Payer: Self-pay | Admitting: *Deleted

## 2016-10-05 VITALS — BP 124/80 | HR 56 | Temp 98.0°F | Resp 12 | Ht 62.0 in | Wt 99.5 lb

## 2016-10-05 DIAGNOSIS — M818 Other osteoporosis without current pathological fracture: Secondary | ICD-10-CM | POA: Diagnosis not present

## 2016-10-05 DIAGNOSIS — Z716 Tobacco abuse counseling: Secondary | ICD-10-CM

## 2016-10-05 DIAGNOSIS — J209 Acute bronchitis, unspecified: Secondary | ICD-10-CM | POA: Insufficient documentation

## 2016-10-05 DIAGNOSIS — Z1239 Encounter for other screening for malignant neoplasm of breast: Secondary | ICD-10-CM

## 2016-10-05 DIAGNOSIS — Z1231 Encounter for screening mammogram for malignant neoplasm of breast: Secondary | ICD-10-CM | POA: Diagnosis not present

## 2016-10-05 DIAGNOSIS — Z0000 Encounter for general adult medical examination without abnormal findings: Secondary | ICD-10-CM

## 2016-10-05 DIAGNOSIS — J206 Acute bronchitis due to rhinovirus: Secondary | ICD-10-CM | POA: Diagnosis not present

## 2016-10-05 DIAGNOSIS — M810 Age-related osteoporosis without current pathological fracture: Secondary | ICD-10-CM | POA: Insufficient documentation

## 2016-10-05 MED ORDER — ALENDRONATE SODIUM 70 MG PO TABS: 70.0000 mg | ORAL_TABLET | ORAL | 11 refills | Status: DC

## 2016-10-05 MED ORDER — ALBUTEROL SULFATE (2.5 MG/3ML) 0.083% IN NEBU
2.5000 mg | INHALATION_SOLUTION | Freq: Once | RESPIRATORY_TRACT | Status: AC
  Administered 2016-10-05: 2.5 mg via RESPIRATORY_TRACT

## 2016-10-05 MED ORDER — PREDNISONE 10 MG PO TABS: ORAL_TABLET | ORAL | 0 refills | Status: DC

## 2016-10-05 MED ORDER — LEVOFLOXACIN 500 MG PO TABS: 500.0000 mg | ORAL_TABLET | Freq: Every day | ORAL | 0 refills | Status: DC

## 2016-10-05 MED ORDER — IPRATROPIUM BROMIDE 0.02 % IN SOLN
0.5000 mg | Freq: Once | RESPIRATORY_TRACT | Status: AC
  Administered 2016-10-05: 0.5 mg via RESPIRATORY_TRACT

## 2016-10-05 MED ORDER — ALBUTEROL SULFATE HFA 108 (90 BASE) MCG/ACT IN AERS: 2.0000 | INHALATION_SPRAY | Freq: Four times a day (QID) | RESPIRATORY_TRACT | 3 refills | Status: DC | PRN

## 2016-10-05 MED ORDER — METHYLPREDNISOLONE ACETATE 40 MG/ML IJ SUSP
40.0000 mg | Freq: Once | INTRAMUSCULAR | Status: AC
  Administered 2016-10-05: 40 mg via INTRAMUSCULAR

## 2016-10-05 NOTE — Telephone Encounter (Signed)
Use 11.30 that Thursday if available.

## 2016-10-05 NOTE — Patient Instructions (Addendum)
You are having aN ACUTE BRONCHITIS WITH WHEEZING    If you become more short of breath than you are now,  You need to go to the nearest ER immediately or call 911  I aM treating you  with the following:  Prednisone tapering dose for the next 6 days   Levaquin 500 mg once daily y with food  7 days (antibiotic) Use your albuterol inhaler as needed Take Delsym for cough    Please take a probiotic ( Align, Floraque or Culturelle), or  the generic version of one of these  For a minimum of 3 weeks to prevent a serious antibiotic associated diarrhea  Called clostridium dificile colitis    Your Bone Density scores have been received, and you have osteoporosis , which places you at higher risk for fracture over the next 10 years.    I recommend treating with alendronate , WEEKLY  AFTER   YOU FINISH THE TREATMENT FOR BRONCHITIS  I would continue to work at getting a minimum  calcium intake of 1200 to 1800 mg daily through diet and supplements ,  And 2000 units of vitamin d daily as well.  weight bearing exercise on a regular basis, has been shown to be helpful as well.   I HAVE ORDERED YOUR MAMMOGRAM AS WELL  Menopause is a normal process in which your reproductive ability comes to an end. This process happens gradually over a span of months to years, usually between the ages of 84 and 30. Menopause is complete when you have missed 12 consecutive menstrual periods. It is important to talk with your health care provider about some of the most common conditions that affect postmenopausal women, such as heart disease, cancer, and bone loss (osteoporosis). Adopting a healthy lifestyle and getting preventive care can help to promote your health and wellness. Those actions can also lower your chances of developing some of these common conditions. WHAT SHOULD I KNOW ABOUT MENOPAUSE? During menopause, you may experience a number of symptoms, such as:  Moderate-to-severe hot flashes.  Night  sweats.  Decrease in sex drive.  Mood swings.  Headaches.  Tiredness.  Irritability.  Memory problems.  Insomnia. Choosing to treat or not to treat menopausal changes is an individual decision that you make with your health care provider. WHAT SHOULD I KNOW ABOUT HORMONE REPLACEMENT THERAPY AND SUPPLEMENTS? Hormone therapy products are effective for treating symptoms that are associated with menopause, such as hot flashes and night sweats. Hormone replacement carries certain risks, especially as you become older. If you are thinking about using estrogen or estrogen with progestin treatments, discuss the benefits and risks with your health care provider. WHAT SHOULD I KNOW ABOUT HEART DISEASE AND STROKE? Heart disease, heart attack, and stroke become more likely as you age. This may be due, in part, to the hormonal changes that your body experiences during menopause. These can affect how your body processes dietary fats, triglycerides, and cholesterol. Heart attack and stroke are both medical emergencies. There are many things that you can do to help prevent heart disease and stroke:  Have your blood pressure checked at least every 1-2 years. High blood pressure causes heart disease and increases the risk of stroke.  If you are 31-10 years old, ask your health care provider if you should take aspirin to prevent a heart attack or a stroke.  Do not use any tobacco products, including cigarettes, chewing tobacco, or electronic cigarettes. If you need help quitting, ask your health care provider.  It is important to eat a healthy diet and maintain a healthy weight.  Be sure to include plenty of vegetables, fruits, low-fat dairy products, and lean protein.  Avoid eating foods that are high in solid fats, added sugars, or salt (sodium).  Get regular exercise. This is one of the most important things that you can do for your health.  Try to exercise for at least 150 minutes each week. The  type of exercise that you do should increase your heart rate and make you sweat. This is known as moderate-intensity exercise.  Try to do strengthening exercises at least twice each week. Do these in addition to the moderate-intensity exercise.  Know your numbers.Ask your health care provider to check your cholesterol and your blood glucose. Continue to have your blood tested as directed by your health care provider. WHAT SHOULD I KNOW ABOUT CANCER SCREENING? There are several types of cancer. Take the following steps to reduce your risk and to catch any cancer development as early as possible. Breast Cancer  Practice breast self-awareness.  This means understanding how your breasts normally appear and feel.  It also means doing regular breast self-exams. Let your health care provider know about any changes, no matter how small.  If you are 19 or older, have a clinician do a breast exam (clinical breast exam or CBE) every year. Depending on your age, family history, and medical history, it may be recommended that you also have a yearly breast X-ray (mammogram).  If you have a family history of breast cancer, talk with your health care provider about genetic screening.  If you are at high risk for breast cancer, talk with your health care provider about having an MRI and a mammogram every year.  Breast cancer (BRCA) gene test is recommended for women who have family members with BRCA-related cancers. Results of the assessment will determine the need for genetic counseling and BRCA1 and for BRCA2 testing. BRCA-related cancers include these types:  Breast. This occurs in males or females.  Ovarian.  Tubal. This may also be called fallopian tube cancer.  Cancer of the abdominal or pelvic lining (peritoneal cancer).  Prostate.  Pancreatic. Cervical, Uterine, and Ovarian Cancer Your health care provider may recommend that you be screened regularly for cancer of the pelvic organs. These  include your ovaries, uterus, and vagina. This screening involves a pelvic exam, which includes checking for microscopic changes to the surface of your cervix (Pap test).  For women ages 21-65, health care providers may recommend a pelvic exam and a Pap test every three years. For women ages 54-65, they may recommend the Pap test and pelvic exam, combined with testing for human papilloma virus (HPV), every five years. Some types of HPV increase your risk of cervical cancer. Testing for HPV may also be done on women of any age who have unclear Pap test results.  Other health care providers may not recommend any screening for nonpregnant women who are considered low risk for pelvic cancer and have no symptoms. Ask your health care provider if a screening pelvic exam is right for you.  If you have had past treatment for cervical cancer or a condition that could lead to cancer, you need Pap tests and screening for cancer for at least 20 years after your treatment. If Pap tests have been discontinued for you, your risk factors (such as having a new sexual partner) need to be reassessed to determine if you should start having screenings again. Some  women have medical problems that increase the chance of getting cervical cancer. In these cases, your health care provider may recommend that you have screening and Pap tests more often.  If you have a family history of uterine cancer or ovarian cancer, talk with your health care provider about genetic screening.  If you have vaginal bleeding after reaching menopause, tell your health care provider.  There are currently no reliable tests available to screen for ovarian cancer. Lung Cancer Lung cancer screening is recommended for adults 14-46 years old who are at high risk for lung cancer because of a history of smoking. A yearly low-dose CT scan of the lungs is recommended if you:  Currently smoke.  Have a history of at least 30 pack-years of smoking and you  currently smoke or have quit within the past 15 years. A pack-year is smoking an average of one pack of cigarettes per day for one year. Yearly screening should:  Continue until it has been 15 years since you quit.  Stop if you develop a health problem that would prevent you from having lung cancer treatment. Colorectal Cancer  This type of cancer can be detected and can often be prevented.  Routine colorectal cancer screening usually begins at age 59 and continues through age 14.  If you have risk factors for colon cancer, your health care provider may recommend that you be screened at an earlier age.  If you have a family history of colorectal cancer, talk with your health care provider about genetic screening.  Your health care provider may also recommend using home test kits to check for hidden blood in your stool.  A small camera at the end of a tube can be used to examine your colon directly (sigmoidoscopy or colonoscopy). This is done to check for the earliest forms of colorectal cancer.  Direct examination of the colon should be repeated every 5-10 years until age 12. However, if early forms of precancerous polyps or small growths are found or if you have a family history or genetic risk for colorectal cancer, you may need to be screened more often. Skin Cancer  Check your skin from head to toe regularly.  Monitor any moles. Be sure to tell your health care provider:  About any new moles or changes in moles, especially if there is a change in a mole's shape or color.  If you have a mole that is larger than the size of a pencil eraser.  If any of your family members has a history of skin cancer, especially at a young age, talk with your health care provider about genetic screening.  Always use sunscreen. Apply sunscreen liberally and repeatedly throughout the day.  Whenever you are outside, protect yourself by wearing long sleeves, pants, a wide-brimmed hat, and  sunglasses. WHAT SHOULD I KNOW ABOUT OSTEOPOROSIS? Osteoporosis is a condition in which bone destruction happens more quickly than new bone creation. After menopause, you may be at an increased risk for osteoporosis. To help prevent osteoporosis or the bone fractures that can happen because of osteoporosis, the following is recommended:  If you are 40-68 years old, get at least 1,000 mg of calcium and at least 600 mg of vitamin D per day.  If you are older than age 92 but younger than age 59, get at least 1,200 mg of calcium and at least 600 mg of vitamin D per day.  If you are older than age 85, get at least 1,200 mg of calcium  and at least 800 mg of vitamin D per day. Smoking and excessive alcohol intake increase the risk of osteoporosis. Eat foods that are rich in calcium and vitamin D, and do weight-bearing exercises several times each week as directed by your health care provider. WHAT SHOULD I KNOW ABOUT HOW MENOPAUSE AFFECTS Sherry Petersen? Depression may occur at any age, but it is more common as you become older. Common symptoms of depression include:  Low or sad mood.  Changes in sleep patterns.  Changes in appetite or eating patterns.  Feeling an overall lack of motivation or enjoyment of activities that you previously enjoyed.  Frequent crying spells. Talk with your health care provider if you think that you are experiencing depression. WHAT SHOULD I KNOW ABOUT IMMUNIZATIONS? It is important that you get and maintain your immunizations. These include:  Tetanus, diphtheria, and pertussis (Tdap) booster vaccine.  Influenza every year before the flu season begins.  Pneumonia vaccine.  Shingles vaccine. Your health care provider may also recommend other immunizations.   This information is not intended to replace advice given to you by your health care provider. Make sure you discuss any questions you have with your health care provider.   Document Released: 01/22/2006  Document Revised: 12/21/2014 Document Reviewed: 08/02/2014 Elsevier Interactive Patient Education [employer].

## 2016-10-05 NOTE — Telephone Encounter (Signed)
Please give a time and date to place pt in 2 week for a follow up on bronchitis and  with Dr.Tullo Pt contact  [phone]

## 2016-10-05 NOTE — Progress Notes (Signed)
Pre-visit discussion using our clinic review tool. No additional management support is needed unless otherwise documented below in the visit note.  

## 2016-10-05 NOTE — Telephone Encounter (Signed)
Pt scheduled  

## 2016-10-05 NOTE — Progress Notes (Signed)
Patient ID: Sherry Petersen, female    DOB: [birth year]  Age: 55 y.o. MRN: [id number]  The patient is here for annual CPE and management of other chronic and acute problems.  mammo ordered but  not done since 2015.  DEXA done in  2016 t score -3.0. No prior treatment,  No recent fractures.  RFs include low BMI and chronic tobacco abuse, possible FH.  .    The risk factors are reflected in the social history.  The roster of all physicians providing medical care to patient - is listed in the Snapshot section of the chart.  Home safety : The patient has smoke detectors in the home. They wear seatbelts.  There are no firearms at home. There is no violence in the home.   There is no risks for hepatitis, STDs or HIV. There is no   history of blood transfusion. They have no travel history to infectious disease endemic areas of the world.  The patient has seen their dentist in the last six month. They have seen their eye doctor in the last year. They admit to slight hearing difficulty with regard to whispered voices and some television programs.  They have deferred audiologic testing in the last year.  They do not  have excessive sun exposure. Discussed the need for sun protection: hats, long sleeves and use of sunscreen if there is significant sun exposure.   Diet: the importance of a healthy diet is discussed. They do not have a healthy diet.  The benefits of regular aerobic exercise were discussed. She does not exercises and is still smoking .   Depression screen: there are no signs or vegative symptoms of depression- irritability, change in appetite, anhedonia, sadness/tearfullness.  The following portions of the patient's history were reviewed and updated as appropriate: allergies, current medications, past family history, past medical history,  past surgical history, past social history  and problem list.  Visual acuity was not assessed per patient preference since she has regular follow up with  her ophthalmologist. Hearing and body mass index were assessed and reviewed.   During the course of the visit the patient was educated and counseled about appropriate screening and preventive services including : fall prevention , diabetes screening, nutrition counseling, colorectal cancer screening, and recommended immunizations.    CC: The primary encounter diagnosis was Breast cancer screening. Diagnoses of Other osteoporosis without current pathological fracture, Acute bronchitis due to Rhinovirus, Encounter for preventive health examination, and Tobacco abuse counseling were also pertinent to this visit.  URI started last week with sore throat,  Then sneezing ,  runny nose,  Now settled in her severe non .  cough Productive cough at tomes not daily taking Mucinex for cold and flu which is treating the cough  Better than Nyquil cold and flu  Cough is aggravated by supine position.  Has suspend tobacco  While sick. Cessation advised.  Osteoporosis:  Recommended alendronate , smoking cessation,  Calcium and vitamin D  Weight loss:  Stress and anxiety  ausing her weight to fluctuate,. Has moved back in with ex husband Sherry Petersen after losing her rented house due to sale.   Husband Sherry Petersen is having hip replacement in Nov so she will stay with him throuht the rehab.   Feels she is sleeping better since moving in with him.  Open to the possibility of reconciliation    History Sherry Petersen has a past medical history of Anxiety and Hematuria.   She has a past surgical history  that includes Carpal tunnel release (2003); Vaginal hysterectomy (2002); and Tonsillectomy (2012).   Her family history includes Diabetes in her mother; Early death in her father; Heart attack in her brother, father, and sister; Heart disease in her father; Mental illness in her mother.She reports that she has been smoking Cigarettes.  She has a 7.50 pack-year smoking history. She has never used smokeless tobacco. She reports that she does not  drink alcohol or use drugs.  Outpatient Medications Prior to Visit  Medication Sig Dispense Refill  . ALPRAZolam (XANAX) 1 MG tablet Take 1 tablet (1 mg total) by mouth at bedtime. 30 tablet 5  . amLODipine (NORVASC) 10 MG tablet TAKE 1 TABLET(10 MG) BY MOUTH DAILY 90 tablet 2  . atorvastatin (LIPITOR) 20 MG tablet TAKE 1 TABLET(20 MG) BY MOUTH DAILY 90 tablet 1  . cholecalciferol (VITAMIN D) 1000 UNITS tablet Take 5,000 Units by mouth daily.    . Lactulose 20 GM/30ML SOLN 30 ml every 4 hours until constipation is relieved 236 mL 3  . Linaclotide (LINZESS) 145 MCG CAPS capsule Take 1 capsule (145 mcg total) by mouth daily. 30 capsule 11  . ondansetron (ZOFRAN) 4 MG tablet Take 1 tablet (4 mg total) by mouth every 8 (eight) hours as needed for nausea or vomiting. 20 tablet 3  . traZODone (DESYREL) 50 MG tablet Take 0.5-1 tablets (25-50 mg total) by mouth at bedtime as needed for sleep. 30 tablet 3  . amitriptyline (ELAVIL) 100 MG tablet Take 1 tablet (100 mg total) by mouth at bedtime. (Patient not taking: Reported on 10/05/2016) 90 tablet 1   No facility-administered medications prior to visit.     Review of Systems   Patient denies headache, fevers, malaise, unintentional weight loss, skin rash, eye pain,  dysphagia,  hemoptysis , , chest pain, palpitations, orthopnea, edema, abdominal pain, nausea, melena, diarrhea, constipation, flank pain, dysuria, hematuria, urinary  Frequency, nocturia, numbness, tingling, seizures,  Focal weakness, Loss of consciousness,  Tremor, , depression,  and suicidal ideation.      Objective:  BP 124/80   Pulse (!) 56   Temp 98 F (36.7 C) (Oral)   Resp 12   Ht 5\' 2"  (1.575 m)   Wt 99 lb 8 oz (45.1 kg)   SpO2 96%   BMI 18.20 kg/m   Physical Exam   General appearance: alert, cooperative and appears stated age Head: Normocephalic, without obvious abnormality, atraumatic Eyes: conjunctivae/corneas clear. PERRL, EOM's intact. Fundi benign. Ears:  normal TM's and external ear canals both ears Nose: Nares normal. Septum midline. Mucosa normal. No drainage or sinus tenderness. Throat: lips, mucosa, and tongue normal; teeth and gums normal Neck: no adenopathy, no carotid bruit, no JVD, supple, symmetrical, trachea midline and thyroid not enlarged, symmetric, no tenderness/mass/nodules Lungs: decreased A/M  Bilaterally, wheezing in al lung fields .  No egophony Breasts: normal appearance, no masses or tenderness Heart: regular rate and rhythm, S1, S2 normal, no murmur, click, rub or gallop Abdomen: soft, non-tender; bowel sounds normal; no masses,  no organomegaly Extremities: extremities normal, atraumatic, no cyanosis or edema Pulses: 2+ and symmetric Skin: Skin color, texture, turgor normal. No rashes or lesions Neurologic: Alert and oriented X 3, normal strength and tone. Normal symmetric reflexes. Normal coordination and gait.     Assessment & Plan:   Problem List Items Addressed This Visit    Encounter for preventive health examination    Annual comprehensive preventive exam was done as well as an evaluation and management  of chronic conditions .  During the course of the visit the patient was educated and counseled about appropriate screening and preventive services including :  diabetes screening, lipid analysis with projected  10 year  risk for CAD , nutrition counseling, breast, cervical and colorectal cancer screening, and recommended immunizations.  Printed recommendations for health maintenance screenings was given.  Lab Results  Component Value Date   CHOL 178 10/02/2015   HDL 77 08/11/2016   LDLCALC 100 (H) 10/02/2015   LDLDIRECT 73 08/11/2016   TRIG 52 08/11/2016   CHOLHDL 2.0 08/11/2016   Lab Results  Component Value Date   CREATININE 0.65 08/11/2016   Lab Results  Component Value Date   NA 142 08/11/2016   K 3.6 08/11/2016   CL 110 08/11/2016   CO2 24 08/11/2016         Tobacco abuse counseling     Spent 3 minutes discussing risk of continued tobacco abuse, including but not limited to CAD, PAD, hypertension, and CA.  She is not interested in pharmacotherapy at this time.      Osteoporosis    Therapy recommended with weekly alendronate. Calcium and Vit D requirements discussed as well.       Relevant Medications   alendronate (FOSAMAX) 70 MG tablet   Acute bronchitis    .Current presentation suggest viral etiology. However she has unconfirmed COPD.  Will treat supportively,  Advised to start the antibiotic I have given  only if if symptoms fail to resolve in a few days or gprogress to include fevers, chest , facial or ear pain, or purulent/blood streaked sputum or nasal discharge.  Nebulized albuterol and IM Depo Medrol were given in office.       Relevant Medications   methylPREDNISolone acetate (DEPO-MEDROL) injection 40 mg (Completed)   albuterol (PROVENTIL) (2.5 MG/3ML) 0.083% nebulizer solution 2.5 mg (Completed)   ipratropium (ATROVENT) nebulizer solution 0.5 mg (Completed)    Other Visit Diagnoses    Breast cancer screening    -  Primary   Relevant Orders   MM SCREENING BREAST TOMO BILATERAL      I have discontinued Ms. Hlavac's amitriptyline. I am also having her start on predniSONE, levofloxacin, albuterol, and alendronate. Additionally, I am having her maintain her cholecalciferol, linaclotide, ondansetron, Lactulose, amLODipine, traZODone, ALPRAZolam, and atorvastatin. We administered methylPREDNISolone acetate, albuterol, and ipratropium.  Meds ordered this encounter  Medications  . predniSONE (DELTASONE) 10 MG tablet    Sig: 6 tablets on Day 1 , then reduce by 1 tablet daily until gone    Dispense:  21 tablet    Refill:  0  . levofloxacin (LEVAQUIN) 500 MG tablet    Sig: Take 1 tablet (500 mg total) by mouth daily.    Dispense:  7 tablet    Refill:  0  . albuterol (PROVENTIL HFA;VENTOLIN HFA) 108 (90 Base) MCG/ACT inhaler    Sig: Inhale 2 puffs into the lungs  every 6 (six) hours as needed for wheezing or shortness of breath.    Dispense:  6.7 g    Refill:  3  . alendronate (FOSAMAX) 70 MG tablet    Sig: Take 1 tablet (70 mg total) by mouth every 7 (seven) days. Take with a full glass of water on an empty stomach.    Dispense:  4 tablet    Refill:  11  . methylPREDNISolone acetate (DEPO-MEDROL) injection 40 mg  . albuterol (PROVENTIL) (2.5 MG/3ML) 0.083% nebulizer solution 2.5 mg  . ipratropium (ATROVENT) nebulizer  solution 0.5 mg    Medications Discontinued During This Encounter  Medication Reason  . amitriptyline (ELAVIL) 100 MG tablet Patient Preference    Follow-up: Return in about 2 weeks (around 10/19/2016) for flu shot,  follow up on bronchitis .   Crecencio Mc, MD

## 2016-10-06 NOTE — Assessment & Plan Note (Addendum)
.  Current presentation suggest viral etiology. However she has unconfirmed COPD.  Will treat supportively,  Advised to start the antibiotic I have given  only if if symptoms fail to resolve in a few days or gprogress to include fevers, chest , facial or ear pain, or purulent/blood streaked sputum or nasal discharge.  Nebulized albuterol and IM Depo Medrol were given in office.

## 2016-10-06 NOTE — Assessment & Plan Note (Signed)
Spent 3 minutes discussing risk of continued tobacco abuse, including but not limited to CAD, PAD, hypertension, and CA.  She is not interested in pharmacotherapy at this time. 

## 2016-10-06 NOTE — Assessment & Plan Note (Addendum)
Annual comprehensive preventive exam was done as well as an evaluation and management of chronic conditions .  During the course of the visit the patient was educated and counseled about appropriate screening and preventive services including :  diabetes screening, lipid analysis with projected  10 year  risk for CAD , nutrition counseling, breast, cervical and colorectal cancer screening, and recommended immunizations.  Printed recommendations for health maintenance screenings was given.  Lab Results  Component Value Date   CHOL 178 10/02/2015   HDL 77 08/11/2016   LDLCALC 100 (H) 10/02/2015   LDLDIRECT 73 08/11/2016   TRIG 52 08/11/2016   CHOLHDL 2.0 08/11/2016   Lab Results  Component Value Date   CREATININE 0.65 08/11/2016   Lab Results  Component Value Date   NA 142 08/11/2016   K 3.6 08/11/2016   CL 110 08/11/2016   CO2 24 08/11/2016

## 2016-10-06 NOTE — Assessment & Plan Note (Signed)
Therapy recommended with weekly alendronate. Calcium and Vit D requirements discussed as well.

## 2016-10-08 ENCOUNTER — Ambulatory Visit: Payer: 59 | Admitting: Internal Medicine

## 2016-10-19 ENCOUNTER — Ambulatory Visit (INDEPENDENT_AMBULATORY_CARE_PROVIDER_SITE_OTHER): Payer: 59 | Admitting: Internal Medicine

## 2016-10-19 ENCOUNTER — Encounter: Payer: Self-pay | Admitting: Internal Medicine

## 2016-10-19 DIAGNOSIS — M818 Other osteoporosis without current pathological fracture: Secondary | ICD-10-CM

## 2016-10-19 DIAGNOSIS — J206 Acute bronchitis due to rhinovirus: Secondary | ICD-10-CM | POA: Diagnosis not present

## 2016-10-19 DIAGNOSIS — Z716 Tobacco abuse counseling: Secondary | ICD-10-CM | POA: Diagnosis not present

## 2016-10-19 NOTE — Progress Notes (Signed)
Pre-visit discussion using our clinic review tool. No additional management support is needed unless otherwise documented below in the visit note.  

## 2016-10-19 NOTE — Patient Instructions (Signed)
   I'm glad you are feeling better!  You can start the alendronate now for your osteoporosis  During the winter months you should take 2,000 IUs of Vitamin D3  Daily and try to get 1200 to 1800 mg of calcium daily through diet and supplements

## 2016-10-19 NOTE — Progress Notes (Signed)
Subjective:  Patient ID: Sherry Petersen, female    DOB: December 13, 1961  Age: 55 y.o. MRN: [id number]  CC: Diagnoses of Other osteoporosis without current pathological fracture, Tobacco abuse counseling, and Acute bronchitis due to Rhinovirus were pertinent to this visit.  HPI Sherry Petersen presents for follow up on several issues:  1) acute bronchitis.  She was treated on Oct 23rd  With levaquin and prednisone and urged to quit smoking . She has improved clinically and Has resumed  Smoking but reports that she has reduced her use to 1/2 pack or less daily.   2) Osteoporisis: Results of DEXA reviewed.  Medication therapy discussed.  She has no c/i to bisphosphonates.      Outpatient Medications Prior to Visit  Medication Sig Dispense Refill  . albuterol (PROVENTIL HFA;VENTOLIN HFA) 108 (90 Base) MCG/ACT inhaler Inhale 2 puffs into the lungs every 6 (six) hours as needed for wheezing or shortness of breath. 6.7 g 3  . alendronate (FOSAMAX) 70 MG tablet Take 1 tablet (70 mg total) by mouth every 7 (seven) days. Take with a full glass of water on an empty stomach. 4 tablet 11  . ALPRAZolam (XANAX) 1 MG tablet Take 1 tablet (1 mg total) by mouth at bedtime. 30 tablet 5  . amLODipine (NORVASC) 10 MG tablet TAKE 1 TABLET(10 MG) BY MOUTH DAILY 90 tablet 2  . atorvastatin (LIPITOR) 20 MG tablet TAKE 1 TABLET(20 MG) BY MOUTH DAILY 90 tablet 1  . cholecalciferol (VITAMIN D) 1000 UNITS tablet Take 5,000 Units by mouth daily.    . Lactulose 20 GM/30ML SOLN 30 ml every 4 hours until constipation is relieved 236 mL 3  . levofloxacin (LEVAQUIN) 500 MG tablet Take 1 tablet (500 mg total) by mouth daily. 7 tablet 0  . Linaclotide (LINZESS) 145 MCG CAPS capsule Take 1 capsule (145 mcg total) by mouth daily. 30 capsule 11  . ondansetron (ZOFRAN) 4 MG tablet Take 1 tablet (4 mg total) by mouth every 8 (eight) hours as needed for nausea or vomiting. 20 tablet 3  . traZODone (DESYREL) 50 MG tablet Take 0.5-1  tablets (25-50 mg total) by mouth at bedtime as needed for sleep. 30 tablet 3  . predniSONE (DELTASONE) 10 MG tablet 6 tablets on Day 1 , then reduce by 1 tablet daily until gone (Patient not taking: Reported on 10/19/2016) 21 tablet 0   No facility-administered medications prior to visit.     Review of Systems;  Patient denies headache, fevers, malaise, unintentional weight loss, skin rash, eye pain, sinus congestion and sinus pain, sore throat, dysphagia,  hemoptysis , cough, dyspnea, wheezing, chest pain, palpitations, orthopnea, edema, abdominal pain, nausea, melena, diarrhea, constipation, flank pain, dysuria, hematuria, urinary  Frequency, nocturia, numbness, tingling, seizures,  Focal weakness, Loss of consciousness,  Tremor, insomnia, depression, anxiety, and suicidal ideation.      Objective:  BP 100/72   Pulse 60   Temp 98.3 F (36.8 C) (Oral)   Resp 12   Ht 5\' 2"  (1.575 m)   Wt 97 lb 8 oz (44.2 kg)   SpO2 97%   BMI 17.83 kg/m   BP Readings from Last 3 Encounters:  10/19/16 100/72  10/05/16 124/80  08/11/16 (!) 144/60    Wt Readings from Last 3 Encounters:  10/19/16 97 lb 8 oz (44.2 kg)  10/05/16 99 lb 8 oz (45.1 kg)  08/11/16 97 lb 4 oz (44.1 kg)    General appearance: alert, cooperative and appears stated age  Ears: normal TM's and external ear canals both ears Throat: lips, mucosa, and tongue normal; teeth and gums normal Neck: no adenopathy, no carotid bruit, supple, symmetrical, trachea midline and thyroid not enlarged, symmetric, no tenderness/mass/nodules Back: symmetric, no curvature. ROM normal. No CVA tenderness. Lungs: clear to auscultation bilaterally Heart: regular rate and rhythm, S1, S2 normal, no murmur, click, rub or gallop Abdomen: soft, non-tender; bowel sounds normal; no masses,  no organomegaly Pulses: 2+ and symmetric Skin: Skin color, texture, turgor normal. No rashes or lesions Lymph nodes: Cervical, supraclavicular, and axillary nodes  normal.  Lab Results  Component Value Date   HGBA1C 6.4 03/14/2012    Lab Results  Component Value Date   CREATININE 0.65 08/11/2016   CREATININE 0.65 10/02/2015   CREATININE 0.69 08/29/2015    Lab Results  Component Value Date   WBC 6.2 12/21/2015   HGB 13.0 12/21/2015   HCT 41.9 08/29/2015   PLT 242 12/21/2015   GLUCOSE 88 08/11/2016   CHOL 178 10/02/2015   TRIG 52 08/11/2016   HDL 77 08/11/2016   LDLDIRECT 73 08/11/2016   LDLCALC 100 (H) 10/02/2015   ALT 9 08/11/2016   AST 17 08/11/2016   NA 142 08/11/2016   K 3.6 08/11/2016   CL 110 08/11/2016   CREATININE 0.65 08/11/2016   BUN 11 08/11/2016   CO2 24 08/11/2016   TSH 0.90 08/29/2015   HGBA1C 6.4 03/14/2012   MICROALBUR <0.7 08/29/2015    Dg Bone Density  Result Date: 10/15/2015 EXAM: DUAL X-RAY ABSORPTIOMETRY (DXA) FOR BONE MINERAL DENSITY IMPRESSION: Dear Dr. Deborra Medina, Your patient Sherry Petersen completed a BMD test on 10/15/2015 using the [place] (analysis version: 14.10) manufactured by [employer]. The following summarizes the results of our evaluation. PATIENT BIOGRAPHICAL: Name: Sherry Petersen, Sherry Petersen Patient ID: [id number] Birth Date: August 09, 1961 Height: 61.0 in. Gender: Female Exam Date: 10/15/2015 Weight: 98.5 lbs. Indications: Caucasian, Hysterectomy, Low Body Weight, Postmenopausal, Tobacco User(Current Smoker) Fractures: Treatments: Vit D ASSESSMENT: The BMD measured at Femur Total Right is 0.627 g/cm2 with a T-score of -3.0. This patient's diagnostic category is considered OSTEOPOROSIS according to [facility]) criteria. Site Region Measured Measured WHO Young Adult BMD Date       Age      Classification T-score AP Spine L1-L4 10/15/2015 56.7 Osteopenia -2.0 0.945 g/cm2 DualFemur Total Right 10/15/2015 56.7 Osteoporosis -3.0 0.627 g/cm2 World Health Organization [facility]) criteria for post-menopausal, Caucasian Women: Normal:       T-score at or above -1 SD Osteopenia:   T-score  between -1 and -2.5 SD Osteoporosis: T-score at or below -2.5 SD RECOMMENDATIONS: [place] recommends that FDA-approved medical therapies be considered in postmenopausal women and men age 22 or older with a: 1. Hip or vertebral (clinical or morphometric) fracture. 2. T-score of < -2.5 at the spine or hip. 3. Ten-year fracture probability by FRAX of 3% or greater for hip fracture or 20% or greater for major osteoporotic fracture. All treatment decisions require clinical judgment and consideration of individual patient factors, including patient preferences, co-morbidities, previous drug use, risk factors not captured in the FRAX model (e.g. falls, vitamin D deficiency, increased bone turnover, interval significant decline in bone density) and possible under - or over-estimation of fracture risk by FRAX. All patients should ensure an adequate intake of dietary calcium (1200 mg/d) and vitamin D (800 IU daily) unless contraindicated. FOLLOW-UP: People with diagnosed cases of osteoporosis or at high risk for fracture should have regular  bone mineral density tests. For patients eligible for Medicare, routine testing is allowed once every 2 years. The testing frequency can be increased to one year for patients who have rapidly progressing disease, those who are receiving or discontinuing medical therapy to restore bone mass, or have additional risk factors. I have reviewed this report, and agree with the above findings. Hassan Rowan, MD [facility] Electronically Signed   By: Margarette Canada M.D.   On: 10/15/2015 14:48    Assessment & Plan:   Problem List Items Addressed This Visit    Tobacco abuse counseling    Spent 3 minutes discussing risk of continued tobacco abuse, including but not limited to CAD, PAD, hypertension, and CA.  He is not interested in pharmacotherapy at this time.      Osteoporosis    Therapy recommended with weekly alendronate. Calcium and Vit D requirements  discussed as well.       Acute bronchitis    Lung exam is normal and her cough has resolved.         A total of 25 minutes of face to face time was spent with patient more than half of which was spent in counselling about the above mentioned conditions  and coordination of care  I am having Ms. Tudisco maintain her cholecalciferol, linaclotide, ondansetron, Lactulose, amLODipine, traZODone, ALPRAZolam, atorvastatin, predniSONE, levofloxacin, albuterol, and alendronate.  No orders of the defined types were placed in this encounter.   There are no discontinued medications.  Follow-up: Return in about 6 months (around 04/18/2017).   Crecencio Mc, MD

## 2016-10-20 NOTE — Assessment & Plan Note (Signed)
Spent 3 minutes discussing risk of continued tobacco abuse, including but not limited to CAD, PAD, hypertension, and CA.  He is not interested in pharmacotherapy at this time. 

## 2016-10-20 NOTE — Assessment & Plan Note (Signed)
Therapy recommended with weekly alendronate. Calcium and Vit D requirements discussed as well.

## 2016-10-20 NOTE — Assessment & Plan Note (Signed)
Lung exam is normal and her cough has resolved.

## 2017-02-16 ENCOUNTER — Other Ambulatory Visit: Payer: Self-pay | Admitting: Internal Medicine

## 2017-02-16 NOTE — Telephone Encounter (Signed)
Refilled on 08/11/2016 Last Ov: 10/19/2016 Next Ov: not scheduled.   Please advise.

## 2017-02-17 NOTE — Telephone Encounter (Signed)
Signed rx faxed to pharmacy

## 2017-06-07 ENCOUNTER — Telehealth: Payer: Self-pay | Admitting: Internal Medicine

## 2017-06-07 NOTE — Telephone Encounter (Signed)
Last fill 02/17/17 #30 with 3 rf  Last o/v 10/19/16 F/u no app made.

## 2017-06-09 NOTE — Telephone Encounter (Signed)
Refill for 30 days only.  OFFICE VISIT NEEDED prior to any more refills 

## 2017-06-09 NOTE — Telephone Encounter (Signed)
LMTCB. Need to schedule pt a follow up appt.   Rx has been printed, signed and faxed.

## 2017-06-09 NOTE — Telephone Encounter (Signed)
Patient has been scheduled for a follow up

## 2017-07-05 ENCOUNTER — Other Ambulatory Visit: Payer: Self-pay | Admitting: Internal Medicine

## 2017-07-14 ENCOUNTER — Other Ambulatory Visit: Payer: Self-pay | Admitting: Internal Medicine

## 2017-07-14 NOTE — Telephone Encounter (Signed)
30 days only.

## 2017-07-14 NOTE — Telephone Encounter (Signed)
Patient has upcoming appointment on 07/28/17. Ok to refill?

## 2017-07-28 ENCOUNTER — Encounter: Payer: Self-pay | Admitting: Internal Medicine

## 2017-07-28 ENCOUNTER — Ambulatory Visit (INDEPENDENT_AMBULATORY_CARE_PROVIDER_SITE_OTHER): Payer: 59 | Admitting: Internal Medicine

## 2017-07-28 VITALS — BP 130/78 | HR 58 | Temp 98.4°F | Resp 16 | Wt 97.4 lb

## 2017-07-28 DIAGNOSIS — Z716 Tobacco abuse counseling: Secondary | ICD-10-CM | POA: Diagnosis not present

## 2017-07-28 DIAGNOSIS — E538 Deficiency of other specified B group vitamins: Secondary | ICD-10-CM

## 2017-07-28 DIAGNOSIS — E7800 Pure hypercholesterolemia, unspecified: Secondary | ICD-10-CM | POA: Diagnosis not present

## 2017-07-28 DIAGNOSIS — F411 Generalized anxiety disorder: Secondary | ICD-10-CM

## 2017-07-28 DIAGNOSIS — Z1231 Encounter for screening mammogram for malignant neoplasm of breast: Secondary | ICD-10-CM

## 2017-07-28 DIAGNOSIS — Z1239 Encounter for other screening for malignant neoplasm of breast: Secondary | ICD-10-CM

## 2017-07-28 DIAGNOSIS — R634 Abnormal weight loss: Secondary | ICD-10-CM

## 2017-07-28 DIAGNOSIS — I10 Essential (primary) hypertension: Secondary | ICD-10-CM

## 2017-07-28 DIAGNOSIS — M818 Other osteoporosis without current pathological fracture: Secondary | ICD-10-CM

## 2017-07-28 LAB — TSH: TSH: 0.77 u[IU]/mL (ref 0.35–4.50)

## 2017-07-28 LAB — VITAMIN B12: VITAMIN B 12: 320 pg/mL (ref 211–911)

## 2017-07-28 LAB — COMPREHENSIVE METABOLIC PANEL
ALBUMIN: 4.5 g/dL (ref 3.5–5.2)
ALK PHOS: 82 U/L (ref 39–117)
ALT: 9 U/L (ref 0–35)
AST: 16 U/L (ref 0–37)
BUN: 16 mg/dL (ref 6–23)
CALCIUM: 9.3 mg/dL (ref 8.4–10.5)
CHLORIDE: 110 meq/L (ref 96–112)
CO2: 26 mEq/L (ref 19–32)
CREATININE: 0.75 mg/dL (ref 0.40–1.20)
GFR: 84.19 mL/min (ref >60.00)
Glucose, Bld: 89 mg/dL (ref 70–99)
POTASSIUM: 4.1 meq/L (ref 3.5–5.1)
Sodium: 144 mEq/L (ref 135–145)
Total Bilirubin: 0.2 mg/dL (ref 0.2–1.2)
Total Protein: 6.4 g/dL (ref 6.0–8.3)

## 2017-07-28 LAB — LIPID PANEL
CHOLESTEROL: 177 mg/dL (ref 0–200)
HDL: 56.3 mg/dL (ref >39.00)
LDL CALC: 106 mg/dL — AB (ref 0–99)
NonHDL: 120.53
TRIGLYCERIDES: 75 mg/dL (ref 0.0–149.0)
Total CHOL/HDL Ratio: 3
VLDL: 15 mg/dL (ref 0.0–40.0)

## 2017-07-28 MED ORDER — LINACLOTIDE 72 MCG PO CAPS: 72.0000 ug | ORAL_CAPSULE | Freq: Every day | ORAL | 0 refills | Status: DC

## 2017-07-28 MED ORDER — ESCITALOPRAM OXALATE 10 MG PO TABS: 10.0000 mg | ORAL_TABLET | Freq: Every day | ORAL | 2 refills | Status: DC

## 2017-07-28 NOTE — Patient Instructions (Addendum)
Stop skipping breakfast!  You need protein in the morning  You might want to try a premixed protein drink called Premier Protein shake for breakfast or late night snack . It is great tasting,   very low sugar and available of < $2 serving at [facility] and  In bulk for $1.50/serving at [employer] and [employer]  .    Nutritional analysis :  160 cal  30 g protein  1 g sugar 50% calcium needs   Vladimir Faster and [employer]  For your anxiety  Please start the Lexapro (escitalopram) at 1/2 tablet daily in the evening for the first few days to avoid nausea.  You can increase to a full tablet after 4 days if you havenot developed side effects of nausea.  If the lexapro interferes with your sleep, take it in the morning instead   You can continue using the alprazolam ofr insomnia if needed

## 2017-07-28 NOTE — Progress Notes (Signed)
Subjective:  Patient ID: Sherry Petersen, female    DOB: 03/31/1961  Age: 56 y.o. MRN: [id number]  CC: The primary encounter diagnosis was Screening breast examination. Diagnoses of B12 deficiency, Loss of weight, Essential hypertension, Pure hypercholesterolemia, Tobacco abuse counseling, Other osteoporosis without current pathological fracture, and Generalized anxiety disorder were also pertinent to this visit.  HPI [name] presents for follow up on hypertension, hyperlipidemia,  GAD with insomnia,  And  osteoporosis, several week ago with underweight ,  ongoing  tobacco abuse   Last seen Nov 2017 . Has run ou tof her medications several week ago  Still smoking  1/2 pack daily . Not trying to reduce.  Used to smoke 2 packs daily   Right eye pain comes and goes  For the past several months .  Marland Kitchen Also notes that her upper eye lid and lower eye lids peel every 3 or 4 months,   both eyes. Wears contacts during the day   Not sleeping well.  initiiation  sometimes a problem, always a problem maintaining,  Wakes up worrying .  Prior trials of zoloft and prozac not tolerated . Ran out of alprazolam in July due to being lost to follow up had some withdrawal symptoms.  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    Outpatient Medications Prior to Visit  Medication Sig Dispense Refill  . albuterol (PROVENTIL HFA;VENTOLIN HFA) 108 (90 Base) MCG/ACT inhaler Inhale 2 puffs into the lungs every 6 (six) hours as needed for wheezing or shortness of breath. 6.7 g 3  . ALPRAZolam (XANAX) 1 MG tablet TAKE 1 TABLET BY MOUTH EVERY NIGHT AT BEDTIME 30 tablet 0  . amLODipine (NORVASC) 10 MG tablet TAKE 1 TABLET(10 MG) BY MOUTH DAILY 90 tablet 2  . atorvastatin (LIPITOR) 20 MG tablet TAKE 1 TABLET(20 MG) BY MOUTH DAILY 90 tablet 1  . cholecalciferol (VITAMIN D) 1000 UNITS  tablet Take 5,000 Units by mouth daily.    . ondansetron (ZOFRAN) 4 MG tablet Take 1 tablet (4 mg total) by mouth every 8 (eight) hours as needed for nausea or vomiting. 20 tablet 3  . Linaclotide (LINZESS) 145 MCG CAPS capsule Take 1 capsule (145 mcg total) by mouth daily. 30 capsule 11  . alendronate (FOSAMAX) 70 MG tablet Take 1 tablet (70 mg total) by mouth every 7 (seven) days. Take with a full glass of water on an empty stomach. (Patient not taking: Reported on 07/28/2017) 4 tablet 11  . Lactulose 20 GM/30ML SOLN 30 ml every 4 hours until constipation is relieved (Patient not taking: Reported on 07/28/2017) 236 mL 3  . levofloxacin (LEVAQUIN) 500 MG tablet Take 1 tablet (500 mg total) by mouth daily. (Patient not taking: Reported on 07/28/2017) 7 tablet 0  . predniSONE (DELTASONE) 10 MG tablet 6 tablets on Day 1 , then reduce by 1 tablet daily until gone (Patient not taking: Reported on 10/19/2016) 21 tablet 0  . traZODone (DESYREL) 50 MG tablet Take 0.5-1 tablets (25-50 mg total) by mouth at bedtime as needed for sleep. (Patient not taking: Reported on 07/28/2017) 30 tablet 3   No facility-administered medications prior to visit.     Review of Systems;  Patient denies headache, fevers, malaise, unintentional weight loss, skin rash, eye pain, sinus congestion and sinus  pain, sore throat, dysphagia,  hemoptysis , cough, dyspnea, wheezing, chest pain, palpitations, orthopnea, edema, abdominal pain, nausea, melena, diarrhea, constipation, flank pain, dysuria, hematuria, urinary  Frequency, nocturia, numbness, tingling, seizures,  Focal weakness, Loss of consciousness,  Tremor, insomnia, depression, anxiety, and suicidal ideation.      Objective:  BP 130/78   Pulse (!) 58   Temp 98.4 F (36.9 C) (Oral)   Resp 16   Wt 97 lb 6.4 oz (44.2 kg)   SpO2 92%   BMI 17.81 kg/m   BP Readings from Last 3 Encounters:  07/28/17 130/78  10/19/16 100/72  10/05/16 124/80    Wt Readings from Last 3  Encounters:  07/28/17 97 lb 6.4 oz (44.2 kg)  10/19/16 97 lb 8 oz (44.2 kg)  10/05/16 99 lb 8 oz (45.1 kg)    General appearance: alert, cooperative and appears stated age Ears: normal TM's and external ear canals both ears Throat: lips, mucosa, and tongue normal; teeth and gums normal Neck: no adenopathy, no carotid bruit, supple, symmetrical, trachea midline and thyroid not enlarged, symmetric, no tenderness/mass/nodules Back: symmetric, no curvature. ROM normal. No CVA tenderness. Lungs: clear to auscultation bilaterally Heart: regular rate and rhythm, S1, S2 normal, no murmur, click, rub or gallop Abdomen: soft, non-tender; bowel sounds normal; no masses,  no organomegaly Pulses: 2+ and symmetric Skin: Skin color, texture, turgor normal. No rashes or lesions Lymph nodes: Cervical, supraclavicular, and axillary nodes normal.  Lab Results  Component Value Date   HGBA1C 6.4 03/14/2012    Lab Results  Component Value Date   CREATININE 0.75 07/28/2017   CREATININE 0.65 08/11/2016   CREATININE 0.65 10/02/2015    Lab Results  Component Value Date   WBC 6.2 12/21/2015   HGB 13.0 12/21/2015   HCT 41.9 08/29/2015   PLT 242 12/21/2015   GLUCOSE 89 07/28/2017   CHOL 177 07/28/2017   TRIG 75.0 07/28/2017   HDL 56.30 07/28/2017   LDLDIRECT 73 08/11/2016   LDLCALC 106 (H) 07/28/2017   ALT 9 07/28/2017   AST 16 07/28/2017   NA 144 07/28/2017   K 4.1 07/28/2017   CL 110 07/28/2017   CREATININE 0.75 07/28/2017   BUN 16 07/28/2017   CO2 26 07/28/2017   TSH 0.77 07/28/2017   HGBA1C 6.4 03/14/2012   MICROALBUR <0.7 08/29/2015     Assessment & Plan:   Problem List Items Addressed This Visit    B12 deficiency    Borderline.  Oral sublingual supplementation advised. Repeat level normal  Lab Results  Component Value Date   VITAMINB12 320 07/28/2017         Relevant Orders   Vitamin B12 (Completed)   Generalized anxiety disorder    Chief symptoms are anhedonia,  irritability, and anorexia.  No suicidality.  Adding lexapro,       Hyperlipidemia    Well controlled on current statin therapy.   Liver enzymes are normal , no changes today.  Lab Results  Component Value Date   CHOL 177 07/28/2017   HDL 56.30 07/28/2017   LDLCALC 106 (H) 07/28/2017   LDLDIRECT 73 08/11/2016   TRIG 75.0 07/28/2017   CHOLHDL 3 07/28/2017   Lab Results  Component Value Date   ALT 9 07/28/2017   AST 16 07/28/2017   ALKPHOS 82 07/28/2017   BILITOT 0.2 07/28/2017         Relevant Orders   Lipid panel (Completed)   Hypertension    Resume prior medicatons.  Lab Results  Component Value Date   CREATININE 0.75 07/28/2017   Lab Results  Component Value Date   NA 144 07/28/2017   K 4.1 07/28/2017   CL 110 07/28/2017   CO2 26 07/28/2017         Relevant Orders   Comprehensive metabolic panel (Completed)   Loss of weight    She has attributed her weight loss to the multiple emotional stressors, and to skipping breakfast, but is at increased risk of CA due to tobacco abuse.  CT chest abd pelvis offered but deferred      Relevant Orders   TSH (Completed)   Osteoporosis    Continue weekly therapy with  alendronate. Calcium and Vit D requirements discussed as well.       Tobacco abuse counseling    Spent 3 minutes discussing risk of continued tobacco abuse, including but not limited to CAD, PAD, hypertension, and CA.  sHe is not interested in pharmacotherapy at this time.       Other Visit Diagnoses    Screening breast examination    -  Primary     A total of 40 minutes of face to face time was spent with patient more than half of which was spent in counselling and coordination of care   I have changed Ms. Disbrow's linaclotide. I am also having her start on escitalopram. Additionally, I am having her maintain her cholecalciferol, ondansetron, Lactulose, amLODipine, traZODone, atorvastatin, predniSONE, levofloxacin, albuterol, alendronate, and  ALPRAZolam.  Meds ordered this encounter  Medications  . escitalopram (LEXAPRO) 10 MG tablet    Sig: Take 1 tablet (10 mg total) by mouth daily. After dinner    Dispense:  30 tablet    Refill:  2  . linaclotide (LINZESS) 72 MCG capsule    Sig: Take 1 capsule (72 mcg total) by mouth daily.    Dispense:  30 capsule    Refill:  0    Medications Discontinued During This Encounter  Medication Reason  . Linaclotide (LINZESS) 145 MCG CAPS capsule Reorder    Follow-up: No Follow-up on file.   Crecencio Mc, MD

## 2017-07-31 ENCOUNTER — Encounter: Payer: Self-pay | Admitting: Internal Medicine

## 2017-07-31 NOTE — Assessment & Plan Note (Signed)
Resume prior medicatons.  Lab Results  Component Value Date   CREATININE 0.75 07/28/2017   Lab Results  Component Value Date   NA 144 07/28/2017   K 4.1 07/28/2017   CL 110 07/28/2017   CO2 26 07/28/2017

## 2017-07-31 NOTE — Assessment & Plan Note (Signed)
She has attributed her weight loss to the multiple emotional stressors, and to skipping breakfast, but is at increased risk of CA due to tobacco abuse.  CT chest abd pelvis offered but deferred

## 2017-07-31 NOTE — Assessment & Plan Note (Signed)
Continue weekly therapy with  alendronate. Calcium and Vit D requirements discussed as well.

## 2017-07-31 NOTE — Assessment & Plan Note (Signed)
Chief symptoms are anhedonia, irritability, and anorexia.  No suicidality.  Adding lexapro,

## 2017-07-31 NOTE — Assessment & Plan Note (Signed)
Borderline.  Oral sublingual supplementation advised. Repeat level normal  Lab Results  Component Value Date   VITAMINB12 320 07/28/2017

## 2017-07-31 NOTE — Assessment & Plan Note (Signed)
Well controlled on current statin therapy.   Liver enzymes are normal , no changes today.  Lab Results  Component Value Date   CHOL 177 07/28/2017   HDL 56.30 07/28/2017   LDLCALC 106 (H) 07/28/2017   LDLDIRECT 73 08/11/2016   TRIG 75.0 07/28/2017   CHOLHDL 3 07/28/2017   Lab Results  Component Value Date   ALT 9 07/28/2017   AST 16 07/28/2017   ALKPHOS 82 07/28/2017   BILITOT 0.2 07/28/2017

## 2017-07-31 NOTE — Assessment & Plan Note (Signed)
Spent 3 minutes discussing risk of continued tobacco abuse, including but not limited to CAD, PAD, hypertension, and CA.  sHe is not interested in pharmacotherapy at this time. 

## 2017-08-02 NOTE — Telephone Encounter (Signed)
Pt called wanting to speak to [facility] regarding Dr Derrel Nip note. Thank you!

## 2017-08-02 NOTE — Telephone Encounter (Signed)
Left message for patient to return call to office. 

## 2017-08-05 ENCOUNTER — Ambulatory Visit (INDEPENDENT_AMBULATORY_CARE_PROVIDER_SITE_OTHER): Payer: 59 | Admitting: *Deleted

## 2017-08-05 DIAGNOSIS — E538 Deficiency of other specified B group vitamins: Secondary | ICD-10-CM

## 2017-08-05 MED ORDER — CYANOCOBALAMIN 1000 MCG/ML IJ SOLN
1000.0000 ug | Freq: Once | INTRAMUSCULAR | Status: AC
  Administered 2017-08-05: 1000 ug via INTRAMUSCULAR

## 2017-08-05 NOTE — Progress Notes (Addendum)
Patient presented for B 12 injection to left deltoid, patient voiced no concerns nor showed any signs of distress during injection First of 3 weekly injections  Reviewed.  Dr Nicki Reaper

## 2017-08-09 ENCOUNTER — Telehealth: Payer: Self-pay | Admitting: Internal Medicine

## 2017-08-09 NOTE — Telephone Encounter (Signed)
PA started for Linzess second authorization. Pa Started with [employer] form completed .

## 2017-08-12 ENCOUNTER — Ambulatory Visit (INDEPENDENT_AMBULATORY_CARE_PROVIDER_SITE_OTHER): Payer: 59

## 2017-08-12 DIAGNOSIS — E538 Deficiency of other specified B group vitamins: Secondary | ICD-10-CM

## 2017-08-12 MED ORDER — CYANOCOBALAMIN 1000 MCG/ML IJ SOLN
1000.0000 ug | Freq: Once | INTRAMUSCULAR | Status: AC
  Administered 2017-08-12: 1000 ug via INTRAMUSCULAR

## 2017-08-12 NOTE — Progress Notes (Signed)
Patient comes in for second weekly B 12 injection. Injected right deltoid. Patient tolerated injection well.   Reviewed.  Dr Nicki Reaper

## 2017-08-13 NOTE — Telephone Encounter (Addendum)
PA approved through 08/11/2018. Pt and pharmacy are aware.   PA # [id number]

## 2017-08-19 ENCOUNTER — Ambulatory Visit (INDEPENDENT_AMBULATORY_CARE_PROVIDER_SITE_OTHER): Payer: 59 | Admitting: *Deleted

## 2017-08-19 DIAGNOSIS — E538 Deficiency of other specified B group vitamins: Secondary | ICD-10-CM | POA: Diagnosis not present

## 2017-08-19 MED ORDER — CYANOCOBALAMIN 1000 MCG/ML IJ SOLN
1000.0000 ug | Freq: Once | INTRAMUSCULAR | Status: AC
  Administered 2017-08-19: 1000 ug via INTRAMUSCULAR

## 2017-08-19 NOTE — Progress Notes (Addendum)
Patient presented for B 12 injection to left deltoid, patient voiced no concerns nor showed any signs of distress during injection.  Reviewed.  Dr Scott 

## 2017-08-25 ENCOUNTER — Other Ambulatory Visit: Payer: Self-pay | Admitting: Internal Medicine

## 2017-08-25 NOTE — Telephone Encounter (Signed)
Refilled for 6 months  

## 2017-08-25 NOTE — Telephone Encounter (Signed)
Last OV 07/28/2017 Next OV not scheduled Last refill 07/14/2017

## 2017-08-25 NOTE — Telephone Encounter (Signed)
Printed, signed and faxed.  

## 2017-08-26 ENCOUNTER — Ambulatory Visit (INDEPENDENT_AMBULATORY_CARE_PROVIDER_SITE_OTHER): Payer: 59 | Admitting: *Deleted

## 2017-08-26 DIAGNOSIS — E538 Deficiency of other specified B group vitamins: Secondary | ICD-10-CM | POA: Diagnosis not present

## 2017-08-26 MED ORDER — CYANOCOBALAMIN 1000 MCG/ML IJ SOLN
1000.0000 ug | Freq: Once | INTRAMUSCULAR | Status: AC
  Administered 2017-08-26: 1000 ug via INTRAMUSCULAR

## 2017-08-26 NOTE — Progress Notes (Addendum)
Patient presented for B 12 injection to left deltoid, patient voiced no concerns nor showed any signs of distress during injection.  Reviewed.  Dr Scott 

## 2017-08-31 ENCOUNTER — Ambulatory Visit
Admission: RE | Admit: 2017-08-31 | Discharge: 2017-08-31 | Disposition: A | Discharge status: Home / Self Care | Payer: 59 | Source: Ambulatory Visit | Attending: Internal Medicine | Admitting: Internal Medicine

## 2017-08-31 DIAGNOSIS — Z1231 Encounter for screening mammogram for malignant neoplasm of breast: Secondary | ICD-10-CM | POA: Insufficient documentation

## 2017-08-31 DIAGNOSIS — Z1239 Encounter for other screening for malignant neoplasm of breast: Secondary | ICD-10-CM

## 2017-09-01 ENCOUNTER — Encounter: Payer: Self-pay | Admitting: Internal Medicine

## 2017-09-22 ENCOUNTER — Other Ambulatory Visit: Payer: Self-pay | Admitting: Internal Medicine

## 2017-10-05 ENCOUNTER — Ambulatory Visit (INDEPENDENT_AMBULATORY_CARE_PROVIDER_SITE_OTHER): Payer: 59 | Admitting: *Deleted

## 2017-10-05 DIAGNOSIS — E538 Deficiency of other specified B group vitamins: Secondary | ICD-10-CM

## 2017-10-05 MED ORDER — CYANOCOBALAMIN 1000 MCG/ML IJ SOLN
1000.0000 ug | Freq: Once | INTRAMUSCULAR | Status: AC
  Administered 2017-10-05: 1000 ug via INTRAMUSCULAR

## 2017-10-05 NOTE — Progress Notes (Signed)
Patient presented for B 12 injection to left deltoid, patient voiced no concerns nor showed any signs of distress during injection. 

## 2017-10-07 NOTE — Progress Notes (Signed)
  I have reviewed the above information and agree with above.   Teresa Tullo, MD 

## 2017-11-02 ENCOUNTER — Ambulatory Visit (INDEPENDENT_AMBULATORY_CARE_PROVIDER_SITE_OTHER): Payer: 59 | Admitting: Internal Medicine

## 2017-11-02 ENCOUNTER — Encounter: Payer: Self-pay | Admitting: Internal Medicine

## 2017-11-02 VITALS — BP 130/78 | HR 63 | Temp 98.4°F | Resp 15 | Ht 62.0 in | Wt 101.2 lb

## 2017-11-02 DIAGNOSIS — K5909 Other constipation: Secondary | ICD-10-CM

## 2017-11-02 DIAGNOSIS — I700 Atherosclerosis of aorta: Secondary | ICD-10-CM

## 2017-11-02 DIAGNOSIS — Z0000 Encounter for general adult medical examination without abnormal findings: Secondary | ICD-10-CM | POA: Diagnosis not present

## 2017-11-02 DIAGNOSIS — E7800 Pure hypercholesterolemia, unspecified: Secondary | ICD-10-CM | POA: Diagnosis not present

## 2017-11-02 DIAGNOSIS — I10 Essential (primary) hypertension: Secondary | ICD-10-CM | POA: Diagnosis not present

## 2017-11-02 DIAGNOSIS — E538 Deficiency of other specified B group vitamins: Secondary | ICD-10-CM | POA: Diagnosis not present

## 2017-11-02 DIAGNOSIS — Z720 Tobacco use: Secondary | ICD-10-CM

## 2017-11-02 NOTE — Progress Notes (Signed)
Patient ID: Sherry Petersen, female    DOB: [birth year]  Age: 56 y.o. MRN: [id number]  The patient is here for annual preventive examination and management of other chronic and acute problems.  Mammogram has been done,  PAP was normal in  2016 declines influenza vaccine due to prior adverse reaction (not an allergic reaction)    The risk factors are reflected in the social history.  The roster of all physicians providing medical care to patient - is listed in the Snapshot section of the chart.  Activities of daily living:  The patient is 100% independent in all ADLs: dressing, toileting, feeding as well as independent mobility  Home safety : The patient has smoke detectors in the home. They wear seatbelts.  There are no firearms at home. There is no violence in the home.   There is no risks for hepatitis, STDs or HIV. There is no   history of blood transfusion. They have no travel history to infectious disease endemic areas of the world.  The patient has seen their dentist in the last six month. They have not seen their eye doctor in the last year. .  They do not  have excessive sun exposure. Discussed the need for sun protection: hats, long sleeves and use of sunscreen if there is significant sun exposure.   Diet: the importance of a healthy diet is discussed. They do have a healthy diet.  The benefits of regular aerobic exercise were discussed. She does not exercise.   Depression screen: there are no signs or vegative symptoms of depression- irritability, change in appetite, anhedonia, sadness/tearfullness.   The following portions of the patient's history were reviewed and updated as appropriate: allergies, current medications, past family history, past medical history,  past surgical history, past social history  and problem list.  Visual acuity was not assessed per patient preference since she has regular follow up with her ophthalmologist. Hearing and body mass index were assessed and  reviewed.   During the course of the visit the patient was educated and counseled about appropriate screening and preventive services including : fall prevention , diabetes screening, nutrition counseling, colorectal cancer screening, and recommended immunizations.    CC: The primary encounter diagnosis was Encounter for preventive health examination. Diagnoses of B12 deficiency, Pure hypercholesterolemia, Constipation, chronic, Essential hypertension, Tobacco abuse, and Atherosclerosis of aorta ([place]) were also pertinent to this visit. Getting b12 injections for B12 deficiency diagnosed in August . Last injection oct 23  Has not been  taking lipitor or amlodipine  for the past 2 months.   Emotional stressors:  After 13 yrs at the same job, she is looking to leave.  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    Had to put down her beloved dog after 73 yrs  2 yr old grandson fell 15 feet after slipping under the railing at her daughter's apartment complex  Still smoking.  Not interested in pharmacotherapy   History Sherry Petersen has a past medical history of Anxiety and Hematuria.   She has a past surgical history that includes Carpal tunnel release (2003); Vaginal hysterectomy (2002); and Tonsillectomy (2012).   Her family history includes Diabetes in her mother; Early death in her father; Heart attack in her brother, father, and sister; Heart disease in her father; Mental illness in her mother.She reports that she has been smoking cigarettes.  She has  a 7.50 pack-year smoking history. she has never used smokeless tobacco. She reports that she does not drink alcohol or use drugs.  Outpatient Medications Prior to Visit  Medication Sig Dispense Refill  . ALPRAZolam (XANAX) 1 MG tablet TAKE 1 TABLET BY MOUTH EVERY NIGHT AT BEDTIME 30 tablet 5  . cholecalciferol (VITAMIN D) 1000 UNITS tablet Take 1,000  Units by mouth daily.     Marland Kitchen linaclotide (LINZESS) 72 MCG capsule Take 1 capsule (72 mcg total) by mouth daily. 30 capsule 0  . albuterol (PROVENTIL HFA;VENTOLIN HFA) 108 (90 Base) MCG/ACT inhaler Inhale 2 puffs into the lungs every 6 (six) hours as needed for wheezing or shortness of breath. (Patient not taking: Reported on 11/02/2017) 6.7 g 3  . Lactulose 20 GM/30ML SOLN 30 ml every 4 hours until constipation is relieved (Patient not taking: Reported on 07/28/2017) 236 mL 3  . ondansetron (ZOFRAN) 4 MG tablet Take 1 tablet (4 mg total) by mouth every 8 (eight) hours as needed for nausea or vomiting. (Patient not taking: Reported on 11/02/2017) 20 tablet 3  . alendronate (FOSAMAX) 70 MG tablet Take 1 tablet (70 mg total) by mouth every 7 (seven) days. Take with a full glass of water on an empty stomach. (Patient not taking: Reported on 07/28/2017) 4 tablet 11  . amLODipine (NORVASC) 10 MG tablet TAKE 1 TABLET(10 MG) BY MOUTH DAILY (Patient not taking: Reported on 11/02/2017) 90 tablet 2  . atorvastatin (LIPITOR) 20 MG tablet TAKE 1 TABLET(20 MG) BY MOUTH DAILY (Patient not taking: Reported on 11/02/2017) 90 tablet 1  . escitalopram (LEXAPRO) 10 MG tablet TAKE 1 TABLET(10 MG) BY MOUTH DAILY AFTER DINNER (Patient not taking: Reported on 11/02/2017) 90 tablet 2  . levofloxacin (LEVAQUIN) 500 MG tablet Take 1 tablet (500 mg total) by mouth daily. (Patient not taking: Reported on 07/28/2017) 7 tablet 0  . predniSONE (DELTASONE) 10 MG tablet 6 tablets on Day 1 , then reduce by 1 tablet daily until gone (Patient not taking: Reported on 10/19/2016) 21 tablet 0  . traZODone (DESYREL) 50 MG tablet Take 0.5-1 tablets (25-50 mg total) by mouth at bedtime as needed for sleep. (Patient not taking: Reported on 07/28/2017) 30 tablet 3   No facility-administered medications prior to visit.     Review of Systems  Patient denies headache, fevers, malaise, unintentional weight loss, skin rash, eye pain, sinus congestion  and sinus pain, sore throat, dysphagia,  hemoptysis , cough, dyspnea, wheezing, chest pain, palpitations, orthopnea, edema, abdominal pain, nausea, melena, diarrhea, constipation, flank pain, dysuria, hematuria, urinary  Frequency, nocturia, numbness, tingling, seizures,  Focal weakness, Loss of consciousness,  Tremor, insomnia, depression, anxiety, and suicidal ideation.     Objective:  BP 130/78 (BP Location: Left Arm, Patient Position: Sitting, Cuff Size: Normal)   Pulse 63   Temp 98.4 F (36.9 C) (Oral)   Resp 15   Ht 5\' 2"  (1.575 m)   Wt 101 lb 3.2 oz (45.9 kg)   SpO2 97%   BMI 18.51 kg/m   Physical Exam   General appearance: alert, cooperative and appears stated age Head: Normocephalic, without obvious abnormality, atraumatic Eyes: conjunctivae/corneas clear. PERRL, EOM's intact. Fundi benign. Ears: normal TM's and external ear canals both ears Nose: Nares normal. Septum midline. Mucosa normal. No drainage or sinus tenderness. Throat: lips, mucosa, and tongue normal; teeth and gums normal Neck: no adenopathy, no carotid bruit, no JVD, supple, symmetrical, trachea midline and thyroid not enlarged, symmetric, no tenderness/mass/nodules Lungs: clear to auscultation  bilaterally Breasts: normal appearance, no masses or tenderness Heart: regular rate and rhythm, S1, S2 normal, no murmur, click, rub or gallop Abdomen: soft, non-tender; bowel sounds normal; no masses,  no organomegaly Extremities: extremities normal, atraumatic, no cyanosis or edema Pulses: 2+ and symmetric Skin: Skin color, texture, turgor normal. No rashes or lesions Neurologic: Alert and oriented X 3, normal strength and tone. Normal symmetric reflexes. Normal coordination and gait.      Assessment & Plan:   Problem List Items Addressed This Visit    Atherosclerosis of aorta ([place])    Without aneurysm  Noted on CT 2016 . Statin advised.       B12 deficiency    Continue injections pending evaluation of  intrinsic factor ab  Lab Results  Component Value Date   VITAMINB12 320 07/28/2017         Relevant Orders   Intrinsic Factor Antibodies   Constipation, chronic   Relevant Orders   Comprehensive metabolic panel (Completed)   Encounter for preventive health examination - Primary    Annual comprehensive preventive exam was done as well as an evaluation and management of chronic conditions .  During the course of the visit the patient was educated and counseled about appropriate screening and preventive services including :  diabetes screening, lipid analysis with projected  10 year  risk for CAD , nutrition counseling, breast, cervical and colorectal cancer screening, and recommended immunizations.  Printed recommendations for health maintenance screenings was given and smoking cessation advised.       Hyperlipidemia    Based on current lipid profile, the risk of clinically significant Coronary artery disease is 11% over the next 10 years, using the Framingham risk calculator, because of her tobacco abuse.  Aortoiliac atherosclerosis was noted on 2016  Renal CT.  Will recommend resuming atorvastatin   Lab Results  Component Value Date   CHOL 183 11/02/2017   HDL 48.10 11/02/2017   LDLCALC 124 (H) 11/02/2017   LDLDIRECT 73 08/11/2016   TRIG 51.0 11/02/2017   CHOLHDL 4 11/02/2017         Relevant Orders   Lipid panel (Completed)   Hypertension    She has stopped taking amlldipine and her bp is near goal  without amlodipine.  Discussed the new guidelines of 120/70 and  Advised to check pressure periodically and resume 5 mg dose for sbp > 120/70      Tobacco abuse    Spent < 3  minutes discussing risk of continued tobacco abuse.  She is  not interested in pharmacotherapy at this time.         I have discontinued Ryley C. Sumlin's amLODipine, traZODone, atorvastatin, predniSONE, levofloxacin, alendronate, and escitalopram. I am also having her maintain her cholecalciferol,  ondansetron, Lactulose, albuterol, linaclotide, and ALPRAZolam.  No orders of the defined types were placed in this encounter.   Medications Discontinued During This Encounter  Medication Reason  . alendronate (FOSAMAX) 70 MG tablet Patient has not taken in last 30 days  . amLODipine (NORVASC) 10 MG tablet Patient has not taken in last 30 days  . atorvastatin (LIPITOR) 20 MG tablet Patient has not taken in last 30 days  . escitalopram (LEXAPRO) 10 MG tablet Patient has not taken in last 30 days  . levofloxacin (LEVAQUIN) 500 MG tablet Patient has not taken in last 30 days  . predniSONE (DELTASONE) 10 MG tablet Patient has not taken in last 30 days  . traZODone (DESYREL) 50 MG tablet Patient has  not taken in last 30 days    Follow-up: Return in about 6 months (around 05/02/2018).   Crecencio Mc, MD

## 2017-11-02 NOTE — Patient Instructions (Signed)
Health Maintenance for Postmenopausal Women Menopause is a normal process in which your reproductive ability comes to an end. This process happens gradually over a span of months to years, usually between the ages of 22 and 9. Menopause is complete when you have missed 12 consecutive menstrual periods. It is important to talk with your health care provider about some of the most common conditions that affect postmenopausal women, such as heart disease, cancer, and bone loss (osteoporosis). Adopting a healthy lifestyle and getting preventive care can help to promote your health and wellness. Those actions can also lower your chances of developing some of these common conditions. What should I know about menopause? During menopause, you may experience a number of symptoms, such as:  Moderate-to-severe hot flashes.  Night sweats.  Decrease in sex drive.  Mood swings.  Headaches.  Tiredness.  Irritability.  Memory problems.  Insomnia.  Choosing to treat or not to treat menopausal changes is an individual decision that you make with your health care provider. What should I know about hormone replacement therapy and supplements? Hormone therapy products are effective for treating symptoms that are associated with menopause, such as hot flashes and night sweats. Hormone replacement carries certain risks, especially as you become older. If you are thinking about using estrogen or estrogen with progestin treatments, discuss the benefits and risks with your health care provider. What should I know about heart disease and stroke? Heart disease, heart attack, and stroke become more likely as you age. This may be due, in part, to the hormonal changes that your body experiences during menopause. These can affect how your body processes dietary fats, triglycerides, and cholesterol. Heart attack and stroke are both medical emergencies. There are many things that you can do to help prevent heart disease  and stroke:  Have your blood pressure checked at least every 1-2 years. High blood pressure causes heart disease and increases the risk of stroke.  If you are 53-22 years old, ask your health care provider if you should take aspirin to prevent a heart attack or a stroke.  Do not use any tobacco products, including cigarettes, chewing tobacco, or electronic cigarettes. If you need help quitting, ask your health care provider.  It is important to eat a healthy diet and maintain a healthy weight. ? Be sure to include plenty of vegetables, fruits, low-fat dairy products, and lean protein. ? Avoid eating foods that are high in solid fats, added sugars, or salt (sodium).  Get regular exercise. This is one of the most important things that you can do for your health. ? Try to exercise for at least 150 minutes each week. The type of exercise that you do should increase your heart rate and make you sweat. This is known as moderate-intensity exercise. ? Try to do strengthening exercises at least twice each week. Do these in addition to the moderate-intensity exercise.  Know your numbers.Ask your health care provider to check your cholesterol and your blood glucose. Continue to have your blood tested as directed by your health care provider.  What should I know about cancer screening? There are several types of cancer. Take the following steps to reduce your risk and to catch any cancer development as early as possible. Breast Cancer  Practice breast self-awareness. ? This means understanding how your breasts normally appear and feel. ? It also means doing regular breast self-exams. Let your health care provider know about any changes, no matter how small.  If you are 40  or older, have a clinician do a breast exam (clinical breast exam or CBE) every year. Depending on your age, family history, and medical history, it may be recommended that you also have a yearly breast X-ray (mammogram).  If you  have a family history of breast cancer, talk with your health care provider about genetic screening.  If you are at high risk for breast cancer, talk with your health care provider about having an MRI and a mammogram every year.  Breast cancer (BRCA) gene test is recommended for women who have family members with BRCA-related cancers. Results of the assessment will determine the need for genetic counseling and BRCA1 and for BRCA2 testing. BRCA-related cancers include these types: ? Breast. This occurs in males or females. ? Ovarian. ? Tubal. This may also be called fallopian tube cancer. ? Cancer of the abdominal or pelvic lining (peritoneal cancer). ? Prostate. ? Pancreatic.  Cervical, Uterine, and Ovarian Cancer Your health care provider may recommend that you be screened regularly for cancer of the pelvic organs. These include your ovaries, uterus, and vagina. This screening involves a pelvic exam, which includes checking for microscopic changes to the surface of your cervix (Pap test).  For women ages 21-65, health care providers may recommend a pelvic exam and a Pap test every three years. For women ages 79-65, they may recommend the Pap test and pelvic exam, combined with testing for human papilloma virus (HPV), every five years. Some types of HPV increase your risk of cervical cancer. Testing for HPV may also be done on women of any age who have unclear Pap test results.  Other health care providers may not recommend any screening for nonpregnant women who are considered low risk for pelvic cancer and have no symptoms. Ask your health care provider if a screening pelvic exam is right for you.  If you have had past treatment for cervical cancer or a condition that could lead to cancer, you need Pap tests and screening for cancer for at least 20 years after your treatment. If Pap tests have been discontinued for you, your risk factors (such as having a new sexual partner) need to be  reassessed to determine if you should start having screenings again. Some women have medical problems that increase the chance of getting cervical cancer. In these cases, your health care provider may recommend that you have screening and Pap tests more often.  If you have a family history of uterine cancer or ovarian cancer, talk with your health care provider about genetic screening.  If you have vaginal bleeding after reaching menopause, tell your health care provider.  There are currently no reliable tests available to screen for ovarian cancer.  Lung Cancer Lung cancer screening is recommended for adults 69-62 years old who are at high risk for lung cancer because of a history of smoking. A yearly low-dose CT scan of the lungs is recommended if you:  Currently smoke.  Have a history of at least 30 pack-years of smoking and you currently smoke or have quit within the past 15 years. A pack-year is smoking an average of one pack of cigarettes per day for one year.  Yearly screening should:  Continue until it has been 15 years since you quit.  Stop if you develop a health problem that would prevent you from having lung cancer treatment.  Colorectal Cancer  This type of cancer can be detected and can often be prevented.  Routine colorectal cancer screening usually begins at  age 42 and continues through age 45.  If you have risk factors for colon cancer, your health care provider may recommend that you be screened at an earlier age.  If you have a family history of colorectal cancer, talk with your health care provider about genetic screening.  Your health care provider may also recommend using home test kits to check for hidden blood in your stool.  A small camera at the end of a tube can be used to examine your colon directly (sigmoidoscopy or colonoscopy). This is done to check for the earliest forms of colorectal cancer.  Direct examination of the colon should be repeated every  5-10 years until age 71. However, if early forms of precancerous polyps or small growths are found or if you have a family history or genetic risk for colorectal cancer, you may need to be screened more often.  Skin Cancer  Check your skin from head to toe regularly.  Monitor any moles. Be sure to tell your health care provider: ? About any new moles or changes in moles, especially if there is a change in a mole's shape or color. ? If you have a mole that is larger than the size of a pencil eraser.  If any of your family members has a history of skin cancer, especially at a young age, talk with your health care provider about genetic screening.  Always use sunscreen. Apply sunscreen liberally and repeatedly throughout the day.  Whenever you are outside, protect yourself by wearing long sleeves, pants, a wide-brimmed hat, and sunglasses.  What should I know about osteoporosis? Osteoporosis is a condition in which bone destruction happens more quickly than new bone creation. After menopause, you may be at an increased risk for osteoporosis. To help prevent osteoporosis or the bone fractures that can happen because of osteoporosis, the following is recommended:  If you are 46-71 years old, get at least 1,000 mg of calcium and at least 600 mg of vitamin D per day.  If you are older than age 55 but younger than age 65, get at least 1,200 mg of calcium and at least 600 mg of vitamin D per day.  If you are older than age 54, get at least 1,200 mg of calcium and at least 800 mg of vitamin D per day.  Smoking and excessive alcohol intake increase the risk of osteoporosis. Eat foods that are rich in calcium and vitamin D, and do weight-bearing exercises several times each week as directed by your health care provider. What should I know about how menopause affects my mental health? Depression may occur at any age, but it is more common as you become older. Common symptoms of depression  include:  Low or sad mood.  Changes in sleep patterns.  Changes in appetite or eating patterns.  Feeling an overall lack of motivation or enjoyment of activities that you previously enjoyed.  Frequent crying spells.  Talk with your health care provider if you think that you are experiencing depression. What should I know about immunizations? It is important that you get and maintain your immunizations. These include:  Tetanus, diphtheria, and pertussis (Tdap) booster vaccine.  Influenza every year before the flu season begins.  Pneumonia vaccine.  Shingles vaccine.  Your health care provider may also recommend other immunizations. This information is not intended to replace advice given to you by your health care provider. Make sure you discuss any questions you have with your health care provider. Document Released: 01/22/2006  Document Revised: 06/19/2016 Document Reviewed: 09/03/2015 Elsevier Interactive Patient Education  2018 [employer]  

## 2017-11-03 LAB — COMPREHENSIVE METABOLIC PANEL
ALBUMIN: 4.2 g/dL (ref 3.5–5.2)
ALT: 13 U/L (ref 0–35)
AST: 21 U/L (ref 0–37)
Alkaline Phosphatase: 75 U/L (ref 39–117)
BUN: 16 mg/dL (ref 6–23)
CALCIUM: 9.1 mg/dL (ref 8.4–10.5)
CHLORIDE: 109 meq/L (ref 96–112)
CO2: 25 mEq/L (ref 19–32)
Creatinine, Ser: 0.73 mg/dL (ref 0.40–1.20)
GFR: 86.77 mL/min (ref >60.00)
Glucose, Bld: 85 mg/dL (ref 70–99)
POTASSIUM: 3.8 meq/L (ref 3.5–5.1)
Sodium: 144 mEq/L (ref 135–145)
Total Bilirubin: 0.2 mg/dL (ref 0.2–1.2)
Total Protein: 6.5 g/dL (ref 6.0–8.3)

## 2017-11-03 LAB — LIPID PANEL
Cholesterol: 183 mg/dL (ref 0–200)
HDL: 48.1 mg/dL (ref >39.00)
LDL CALC: 124 mg/dL — AB (ref 0–99)
NonHDL: 134.41
TRIGLYCERIDES: 51 mg/dL (ref 0.0–149.0)
Total CHOL/HDL Ratio: 4
VLDL: 10.2 mg/dL (ref 0.0–40.0)

## 2017-11-04 ENCOUNTER — Encounter: Payer: Self-pay | Admitting: Internal Medicine

## 2017-11-04 DIAGNOSIS — I700 Atherosclerosis of aorta: Secondary | ICD-10-CM | POA: Insufficient documentation

## 2017-11-04 NOTE — Assessment & Plan Note (Addendum)
Based on current lipid profile, the risk of clinically significant Coronary artery disease is 11% over the next 10 years, using the Framingham risk calculator, because of her tobacco abuse.  Aortoiliac atherosclerosis was noted on 2016  Renal CT.  Will recommend resuming atorvastatin   Lab Results  Component Value Date   CHOL 183 11/02/2017   HDL 48.10 11/02/2017   LDLCALC 124 (H) 11/02/2017   LDLDIRECT 73 08/11/2016   TRIG 51.0 11/02/2017   CHOLHDL 4 11/02/2017

## 2017-11-04 NOTE — Assessment & Plan Note (Signed)
Without aneurysm  Noted on CT 2016 . Statin advised.

## 2017-11-04 NOTE — Assessment & Plan Note (Signed)
She has stopped taking amlldipine and her bp is near goal  without amlodipine.  Discussed the new guidelines of 120/70 and  Advised to check pressure periodically and resume 5 mg dose for sbp > 120/70

## 2017-11-04 NOTE — Assessment & Plan Note (Signed)
Continue injections pending evaluation of intrinsic factor ab  Lab Results  Component Value Date   VITAMINB12 320 07/28/2017

## 2017-11-04 NOTE — Assessment & Plan Note (Addendum)
Spent < 3  minutes discussing risk of continued tobacco abuse.  She is  not interested in pharmacotherapy at this time.

## 2017-11-04 NOTE — Assessment & Plan Note (Signed)
Annual comprehensive preventive exam was done as well as an evaluation and management of chronic conditions .  During the course of the visit the patient was educated and counseled about appropriate screening and preventive services including :  diabetes screening, lipid analysis with projected  10 year  risk for CAD , nutrition counseling, breast, cervical and colorectal cancer screening, and recommended immunizations.  Printed recommendations for health maintenance screenings was given and smoking cessation advised.

## 2017-11-05 LAB — INTRINSIC FACTOR ANTIBODIES: INTRINSIC FACTOR: NEGATIVE

## 2017-11-06 ENCOUNTER — Encounter: Payer: Self-pay | Admitting: Internal Medicine

## 2017-11-09 ENCOUNTER — Ambulatory Visit: Payer: 59 | Admitting: *Deleted

## 2018-02-07 ENCOUNTER — Telehealth: Payer: Self-pay

## 2018-02-07 MED ORDER — ALPRAZOLAM 1 MG PO TABS: 1.0000 mg | ORAL_TABLET | Freq: Every day | ORAL | 3 refills | Status: DC

## 2018-02-07 NOTE — Telephone Encounter (Signed)
Refilled: 08/25/2017 Last OV: 11/02/2017 Next OV: not scheduled

## 2018-02-08 NOTE — Telephone Encounter (Signed)
Please advise 

## 2018-02-08 NOTE — Telephone Encounter (Signed)
Left detailed message to let pt know that we have faxed her rx to the pharmacy.

## 2018-02-08 NOTE — Telephone Encounter (Signed)
PT called to check on refill status of xanax  [facility] [place], [address] [phone] (Phone) [phone] (Fax)

## 2018-03-14 ENCOUNTER — Other Ambulatory Visit: Payer: Self-pay

## 2018-03-14 ENCOUNTER — Ambulatory Visit (INDEPENDENT_AMBULATORY_CARE_PROVIDER_SITE_OTHER): Payer: 59 | Admitting: Internal Medicine

## 2018-03-14 ENCOUNTER — Encounter: Payer: Self-pay | Admitting: Internal Medicine

## 2018-03-14 VITALS — BP 144/88 | HR 55 | Temp 98.3°F | Wt 102.2 lb

## 2018-03-14 DIAGNOSIS — E7800 Pure hypercholesterolemia, unspecified: Secondary | ICD-10-CM

## 2018-03-14 DIAGNOSIS — M5090 Cervical disc disorder, unspecified, unspecified cervical region: Secondary | ICD-10-CM

## 2018-03-14 DIAGNOSIS — Z716 Tobacco abuse counseling: Secondary | ICD-10-CM

## 2018-03-14 DIAGNOSIS — R51 Headache: Secondary | ICD-10-CM

## 2018-03-14 DIAGNOSIS — I10 Essential (primary) hypertension: Secondary | ICD-10-CM

## 2018-03-14 DIAGNOSIS — F411 Generalized anxiety disorder: Secondary | ICD-10-CM | POA: Diagnosis not present

## 2018-03-14 DIAGNOSIS — L989 Disorder of the skin and subcutaneous tissue, unspecified: Secondary | ICD-10-CM | POA: Diagnosis not present

## 2018-03-14 DIAGNOSIS — R519 Headache, unspecified: Secondary | ICD-10-CM

## 2018-03-14 MED ORDER — AMLODIPINE BESYLATE 2.5 MG PO TABS
2.5000 mg | ORAL_TABLET | Freq: Every day | ORAL | 3 refills | Status: DC
Start: 2018-03-14 — End: 2019-05-05

## 2018-03-14 MED ORDER — ATORVASTATIN CALCIUM 20 MG PO TABS: 20.0000 mg | ORAL_TABLET | Freq: Every day | ORAL | 3 refills | Status: DC

## 2018-03-14 MED ORDER — SUMATRIPTAN 5 MG/ACT NA SOLN: 1.0000 | NASAL | 0 refills | Status: DC | PRN

## 2018-03-14 MED ORDER — ONDANSETRON 4 MG PO TBDP: 4.0000 mg | ORAL_TABLET | Freq: Three times a day (TID) | ORAL | 0 refills | Status: DC | PRN

## 2018-03-14 NOTE — Patient Instructions (Addendum)
Trial of imitrex nasal spray  for treatment of next bad headache that sounds like new onset migraine   zofran ODT  (orally dissolving tablet)  For nausea   If no improvement once re resume your amlodipine,   We will need to  arrange  an MRI of brain   Return to see me and for labs in 2 months    Sumatriptan nasal spray What is this medicine? SUMATRIPTAN (soo ma TRIP tan) is used to treat migraines with or without aura. An aura is a strange feeling or visual disturbance that warns you of an attack. It is not used to prevent migraines. This medicine may be used for other purposes; ask your health care provider or pharmacist if you have questions. COMMON BRAND NAME(S): Imitrex What should I tell my health care provider before I take this medicine? They need to know if you have any of these conditions: -circulation problems in fingers and toes -diabetes -heart disease -high blood pressure -high cholesterol -history of irregular heartbeat -history of stroke -kidney disease -liver disease -postmenopausal or surgical removal of uterus and ovaries -seizures -smoke tobacco -stomach or intestine problems -an unusual or allergic reaction to sumatriptan, other medicines, foods, dyes, or preservatives -pregnant or trying to get pregnant -breast-feeding How should I use this medicine? This medicine is for use in the nose. Follow the directions on the prescription label. This medicine is taken at the first symptoms of a migraine. It is not for everyday use. Do not take your medicine more often than directed. Talk to your pediatrician regarding the use of this medicine in children. Special care may be needed. Overdosage: If you think you have taken too much of this medicine contact a poison control center or emergency room at once. NOTE: This medicine is only for you. Do not share this medicine with others. What if I miss a dose? This does not apply; this medicine is not for regular use. What  may interact with this medicine? Do not take this medicine with any of the following medicines: -cocaine -ergot alkaloids like dihydroergotamine, ergonovine, ergotamine, methylergonovine -feverfew -MAOIs like Carbex, Eldepryl, Marplan, Nardil, and Parnate -other medicines for migraine headache like almotriptan, eletriptan, frovatriptan, naratriptan, rizatriptan, zolmitriptan -tryptophan This medicine may also interact with the following medications: -certain medicines for depression, anxiety, or psychotic disturbances This list may not describe all possible interactions. Give your health care provider a list of all the medicines, herbs, non-prescription drugs, or dietary supplements you use. Also tell them if you smoke, drink alcohol, or use illegal drugs. Some items may interact with your medicine. What should I watch for while using this medicine? Only take this medicine for a migraine headache. Take it if you get warning symptoms or at the start of a migraine attack. It is not for regular use to prevent migraine attacks. You may get drowsy or dizzy. Do not drive, use machinery, or do anything that needs mental alertness until you know how this medicine affects you. To reduce dizzy or fainting spells, do not sit or stand up quickly, especially if you are an older patient. Alcohol can increase drowsiness, dizziness and flushing. Avoid alcoholic drinks. Smoking cigarettes may increase the risk of heart-related side effects from using this medicine. If you take migraine medicines for 10 or more days a month, your migraines may get worse. Keep a diary of headache days and medicine use. Contact your healthcare professional if your migraine attacks occur more frequently. What side effects may I notice  from receiving this medicine? Side effects that you should report to your doctor or health care professional as soon as possible: -allergic reactions like skin rash, itching or hives, swelling of the  face, lips, or tongue -bloody or watery diarrhea -hallucination, loss of contact with reality -pain, tingling, numbness in the face, hands, or feet -seizures -signs and symptoms of a blood clot such as breathing problems; changes in vision; chest pain; severe, sudden headache; pain, swelling, warmth in the leg; trouble speaking; sudden numbness or weakness of the face, arm, or leg -signs and symptoms of a dangerous change in heartbeat or heart rhythm like chest pain; dizziness; fast or irregular heartbeat; palpitations, feeling faint or lightheaded; falls; breathing problems -signs and symptoms of a stroke like changes in vision; confusion; trouble speaking or understanding; severe headaches; sudden numbness or weakness of the face, arm, or leg; trouble walking; dizziness; loss of balance or coordination -stomach pain Side effects that usually do not require medical attention (report to your doctor or health care professional if they continue or are bothersome): -changes in taste -facial flushing -headache -muscle cramps -muscle pain -nausea, vomiting -weak or tired This list may not describe all possible side effects. Call your doctor for medical advice about side effects. You may report side effects to FDA at 1-800-FDA-1088. Where should I keep my medicine? Keep out of the reach of children. Store at room temperature between 2 and 30 degrees C (36 and 86 degrees F). Throw away any unused medicine after the expiration date. NOTE: This sheet is a summary. It may not cover all possible information. If you have questions about this medicine, talk to your doctor, pharmacist, or health care provider.  2018 Elsevier/Gold Standard (2016-01-02 12:43:05)

## 2018-03-14 NOTE — Progress Notes (Signed)
Subjective:  Patient ID: Sherry Petersen, female    DOB: 04-09-1961  Age: 57 y.o. MRN: [id number]  CC: The primary encounter diagnosis was Scalp lesion. Diagnoses of Tobacco abuse counseling, Headache disorder, Generalized anxiety disorder, Essential hypertension, Cervical neck pain with evidence of disc disease, and Pure hypercholesterolemia were also pertinent to this visit.  HPI Sherry Petersen presents for  Follow up on hypertension,  Atherosclerosis , and insomnia .  Patient is NOT taking amlodipine since her last visit when it was suspended.  She has not been checking her blood pressures as recommended.    New onset recurrent headaches:  Has had 2 headaches that were preceded by vertigo,  visual   Aura (things moving that shouldn't be) , followed by sudden onset of nausea and vomiting.  The headaches lasted about  3 days each time, both in the last 2 months , occurred at work and at home.  Has also been having daily occipital headaches with no visual  Changes and no  nausea/dizziness  Tried increasing  her xanax on the nights of the bad headaches,  And ran out early .  Had some withdrawal symptoms that included nervousness and nausea.    Nonhealing  skin lesion on top of scalp present for 6 months  Stopped atorvastatin when rx ran out.  No side effects   Trying to quit smoking ,  Has cut back to 1/2 pack daily   Outpatient Medications Prior to Visit  Medication Sig Dispense Refill  . ALPRAZolam (XANAX) 1 MG tablet Take 1 tablet (1 mg total) by mouth at bedtime. 30 tablet 3  . cholecalciferol (VITAMIN D) 1000 UNITS tablet Take 1,000 Units by mouth daily.     Marland Kitchen linaclotide (LINZESS) 72 MCG capsule Take 1 capsule (72 mcg total) by mouth daily. 30 capsule 0  . ondansetron (ZOFRAN) 4 MG tablet Take 1 tablet (4 mg total) by mouth every 8 (eight) hours as needed for nausea or vomiting. 20 tablet 3  . albuterol (PROVENTIL HFA;VENTOLIN HFA) 108 (90 Base) MCG/ACT inhaler Inhale 2 puffs into the  lungs every 6 (six) hours as needed for wheezing or shortness of breath. (Patient not taking: Reported on 03/14/2018) 6.7 g 3  . Lactulose 20 GM/30ML SOLN 30 ml every 4 hours until constipation is relieved (Patient not taking: Reported on 07/28/2017) 236 mL 3   No facility-administered medications prior to visit.     Review of Systems;  Patient denies headache, fevers, malaise, unintentional weight loss, skin rash, eye pain, sinus congestion and sinus pain, sore throat, dysphagia,  hemoptysis , cough, dyspnea, wheezing, chest pain, palpitations, orthopnea, edema, abdominal pain, nausea, melena, diarrhea, constipation, flank pain, dysuria, hematuria, urinary  Frequency, nocturia, numbness, tingling, seizures,  Focal weakness, Loss of consciousness,  Tremor, insomnia, depression, anxiety, and suicidal ideation.      Objective:  BP (!) 144/88 (BP Location: Left Arm, Patient Position: Sitting, Cuff Size: Normal)   Pulse (!) 55   Temp 98.3 F (36.8 C)   Wt 102 lb 3.2 oz (46.4 kg)   SpO2 98%   BMI 18.69 kg/m   BP Readings from Last 3 Encounters:  03/14/18 (!) 144/88  11/02/17 130/78  07/28/17 130/78    Wt Readings from Last 3 Encounters:  03/14/18 102 lb 3.2 oz (46.4 kg)  11/02/17 101 lb 3.2 oz (45.9 kg)  07/28/17 97 lb 6.4 oz (44.2 kg)    General appearance: alert, cooperative and appears stated age Ears: normal TM's and  external ear canals both ears Throat: lips, mucosa, and tongue normal; teeth and gums normal Neck: no adenopathy, no carotid bruit, supple, symmetrical, trachea midline and thyroid not enlarged, symmetric, no tenderness/mass/nodules Back: symmetric, no curvature. ROM normal. No CVA tenderness. Lungs: clear to auscultation bilaterally Heart: regular rate and rhythm, S1, S2 normal, no murmur, click, rub or gallop Abdomen: soft, non-tender; bowel sounds normal; no masses,  no organomegaly Pulses: 2+ and symmetric Skin: Skin color, texture, turgor normal. No rashes or  lesions Lymph nodes: Cervical, supraclavicular, and axillary nodes normal.  Lab Results  Component Value Date   HGBA1C 6.4 03/14/2012    Lab Results  Component Value Date   CREATININE 0.73 11/02/2017   CREATININE 0.75 07/28/2017   CREATININE 0.65 08/11/2016    Lab Results  Component Value Date   WBC 6.2 12/21/2015   HGB 13.0 12/21/2015   HCT 41.9 08/29/2015   PLT 242 12/21/2015   GLUCOSE 85 11/02/2017   CHOL 183 11/02/2017   TRIG 51.0 11/02/2017   HDL 48.10 11/02/2017   LDLDIRECT 73 08/11/2016   LDLCALC 124 (H) 11/02/2017   ALT 13 11/02/2017   AST 21 11/02/2017   NA 144 11/02/2017   K 3.8 11/02/2017   CL 109 11/02/2017   CREATININE 0.73 11/02/2017   BUN 16 11/02/2017   CO2 25 11/02/2017   TSH 0.77 07/28/2017   HGBA1C 6.4 03/14/2012   MICROALBUR <0.7 08/29/2015    Mm Screening Breast Tomo Bilateral  Result Date: 09/01/2017 CLINICAL DATA:  Screening. EXAM: 2D DIGITAL SCREENING BILATERAL MAMMOGRAM WITH CAD AND ADJUNCT TOMO COMPARISON:  Previous exam(s). ACR Breast Density Category b: There are scattered areas of fibroglandular density. FINDINGS: There are no findings suspicious for malignancy. Images were processed with CAD. IMPRESSION: No mammographic evidence of malignancy. A result letter of this screening mammogram will be mailed directly to the patient. RECOMMENDATION: Screening mammogram in one year. (Code:[id number]-B-01Y) BI-RADS CATEGORY  1: Negative. Electronically Signed   By: Lillia Mountain M.D.   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  Current lesion on scalp needs biopsy,  Dermatology referral in process       Relevant Orders   Ambulatory referral to Dermatology   Hypertension    Resume amlodipine at 2.5 mg  daily      Relevant Medications   amLODipine (NORVASC) 2.5 MG tablet   atorvastatin (LIPITOR) 20 MG tablet   Hyperlipidemia    Based on current lipid profile, the risk of clinically significant Coronary artery disease is 11% over the next 10 years, using the Framingham risk calculator, because of her tobacco abuse.  Aortoiliac atherosclerosis was also noted on 2016  Renal CT.  Will recommend resuming atorvastatin   Lab Results  Component Value Date   CHOL 183 11/02/2017   HDL 48.10 11/02/2017   LDLCALC 124 (H) 11/02/2017   LDLDIRECT 73 08/11/2016   TRIG 51.0 11/02/2017   CHOLHDL 4 11/02/2017         Relevant Medications   amLODipine (NORVASC) 2.5 MG tablet   atorvastatin (LIPITOR) 20 MG tablet   Headache disorder    Suggestive of migraine.  Trial of nasal imitrex.  May need  MRI if frequency of 2/month continues, given long history of tobacco abuse. Up to date on other  CA screenings.       Relevant Medications   amLODipine (NORVASC) 2.5 MG tablet   SUMAtriptan (IMITREX) 5 MG/ACT nasal spray   Generalized anxiety disorder    Chief symptoms are anhedonia, irritability, and anorexia.  No suicidality.  Did not continue lexapro trial , but given recent evidence of alprazolam dependence,  Encourage reduction in dose and use of SSRI       Cervical neck pain with evidence of disc disease    Likely the cause of the daily occipital headaches.  Will rx muscle relaxer to discourage use of benzo for non anxiety issues          I am having Sherry Petersen start on amLODipine, atorvastatin, SUMAtriptan, and ondansetron. I am also having her maintain her cholecalciferol, ondansetron, Lactulose, albuterol, linaclotide, and ALPRAZolam.  Meds ordered this encounter  Medications  . amLODipine (NORVASC) 2.5 MG tablet    Sig: Take 1 tablet (2.5 mg total) by mouth daily.    Dispense:  90 tablet    Refill:  3  . atorvastatin (LIPITOR) 20 MG tablet    Sig: Take 1 tablet (20 mg total) by  mouth daily.    Dispense:  90 tablet    Refill:  3  . SUMAtriptan (IMITREX) 5 MG/ACT nasal spray    Sig: Place 1 spray (5 mg total) into the nose every 2 (two) hours as needed for migraine.    Dispense:  1 Inhaler    Refill:  0  . ondansetron (ZOFRAN-ODT) 4 MG disintegrating tablet    Sig: Take 1 tablet (4 mg total) by mouth every 8 (eight) hours as needed for nausea or vomiting.    Dispense:  20 tablet    Refill:  0   A total of 40 minutes was spent with patient more than half of which was spent in counseling patient on the above mentioned issues , reviewing and explaining recent labs and imaging studies done, and coordination of care. There are no discontinued medications.  Follow-up: Return in about 2 months (around 05/14/2018) for headaches,  hypertension,  .   Crecencio Mc, MD

## 2018-03-15 DIAGNOSIS — R519 Headache, unspecified: Secondary | ICD-10-CM | POA: Insufficient documentation

## 2018-03-15 DIAGNOSIS — R51 Headache: Secondary | ICD-10-CM

## 2018-03-15 DIAGNOSIS — L989 Disorder of the skin and subcutaneous tissue, unspecified: Secondary | ICD-10-CM | POA: Insufficient documentation

## 2018-03-15 NOTE — Assessment & Plan Note (Signed)
Based on current lipid profile, the risk of clinically significant Coronary artery disease is 11% over the next 10 years, using the Framingham risk calculator, because of her tobacco abuse.  Aortoiliac atherosclerosis was also noted on 2016  Renal CT.  Will recommend resuming atorvastatin   Lab Results  Component Value Date   CHOL 183 11/02/2017   HDL 48.10 11/02/2017   LDLCALC 124 (H) 11/02/2017   LDLDIRECT 73 08/11/2016   TRIG 51.0 11/02/2017   CHOLHDL 4 11/02/2017

## 2018-03-15 NOTE — Assessment & Plan Note (Signed)
Resume amlodipine at 2.5mg daily.

## 2018-03-15 NOTE — Assessment & Plan Note (Signed)
Suggestive of migraine.  Trial of nasal imitrex.  May need MRI if frequency of 2/month continues, given long history of tobacco abuse. Up to date on other  CA screenings.

## 2018-03-15 NOTE — Assessment & Plan Note (Signed)
Likely the cause of the daily occipital headaches.  Will rx muscle relaxer to discourage use of benzo for non anxiety issues

## 2018-03-15 NOTE — Assessment & Plan Note (Signed)
Attempts to reduce use supported with encourgement and review of past trials of medications

## 2018-03-15 NOTE — Assessment & Plan Note (Signed)
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  Current lesion on scalp needs biopsy,  Dermatology referral in process

## 2018-03-15 NOTE — Assessment & Plan Note (Signed)
Chief symptoms are anhedonia, irritability, and anorexia.  No suicidality.  Did not continue lexapro trial , but given recent evidence of alprazolam dependence,  Encourage reduction in dose and use of SSRI

## 2018-03-30 ENCOUNTER — Encounter: Payer: Self-pay | Admitting: Internal Medicine

## 2018-03-30 ENCOUNTER — Ambulatory Visit: Payer: Self-pay | Admitting: *Deleted

## 2018-03-30 ENCOUNTER — Ambulatory Visit
Admission: RE | Admit: 2018-03-30 | Discharge: 2018-03-30 | Disposition: A | Discharge status: Home / Self Care | Payer: 59 | Source: Ambulatory Visit | Attending: Internal Medicine | Admitting: Internal Medicine

## 2018-03-30 ENCOUNTER — Ambulatory Visit (INDEPENDENT_AMBULATORY_CARE_PROVIDER_SITE_OTHER): Payer: 59 | Admitting: Internal Medicine

## 2018-03-30 VITALS — BP 112/62 | HR 56 | Temp 98.2°F | Resp 14 | Ht 62.0 in | Wt 100.8 lb

## 2018-03-30 DIAGNOSIS — K529 Noninfective gastroenteritis and colitis, unspecified: Secondary | ICD-10-CM | POA: Diagnosis not present

## 2018-03-30 DIAGNOSIS — R310 Gross hematuria: Secondary | ICD-10-CM

## 2018-03-30 DIAGNOSIS — R300 Dysuria: Secondary | ICD-10-CM | POA: Diagnosis not present

## 2018-03-30 DIAGNOSIS — K37 Unspecified appendicitis: Secondary | ICD-10-CM | POA: Diagnosis not present

## 2018-03-30 DIAGNOSIS — I700 Atherosclerosis of aorta: Secondary | ICD-10-CM | POA: Diagnosis not present

## 2018-03-30 LAB — POCT URINALYSIS DIP (MANUAL ENTRY)
BILIRUBIN UA: NEGATIVE mg/dL
Bilirubin, UA: NEGATIVE
Glucose, UA: NEGATIVE mg/dL
Leukocytes, UA: NEGATIVE
Nitrite, UA: NEGATIVE
PROTEIN UA: NEGATIVE mg/dL
UROBILINOGEN UA: 0.2 U/dL
pH, UA: 5.5 (ref 5.0–8.0)

## 2018-03-30 MED ORDER — CIPROFLOXACIN HCL 500 MG PO TABS: 500.0000 mg | ORAL_TABLET | Freq: Two times a day (BID) | ORAL | 0 refills | Status: DC

## 2018-03-30 MED ORDER — METRONIDAZOLE 500 MG PO TABS: 500.0000 mg | ORAL_TABLET | Freq: Four times a day (QID) | ORAL | 0 refills | Status: DC

## 2018-03-30 NOTE — Telephone Encounter (Signed)
Appointment scheduled to see Dr Derrel Nip at 5:00 pm today

## 2018-03-30 NOTE — Progress Notes (Signed)
Subjective:  Patient ID: Sherry Petersen, female    DOB: Sep 10, 1961  Age: 57 y.o. MRN: [id number]  CC: The primary encounter diagnosis was Dysuria. A diagnosis of Colitis was also pertinent to this visit.  HPI Sherry Petersen presents for 4 day history of low back pain,  Low grad fevers  (100.1) ,  And rLQ pain of a cramping character,   Has has several days of noting blood when she wipes,  Has not had a BM in 5 days.  Chronic constipation ,  having nausea and vomiting.  Uses linzess intermittently (2/week) to achieve stooling bc daily use at the lowest dose causes uncontrollable diarrhea   Ct scan with contrast was done to ule out kidney stone saw only colitis   She is s/p hysterectomy   abd exam is nontender in RLQ and RUQ  Outpatient Medications Prior to Visit  Medication Sig Dispense Refill  . albuterol (PROVENTIL HFA;VENTOLIN HFA) 108 (90 Base) MCG/ACT inhaler Inhale 2 puffs into the lungs every 6 (six) hours as needed for wheezing or shortness of breath. 6.7 g 3  . ALPRAZolam (XANAX) 1 MG tablet Take 1 tablet (1 mg total) by mouth at bedtime. 30 tablet 3  . amLODipine (NORVASC) 2.5 MG tablet Take 1 tablet (2.5 mg total) by mouth daily. 90 tablet 3  . atorvastatin (LIPITOR) 20 MG tablet Take 1 tablet (20 mg total) by mouth daily. 90 tablet 3  . cholecalciferol (VITAMIN D) 1000 UNITS tablet Take 1,000 Units by mouth daily.     . Lactulose 20 GM/30ML SOLN 30 ml every 4 hours until constipation is relieved 236 mL 3  . linaclotide (LINZESS) 72 MCG capsule Take 1 capsule (72 mcg total) by mouth daily. 30 capsule 0  . ondansetron (ZOFRAN) 4 MG tablet Take 1 tablet (4 mg total) by mouth every 8 (eight) hours as needed for nausea or vomiting. 20 tablet 3  . ondansetron (ZOFRAN-ODT) 4 MG disintegrating tablet Take 1 tablet (4 mg total) by mouth every 8 (eight) hours as needed for nausea or vomiting. 20 tablet 0  . SUMAtriptan (IMITREX) 5 MG/ACT nasal spray Place 1 spray (5 mg total) into the  nose every 2 (two) hours as needed for migraine. 1 Inhaler 0   No facility-administered medications prior to visit.     Review of Systems;  Patient denies headache, fevers, malaise, unintentional weight loss, skin rash, eye pain, sinus congestion and sinus pain, sore throat, dysphagia,  hemoptysis , cough, dyspnea, wheezing, chest pain, palpitations, orthopnea, edema, , nausea, melena, diarrhea, constipation, flank pain, dysuria, urinary  Frequency, nocturia, numbness, tingling, seizures,  Focal weakness, Loss of consciousness,  Tremor, insomnia, depression, anxiety, and suicidal ideation.      Objective:  BP 112/62 (BP Location: Left Arm, Patient Position: Sitting, Cuff Size: Normal)   Pulse (!) 56   Temp 98.2 F (36.8 C) (Oral)   Resp 14   Ht 5\' 2"  (1.575 m)   Wt 100 lb 12.8 oz (45.7 kg)   SpO2 97%   BMI 18.44 kg/m   BP Readings from Last 3 Encounters:  03/30/18 112/62  03/14/18 (!) 144/88  11/02/17 130/78    Wt Readings from Last 3 Encounters:  03/30/18 100 lb 12.8 oz (45.7 kg)  03/14/18 102 lb 3.2 oz (46.4 kg)  11/02/17 101 lb 3.2 oz (45.9 kg)    General appearance: alert, cooperative and appears stated age Ears: normal TM's and external ear canals both ears Throat: lips, mucosa, and  tongue normal; teeth and gums normal Neck: no adenopathy, no carotid bruit, supple, symmetrical, trachea midline and thyroid not enlarged, symmetric, no tenderness/mass/nodules Back: symmetric, no curvature. ROM normal. No CVA tenderness. Lungs: clear to auscultation bilaterally Heart: regular rate and rhythm, S1, S2 normal, no murmur, click, rub or gallop Abdomen: soft, non-tender; bowel sounds normal; no masses,  no organomegaly Pulses: 2+ and symmetric Skin: Skin color, texture, turgor normal. No rashes or lesions Lymph nodes: Cervical, supraclavicular, and axillary nodes normal.  Lab Results  Component Value Date   HGBA1C 6.4 03/14/2012    Lab Results  Component Value Date    CREATININE 0.73 11/02/2017   CREATININE 0.75 07/28/2017   CREATININE 0.65 08/11/2016    Lab Results  Component Value Date   WBC 6.2 12/21/2015   HGB 13.0 12/21/2015   HCT 41.9 08/29/2015   PLT 242 12/21/2015   GLUCOSE 85 11/02/2017   CHOL 183 11/02/2017   TRIG 51.0 11/02/2017   HDL 48.10 11/02/2017   LDLDIRECT 73 08/11/2016   LDLCALC 124 (H) 11/02/2017   ALT 13 11/02/2017   AST 21 11/02/2017   NA 144 11/02/2017   K 3.8 11/02/2017   CL 109 11/02/2017   CREATININE 0.73 11/02/2017   BUN 16 11/02/2017   CO2 25 11/02/2017   TSH 0.77 07/28/2017   HGBA1C 6.4 03/14/2012   MICROALBUR <0.7 08/29/2015    Ct Renal Stone Study  Result Date: 03/30/2018 CLINICAL DATA:  Gross hematuria. EXAM: CT ABDOMEN AND PELVIS WITHOUT CONTRAST TECHNIQUE: Multidetector CT imaging of the abdomen and pelvis was performed following the standard protocol without IV contrast. COMPARISON:  CT scan of September 02, 2015. FINDINGS: Lower chest: Mild left basilar subsegmental atelectasis is noted. Hepatobiliary: No gallstones are noted. Stable small left hepatic cyst. No other abnormality seen in the liver. No biliary dilatation is noted. Pancreas: Unremarkable. No pancreatic ductal dilatation or surrounding inflammatory changes. Spleen: Normal in size without focal abnormality. Adrenals/Urinary Tract: Adrenal glands are unremarkable. Kidneys are normal, without renal calculi, focal lesion, or hydronephrosis. Bladder is unremarkable. Stomach/Bowel: The stomach and appendix appear normal. No bowel dilatation is noted. Wall thickening of the cecum is noted with mild surrounding inflammation suggesting mild colitis. Vascular/Lymphatic: Aortic atherosclerosis. No enlarged abdominal or pelvic lymph nodes. Reproductive: Status post hysterectomy. No adnexal masses. Other: No abdominal wall hernia or abnormality. No abdominopelvic ascites. Musculoskeletal: No acute or significant osseous findings. IMPRESSION: Mild wall thickening  of the cecum with mild surrounding inflammation is noted suggesting mild infectious or inflammatory colitis. No hydronephrosis or renal obstruction is noted. No renal or ureteral calculi are noted. Aortic Atherosclerosis (ICD10-I70.0). Electronically Signed   By: Marijo Conception, M.D.   On: 03/30/2018 14:55    Assessment & Plan:   Problem List Items Addressed This Visit    Colitis    Presumed infectious  Noted on CT renal stone done to evaluate back pain and hematuria.  empiric cipro and flagyl , along with miralax for management of chronic constipation       Relevant Orders   Fecal occult blood, imunochemical    Other Visit Diagnoses    Dysuria    -  Primary   Relevant Orders   POCT urinalysis dipstick (Completed)   Urine Culture (Completed)      I am having Sherry Petersen start on ciprofloxacin and metroNIDAZOLE. I am also having her maintain her cholecalciferol, ondansetron, Lactulose, albuterol, linaclotide, ALPRAZolam, amLODipine, atorvastatin, SUMAtriptan, and ondansetron.  Meds ordered this encounter  Medications  . ciprofloxacin (CIPRO) 500 MG tablet    Sig: Take 1 tablet (500 mg total) by mouth 2 (two) times daily.    Dispense:  7 tablet    Refill:  0  . metroNIDAZOLE (FLAGYL) 500 MG tablet    Sig: Take 1 tablet (500 mg total) by mouth 4 (four) times daily.    Dispense:  28 tablet    Refill:  0    There are no discontinued medications.  Follow-up: No follow-ups on file.   Crecencio Mc, MD

## 2018-03-30 NOTE — Telephone Encounter (Signed)
Pt complains of pink blood in her urine with back pain and stomach pressure she has been bleeding for 3 days now it started on Saturday and so far she think it stopped today patient think she may have a kidney infection please call pt at her job number  Reason for Disposition . Side (flank) or lower back pain present  Answer Assessment - Initial Assessment Questions 1. SYMPTOM: "What's the main symptom you're concerned about?" (e.g., frequency, incontinence)     Blood in urine-pink in toilet for last 3 days 2. ONSET: "When did the  ________  start?"     Started Saturday night 3. PAIN: "Is there any pain?" If so, ask: "How bad is it?" (Scale: 1-10; mild, moderate, severe)     Yes- located in back and stomach - 5-6 4. CAUSE: "What do you think is causing the symptoms?"     Possible kidney infection 5. OTHER SYMPTOMS: "Do you have any other symptoms?" (e.g., fever, flank pain, blood in urine, pain with urination)     Fever- 100.1, back pain, nausea 6. PREGNANCY: "Is there any chance you are pregnant?" "When was your last menstrual period?"     n/a  Protocols used: URINARY [facility]

## 2018-03-30 NOTE — Progress Notes (Signed)
ipoct

## 2018-03-30 NOTE — Telephone Encounter (Signed)
THIS DOESN'T SOUND LIKE A RUN OF THE MILL UTI.   FLANK PAI AND BLOOD IN URINE COULD BE A STONE  WHICH I CANNOT DIAGNOSE AT 5 PM ,  SHE NEEDS A STAT CT RENAL STONE PRIOR TO HER VISIT   I HAVE ORDERED IT,

## 2018-03-30 NOTE — Addendum Note (Signed)
Addended by: Crecencio Mc on: 03/30/2018 11:38 AM   Modules accepted: Orders

## 2018-03-30 NOTE — Patient Instructions (Addendum)
Your CT scan suggested either infectious or inflammatory colitis.  No stones or kidney inflammation. UA was also not suggestive of infection,  But I will run a urine culture to be sure  I would like to treat you with Cipro/Flagyl and have you test your stool for blood using the cards we have given you today  Use lactulose or miralax to resolve your constipation   Simplify your diet for a few days  If your pain gets worse or you continue to have fevers,  please go to the ER  bc it might be appendicitis and the CT was not designed to look for that

## 2018-03-31 LAB — URINE CULTURE
MICRO NUMBER:: 90473093
Result:: NO GROWTH
SPECIMEN QUALITY:: ADEQUATE

## 2018-03-31 NOTE — Telephone Encounter (Signed)
Pt was in the office yesterday for an appt with Dr. Derrel Nip

## 2018-04-01 DIAGNOSIS — K529 Noninfective gastroenteritis and colitis, unspecified: Secondary | ICD-10-CM | POA: Insufficient documentation

## 2018-04-01 NOTE — Assessment & Plan Note (Signed)
Presumed infectious  Noted on CT renal stone done to evaluate back pain and hematuria.  empiric cipro and flagyl , along with miralax for management of chronic constipation

## 2018-04-06 ENCOUNTER — Other Ambulatory Visit: Payer: Self-pay | Admitting: Internal Medicine

## 2018-04-06 MED ORDER — LINACLOTIDE 72 MCG PO CAPS: 72.0000 ug | ORAL_CAPSULE | Freq: Every day | ORAL | 2 refills | Status: DC

## 2018-05-16 ENCOUNTER — Ambulatory Visit (INDEPENDENT_AMBULATORY_CARE_PROVIDER_SITE_OTHER): Payer: 59 | Admitting: Internal Medicine

## 2018-05-16 ENCOUNTER — Encounter: Payer: Self-pay | Admitting: Internal Medicine

## 2018-05-16 VITALS — BP 126/74 | HR 65 | Temp 98.3°F | Resp 15 | Ht 62.0 in | Wt 102.4 lb

## 2018-05-16 DIAGNOSIS — K529 Noninfective gastroenteritis and colitis, unspecified: Secondary | ICD-10-CM

## 2018-05-16 DIAGNOSIS — J069 Acute upper respiratory infection, unspecified: Secondary | ICD-10-CM

## 2018-05-16 DIAGNOSIS — F411 Generalized anxiety disorder: Secondary | ICD-10-CM | POA: Diagnosis not present

## 2018-05-16 DIAGNOSIS — R51 Headache: Secondary | ICD-10-CM | POA: Diagnosis not present

## 2018-05-16 DIAGNOSIS — L989 Disorder of the skin and subcutaneous tissue, unspecified: Secondary | ICD-10-CM

## 2018-05-16 DIAGNOSIS — R519 Headache, unspecified: Secondary | ICD-10-CM

## 2018-05-16 MED ORDER — ALPRAZOLAM 0.5 MG PO TABS: 0.7500 mg | ORAL_TABLET | Freq: Every day | ORAL | 0 refills | Status: DC

## 2018-05-16 MED ORDER — ALPRAZOLAM 0.5 MG PO TABS: 0.5000 mg | ORAL_TABLET | Freq: Every day | ORAL | 0 refills | Status: DC

## 2018-05-16 MED ORDER — ONDANSETRON 4 MG PO TBDP: 4.0000 mg | ORAL_TABLET | Freq: Three times a day (TID) | ORAL | 3 refills | Status: DC | PRN

## 2018-05-16 MED ORDER — BUSPIRONE HCL 5 MG PO TABS: 5.0000 mg | ORAL_TABLET | Freq: Three times a day (TID) | ORAL | 2 refills | Status: DC

## 2018-05-16 MED ORDER — ALPRAZOLAM 0.5 MG PO TABS
0.2500 mg | ORAL_TABLET | Freq: Every day | ORAL | 0 refills | Status: DC
Start: 2018-05-16 — End: 2018-09-26

## 2018-05-16 NOTE — Patient Instructions (Addendum)
Your have a viral infection  (thank your  Grandson)    Try  Using Sudafed PE  FOR THE EAR AND SINUS CONGESTION   EVERY 6 HOURS .  aDD Benadryl for nighttime post nAsal drip  Continue Zuyrtec or Claritin for the  daily allergy symptoms    To get off the alprazolam:  Reduce alprazolam dose by 0.25 mg every MONTH  0.75 mg daily for the first month  0.50 mg daily for the second month  0.25 mg daily for the third month  RTC during Month 3

## 2018-05-16 NOTE — Progress Notes (Signed)
Subjective:  Patient ID: Sherry Petersen, female    DOB: [birth year]  Age: 57 y.o. MRN: [id number]  CC: The primary encounter diagnosis was Skin lesion of scalp. Diagnoses of Anxiety state, Viral URI, Generalized anxiety disorder, Colitis, Headache disorder, and Scalp lesion were also pertinent to this visit.  HPI [name] presents for follow up on recurrent headaches, GAD and skin changes.   Headaces:  Occurring 2/month  History was suggestive of migraines and imitrex trial started in April  Has been tolerated and effective.  Not occurring more than 2/month   Viral  URI: symptoms of sore throat,  Rhinitis started several days ago after contact with sick 25 yr old grandson.   Declines prednisone, using otc meds,  No wheezing,  Fevers, or facial pain .   Scalp lesion: noted in April,  Dermatology referral referred until now, Patient requesting referral to  [facility] at [facility] on [facility]    Dr.  Lillia Carmel   GAD:  Chronically managed with alprazolam due to recurrent intolerance of SSRI trials.  Feels she needs to stop the alprazolam. Feels ADDICTED.. Reviewed prior SSRI trials of may,  Did not tolerate htem, . Wiling to retry  Buspirone    Outpatient Medications Prior to Visit  Medication Sig Dispense Refill  . albuterol (PROVENTIL HFA;VENTOLIN HFA) 108 (90 Base) MCG/ACT inhaler Inhale 2 puffs into the lungs every 6 (six) hours as needed for wheezing or shortness of breath. 6.7 g 3  . amLODipine (NORVASC) 2.5 MG tablet Take 1 tablet (2.5 mg total) by mouth daily. 90 tablet 3  . atorvastatin (LIPITOR) 20 MG tablet Take 1 tablet (20 mg total) by mouth daily. 90 tablet 3  . cholecalciferol (VITAMIN D) 1000 UNITS tablet Take 1,000 Units by mouth daily.     Marland Kitchen linaclotide (LINZESS) 72 MCG capsule Take 1 capsule (72 mcg total) by mouth daily. 30 capsule 2  . SUMAtriptan (IMITREX) 5 MG/ACT nasal spray Place 1 spray (5 mg total) into the nose every 2 (two) hours  as needed for migraine. 1 Inhaler 0  . ALPRAZolam (XANAX) 1 MG tablet Take 1 tablet (1 mg total) by mouth at bedtime. 30 tablet 3  . ondansetron (ZOFRAN-ODT) 4 MG disintegrating tablet Take 1 tablet (4 mg total) by mouth every 8 (eight) hours as needed for nausea or vomiting. 20 tablet 0  . ciprofloxacin (CIPRO) 500 MG tablet Take 1 tablet (500 mg total) by mouth 2 (two) times daily. (Patient not taking: Reported on 05/16/2018) 7 tablet 0  . Lactulose 20 GM/30ML SOLN 30 ml every 4 hours until constipation is relieved (Patient not taking: Reported on 05/16/2018) 236 mL 3  . metroNIDAZOLE (FLAGYL) 500 MG tablet Take 1 tablet (500 mg total) by mouth 4 (four) times daily. (Patient not taking: Reported on 05/16/2018) 28 tablet 0  . ondansetron (ZOFRAN) 4 MG tablet Take 1 tablet (4 mg total) by mouth every 8 (eight) hours as needed for nausea or vomiting. (Patient not taking: Reported on 05/16/2018) 20 tablet 3   No facility-administered medications prior to visit.     Review of Systems;  Patient denies headache, fevers, malaise, unintentional weight loss, skin rash, eye pain, sinus pain, , dysphagia,  hemoptysis ,, dyspnea, wheezing, chest pain, palpitations, orthopnea, edema, abdominal pain, nausea, melena, diarrhea, constipation, flank pain, dysuria, hematuria, urinary  Frequency, nocturia, numbness, tingling, seizures,  Focal weakness, Loss of consciousness,  Tremor, insomnia, depression, anxiety, and suicidal ideation.  Objective:  BP 126/74 (BP Location: Left Arm, Patient Position: Sitting, Cuff Size: Normal)   Pulse 65   Temp 98.3 F (36.8 C) (Oral)   Resp 15   Ht 5\' 2"  (1.575 m)   Wt 102 lb 6.4 oz (46.4 kg)   SpO2 97%   BMI 18.73 kg/m   BP Readings from Last 3 Encounters:  05/16/18 126/74  03/30/18 112/62  03/14/18 (!) 144/88    Wt Readings from Last 3 Encounters:  05/16/18 102 lb 6.4 oz (46.4 kg)  03/30/18 100 lb 12.8 oz (45.7 kg)  03/14/18 102 lb 3.2 oz (46.4 kg)    General  appearance: alert, cooperative and appears stated age Ears: normal TM's and external ear canals both ears Throat: lips, mucosa, and tongue normal; teeth and gums normal Neck: no adenopathy, no carotid bruit, supple, symmetrical, trachea midline and thyroid not enlarged, symmetric, no tenderness/mass/nodules Back: symmetric, no curvature. ROM normal. No CVA tenderness. Lungs: clear to auscultation bilaterally Heart: regular rate and rhythm, S1, S2 normal, no murmur, click, rub or gallop Abdomen: soft, non-tender; bowel sounds normal; no masses,  no organomegaly Pulses: 2+ and symmetric Skin:  Scalp lesion midline top of head covered in crusting /scabbed. . No rashes \ Lymph nodes: Cervical, supraclavicular, and axillary nodes normal.  Lab Results  Component Value Date   HGBA1C 6.4 03/14/2012    Lab Results  Component Value Date   CREATININE 0.73 11/02/2017   CREATININE 0.75 07/28/2017   CREATININE 0.65 08/11/2016    Lab Results  Component Value Date   WBC 6.2 12/21/2015   HGB 13.0 12/21/2015   HCT 41.9 08/29/2015   PLT 242 12/21/2015   GLUCOSE 85 11/02/2017   CHOL 183 11/02/2017   TRIG 51.0 11/02/2017   HDL 48.10 11/02/2017   LDLDIRECT 73 08/11/2016   LDLCALC 124 (H) 11/02/2017   ALT 13 11/02/2017   AST 21 11/02/2017   NA 144 11/02/2017   K 3.8 11/02/2017   CL 109 11/02/2017   CREATININE 0.73 11/02/2017   BUN 16 11/02/2017   CO2 25 11/02/2017   TSH 0.77 07/28/2017   HGBA1C 6.4 03/14/2012   MICROALBUR <0.7 08/29/2015    Ct Renal Stone Study  Result Date: 03/30/2018 CLINICAL DATA:  Gross hematuria. EXAM: CT ABDOMEN AND PELVIS WITHOUT CONTRAST TECHNIQUE: Multidetector CT imaging of the abdomen and pelvis was performed following the standard protocol without IV contrast. COMPARISON:  CT scan of September 02, 2015. FINDINGS: Lower chest: Mild left basilar subsegmental atelectasis is noted. Hepatobiliary: No gallstones are noted. Stable small left hepatic cyst. No other  abnormality seen in the liver. No biliary dilatation is noted. Pancreas: Unremarkable. No pancreatic ductal dilatation or surrounding inflammatory changes. Spleen: Normal in size without focal abnormality. Adrenals/Urinary Tract: Adrenal glands are unremarkable. Kidneys are normal, without renal calculi, focal lesion, or hydronephrosis. Bladder is unremarkable. Stomach/Bowel: The stomach and appendix appear normal. No bowel dilatation is noted. Wall thickening of the cecum is noted with mild surrounding inflammation suggesting mild colitis. Vascular/Lymphatic: Aortic atherosclerosis. No enlarged abdominal or pelvic lymph nodes. Reproductive: Status post hysterectomy. No adnexal masses. Other: No abdominal wall hernia or abnormality. No abdominopelvic ascites. Musculoskeletal: No acute or significant osseous findings. IMPRESSION: Mild wall thickening of the cecum with mild surrounding inflammation is noted suggesting mild infectious or inflammatory colitis. No hydronephrosis or renal obstruction is noted. No renal or ureteral calculi are noted. Aortic Atherosclerosis (ICD10-I70.0). Electronically Signed   By: Marijo Conception, M.D.   On: 03/30/2018  14:55    Assessment & Plan:   Problem List Items Addressed This Visit    Viral URI    Symptomatic supportive care outlined.       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  Current lesion on scalp needs biopsy,  Dermatology referral in process to [facility] Dr Dianah Field in [place]       Headache disorder    Migraines occurring at a frequency of 2 or less per month,  Continue prn imitrex.       Generalized anxiety disorder    With chronic benzo use now wanting to wean off due to dependence.  Outlined gradual wean b 0.25 mg per month.  New rx for each month given starting with reduction to 0.75 mg daily for month 1, 0.5 mg daily for month 2,  And 0.25 mg daily for month 3,  rtc 3 months. Repeat trial of buspirone recommended         Relevant Medications   ALPRAZolam (XANAX) 0.5 MG tablet   busPIRone (BUSPAR) 5 MG tablet   ALPRAZolam (XANAX) 0.5 MG tablet   Colitis    Infectious vs inflammatory changes , mild noted on April 2019 CT.  Completed empiric cipro/flagyl ,  Symptoms resolved.        Other Visit Diagnoses    Skin lesion of scalp    -  Primary   Relevant Orders   Ambulatory referral to Dermatology   Anxiety state       Relevant Medications   ALPRAZolam (XANAX) 0.5 MG tablet   busPIRone (BUSPAR) 5 MG tablet   ALPRAZolam (XANAX) 0.5 MG tablet      I have discontinued Maylyn C. Guilford's Lactulose, ALPRAZolam, ciprofloxacin, metroNIDAZOLE, and ALPRAZolam. I have also changed her ALPRAZolam and ALPRAZolam. Additionally, I am having her maintain her cholecalciferol, albuterol, amLODipine, atorvastatin, SUMAtriptan, linaclotide, ondansetron, and busPIRone.  Meds ordered this encounter  Medications  . ondansetron (ZOFRAN-ODT) 4 MG disintegrating tablet    Sig: Take 1 tablet (4 mg total) by mouth every 8 (eight) hours as needed for nausea or vomiting.    Dispense:  20 tablet    Refill:  3  . DISCONTD: ALPRAZolam (XANAX) 0.5 MG tablet    Sig: Take 1.5 tablets (0.75 mg total) by mouth at bedtime.    Dispense:  45 tablet    Refill:  0  . DISCONTD: ALPRAZolam (XANAX) 0.5 MG tablet    Sig: Take 1.5 tablets (0.75 mg total) by mouth at bedtime.    Dispense:  30 tablet    Refill:  0    May refill on or after June 15 2018  . ALPRAZolam (XANAX) 0.5 MG tablet    Sig: Take 0.5 tablets (0.25 mg total) by mouth at bedtime.    Dispense:  30 tablet    Refill:  0    May refill on or after July 15 2018  . busPIRone (BUSPAR) 5 MG tablet    Sig: Take 1 tablet (5 mg total) by mouth 3 (three) times daily.    Dispense:  90 tablet    Refill:  2  . ALPRAZolam (XANAX) 0.5 MG tablet    Sig: Take 1 tablet (0.5 mg total) by mouth at bedtime.    Dispense:  30 tablet    Refill:  0    May refill on or after June 15 2018     Medications Discontinued During This Encounter  Medication Reason  .  ciprofloxacin (CIPRO) 500 MG tablet Completed Course  . Lactulose 20 GM/30ML SOLN Patient has not taken in last 30 days  . metroNIDAZOLE (FLAGYL) 500 MG tablet Completed Course  . ondansetron (ZOFRAN) 4 MG tablet Patient has not taken in last 30 days  . ondansetron (ZOFRAN-ODT) 4 MG disintegrating tablet Reorder  . ALPRAZolam (XANAX) 1 MG tablet   . ALPRAZolam (XANAX) 0.5 MG tablet   . ALPRAZolam (XANAX) 0.5 MG tablet     Follow-up: Return in about 3 months (around 08/16/2018) for anxiety,  alprazolam wean .   Crecencio Mc, MD

## 2018-05-17 DIAGNOSIS — J069 Acute upper respiratory infection, unspecified: Secondary | ICD-10-CM | POA: Insufficient documentation

## 2018-05-17 NOTE — Assessment & Plan Note (Signed)
Symptomatic supportive care outlined.

## 2018-05-17 NOTE — Assessment & Plan Note (Signed)
Migraines occurring at a frequency of 2 or less per month,  Continue prn imitrex.

## 2018-05-17 NOTE — Assessment & Plan Note (Signed)
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  Current lesion on scalp needs biopsy,  Dermatology referral in process to [facility] Dr Dianah Field in [place]

## 2018-05-17 NOTE — Assessment & Plan Note (Addendum)
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  Outlined gradual wean b 0.25 mg per month.  New rx for each month given starting with reduction to 0.75 mg daily for month 1, 0.5 mg daily for month 2,  And 0.25 mg daily for month 3,  rtc 3 months. Repeat trial of buspirone recommended

## 2018-05-17 NOTE — Assessment & Plan Note (Signed)
Infectious vs inflammatory changes , mild noted on April 2019 CT.  Completed empiric cipro/flagyl ,  Symptoms resolved.

## 2018-05-24 ENCOUNTER — Telehealth: Payer: Self-pay | Admitting: *Deleted

## 2018-05-24 ENCOUNTER — Encounter: Payer: Self-pay | Admitting: Internal Medicine

## 2018-05-24 MED ORDER — BENZONATATE 200 MG PO CAPS: 200.0000 mg | ORAL_CAPSULE | Freq: Three times a day (TID) | ORAL | 1 refills | Status: DC | PRN

## 2018-05-24 NOTE — Telephone Encounter (Signed)
Patient notified

## 2018-05-24 NOTE — Telephone Encounter (Signed)
Patient says she saw you last Monday and stated she had a viral infection, patient stated now she has a cough and is wondering if you would prescribe some cough medication you had mentioned at office visit.

## 2018-05-24 NOTE — Telephone Encounter (Signed)
benzonotate capsules for Cough suppression  Sent to [facility]

## 2018-06-07 ENCOUNTER — Encounter: Payer: Self-pay | Admitting: Internal Medicine

## 2018-06-08 ENCOUNTER — Other Ambulatory Visit: Payer: Self-pay | Admitting: Internal Medicine

## 2018-06-08 DIAGNOSIS — F411 Generalized anxiety disorder: Secondary | ICD-10-CM

## 2018-08-17 ENCOUNTER — Encounter: Payer: Self-pay | Admitting: Internal Medicine

## 2018-08-17 ENCOUNTER — Ambulatory Visit (INDEPENDENT_AMBULATORY_CARE_PROVIDER_SITE_OTHER): Payer: 59 | Admitting: Internal Medicine

## 2018-08-17 VITALS — BP 128/78 | HR 57 | Temp 98.5°F | Resp 15 | Ht 62.0 in | Wt 101.4 lb

## 2018-08-17 DIAGNOSIS — J029 Acute pharyngitis, unspecified: Secondary | ICD-10-CM | POA: Diagnosis not present

## 2018-08-17 LAB — POCT RAPID STREP A (OFFICE): Rapid Strep A Screen: NEGATIVE

## 2018-08-17 MED ORDER — HYDROCOD POLST-CPM POLST ER 10-8 MG/5ML PO SUER: 5.0000 mL | Freq: Every evening | ORAL | 0 refills | Status: DC | PRN

## 2018-08-17 MED ORDER — LEVOFLOXACIN 500 MG PO TABS: 500.0000 mg | ORAL_TABLET | Freq: Every day | ORAL | 0 refills | Status: DC

## 2018-08-17 NOTE — Patient Instructions (Signed)
I am treating you for bacterial sinusitis which is probably a  complication from your allergies causing   persistent sinus congestion.   I am prescribing an antibiotic (levaquin)   To manage the infection and the inflammation in your ear/sinuses.   I also advise use of the following OTC meds to help with your other symptoms.   Take generic OTC benadryl 25 mg at bedtime  for the drainage,  Sudafed PE 20 mg every 6 hours  if needed for congestion,   flush your sinuses twice daily with NeilMed sinus rinse  (do over the sink because if you do it right you will spit out globs of mucus)  Use Delsym for daytime cough and Tussionex for the nighttime  Cough . Remember that it has Vicodin in it so do not combine with alcohol or operate a car!    Please take a probiotic ( generic version of Align, Flora que or Culturelle) OR eat a serving of good quality yogurt daily  for three weeks since you are taking an  antibiotic to prevent a very serious antibiotic associated infection  Called clostridium dificile colitis that can cause diarrhea, multi organ failure, sepsis and death if not managed.

## 2018-08-17 NOTE — Progress Notes (Signed)
Subjective:  Patient ID: Sherry Petersen, female    DOB: 21-Nov-1961  Age: 57 y.o. MRN: [id number]  CC: The encounter diagnosis was Sore throat.  HPI Sherry Petersen presents for evaluation of sore throat.  Symptom Has been present for two weeks accompanied by mild ear pain and sinus congestion,  And Cough productive of green sputum.  No body aches.  Noticed  A white spot on throat recently.  Still smoking , works in a lab.  No sick contacts.   Outpatient Medications Prior to Visit  Medication Sig Dispense Refill  . albuterol (PROVENTIL HFA;VENTOLIN HFA) 108 (90 Base) MCG/ACT inhaler Inhale 2 puffs into the lungs every 6 (six) hours as needed for wheezing or shortness of breath. 6.7 g 3  . ALPRAZolam (XANAX) 0.5 MG tablet Take 0.5 tablets (0.25 mg total) by mouth at bedtime. 30 tablet 0  . amLODipine (NORVASC) 2.5 MG tablet Take 1 tablet (2.5 mg total) by mouth daily. 90 tablet 3  . atorvastatin (LIPITOR) 20 MG tablet Take 1 tablet (20 mg total) by mouth daily. 90 tablet 3  . busPIRone (BUSPAR) 5 MG tablet TAKE 1 TABLET BY MOUTH THREE TIMES A DAY 270 tablet 1  . cholecalciferol (VITAMIN D) 1000 UNITS tablet Take 1,000 Units by mouth daily.     Marland Kitchen linaclotide (LINZESS) 72 MCG capsule Take 1 capsule (72 mcg total) by mouth daily. 30 capsule 2  . ondansetron (ZOFRAN-ODT) 4 MG disintegrating tablet Take 1 tablet (4 mg total) by mouth every 8 (eight) hours as needed for nausea or vomiting. 20 tablet 3  . SUMAtriptan (IMITREX) 5 MG/ACT nasal spray Place 1 spray (5 mg total) into the nose every 2 (two) hours as needed for migraine. 1 Inhaler 0  . ALPRAZolam (XANAX) 0.5 MG tablet Take 1 tablet (0.5 mg total) by mouth at bedtime. (Patient not taking: Reported on 08/17/2018) 30 tablet 0  . benzonatate (TESSALON) 200 MG capsule Take 1 capsule (200 mg total) by mouth 3 (three) times daily as needed for cough. (Patient not taking: Reported on 08/17/2018) 60 capsule 1   No facility-administered medications prior  to visit.     Review of Systems;  Patient denies headache, fevers, malaise, unintentional weight loss, skin rash, eye pain,  dysphagia,  hemoptysis , dyspnea, wheezing, chest pain, palpitations, orthopnea, edema, abdominal pain, nausea, melena, diarrhea, constipation, flank pain, dysuria, hematuria, urinary  Frequency, nocturia, numbness, tingling, seizures,  Focal weakness, Loss of consciousness,  Tremor, insomnia, depression, anxiety, and suicidal ideation.      Objective:  BP 128/78 (BP Location: Left Arm, Patient Position: Sitting, Cuff Size: Normal)   Pulse (!) 57   Temp 98.5 F (36.9 C) (Oral)   Resp 15   Ht 5\' 2"  (1.575 m)   Wt 101 lb 6.4 oz (46 kg)   SpO2 96%   BMI 18.55 kg/m   BP Readings from Last 3 Encounters:  08/17/18 128/78  05/16/18 126/74  03/30/18 112/62    Wt Readings from Last 3 Encounters:  08/17/18 101 lb 6.4 oz (46 kg)  05/16/18 102 lb 6.4 oz (46.4 kg)  03/30/18 100 lb 12.8 oz (45.7 kg)    General appearance: alert, cooperative and appears stated age Ears: dull bilateral  TM's and external ear canals both ears Throat: erythematous tonsillar pillars without ulcerations.,mucosa, and tongue normal; teeth and gums normal Neck: no adenopathy, no carotid bruit, supple, symmetrical, trachea midline and thyroid not enlarged, symmetric, no tenderness/mass/nodules Back: symmetric, no curvature. ROM normal.  No CVA tenderness. Lungs: clear to auscultation bilaterally Heart: regular rate and rhythm, S1, S2 normal, no murmur, click, rub or gallop Abdomen: soft, non-tender; bowel sounds normal; no masses,  no organomegaly Pulses: 2+ and symmetric Skin: Skin color, texture, turgor normal. No rashes or lesions Lymph nodes: Cervical, LAD; supraclavicular, and axillary nodes normal.  Lab Results  Component Value Date   HGBA1C 6.4 03/14/2012    Lab Results  Component Value Date   CREATININE 0.73 11/02/2017   CREATININE 0.75 07/28/2017   CREATININE 0.65  08/11/2016    Lab Results  Component Value Date   WBC 6.2 12/21/2015   HGB 13.0 12/21/2015   HCT 41.9 08/29/2015   PLT 242 12/21/2015   GLUCOSE 85 11/02/2017   CHOL 183 11/02/2017   TRIG 51.0 11/02/2017   HDL 48.10 11/02/2017   LDLDIRECT 73 08/11/2016   LDLCALC 124 (H) 11/02/2017   ALT 13 11/02/2017   AST 21 11/02/2017   NA 144 11/02/2017   K 3.8 11/02/2017   CL 109 11/02/2017   CREATININE 0.73 11/02/2017   BUN 16 11/02/2017   CO2 25 11/02/2017   TSH 0.77 07/28/2017   HGBA1C 6.4 03/14/2012   MICROALBUR <0.7 08/29/2015    Ct Renal Stone Study  Result Date: 03/30/2018 CLINICAL DATA:  Gross hematuria. EXAM: CT ABDOMEN AND PELVIS WITHOUT CONTRAST TECHNIQUE: Multidetector CT imaging of the abdomen and pelvis was performed following the standard protocol without IV contrast. COMPARISON:  CT scan of September 02, 2015. FINDINGS: Lower chest: Mild left basilar subsegmental atelectasis is noted. Hepatobiliary: No gallstones are noted. Stable small left hepatic cyst. No other abnormality seen in the liver. No biliary dilatation is noted. Pancreas: Unremarkable. No pancreatic ductal dilatation or surrounding inflammatory changes. Spleen: Normal in size without focal abnormality. Adrenals/Urinary Tract: Adrenal glands are unremarkable. Kidneys are normal, without renal calculi, focal lesion, or hydronephrosis. Bladder is unremarkable. Stomach/Bowel: The stomach and appendix appear normal. No bowel dilatation is noted. Wall thickening of the cecum is noted with mild surrounding inflammation suggesting mild colitis. Vascular/Lymphatic: Aortic atherosclerosis. No enlarged abdominal or pelvic lymph nodes. Reproductive: Status post hysterectomy. No adnexal masses. Other: No abdominal wall hernia or abnormality. No abdominopelvic ascites. Musculoskeletal: No acute or significant osseous findings. IMPRESSION: Mild wall thickening of the cecum with mild surrounding inflammation is noted suggesting mild  infectious or inflammatory colitis. No hydronephrosis or renal obstruction is noted. No renal or ureteral calculi are noted. Aortic Atherosclerosis (ICD10-I70.0). Electronically Signed   By: Marijo Conception, M.D.   On: 03/30/2018 14:55    Assessment & Plan:   Problem List Items Addressed This Visit    None    Visit Diagnoses    Sore throat    -  Primary   Relevant Orders   POCT rapid strep A (Completed)      I have discontinued Ludell C. Colley's benzonatate. I am also having her start on levofloxacin and chlorpheniramine-HYDROcodone. Additionally, I am having her maintain her cholecalciferol, albuterol, amLODipine, atorvastatin, SUMAtriptan, linaclotide, ondansetron, ALPRAZolam, and busPIRone.  Meds ordered this encounter  Medications  . levofloxacin (LEVAQUIN) 500 MG tablet    Sig: Take 1 tablet (500 mg total) by mouth daily.    Dispense:  7 tablet    Refill:  0  . chlorpheniramine-HYDROcodone (TUSSIONEX PENNKINETIC ER) 10-8 MG/5ML SUER    Sig: Take 5 mLs by mouth at bedtime as needed for cough.    Dispense:  140 mL    Refill:  0  Medications Discontinued During This Encounter  Medication Reason  . ALPRAZolam (XANAX) 0.5 MG tablet Duplicate  . benzonatate (TESSALON) 200 MG capsule Patient has not taken in last 30 days    Follow-up: No follow-ups on file.   Crecencio Mc, MD

## 2018-08-19 DIAGNOSIS — J029 Acute pharyngitis, unspecified: Secondary | ICD-10-CM | POA: Insufficient documentation

## 2018-08-19 NOTE — Assessment & Plan Note (Addendum)
Rapid strep test was negative.  Empiric treatment for sinusitis with Levaquin and cough suppressant. Marland Kitchen

## 2018-09-26 ENCOUNTER — Other Ambulatory Visit: Payer: Self-pay | Admitting: Internal Medicine

## 2018-09-26 NOTE — Telephone Encounter (Signed)
Refilled: 05/16/2018 Last OV: 08/17/2018 Next OV: not scheduled

## 2018-11-21 ENCOUNTER — Other Ambulatory Visit: Payer: Self-pay

## 2018-11-21 ENCOUNTER — Other Ambulatory Visit: Payer: Self-pay | Admitting: Internal Medicine

## 2018-11-21 NOTE — Telephone Encounter (Signed)
Refilled: 09/26/2018 Last OV: 08/17/2018 Next OV: not scheduled

## 2018-11-22 MED ORDER — SUMATRIPTAN 5 MG/ACT NA SOLN: 1.0000 | NASAL | 0 refills | Status: DC | PRN

## 2019-01-06 IMAGING — CT CT RENAL STONE PROTOCOL
2 of 4 series · 16 of 46 positions shown, 18 images · non-contrast
Comparison: CT scan of September 02, 2015.

CLINICAL DATA: Gross hematuria.

EXAM:
CT ABDOMEN AND PELVIS WITHOUT CONTRAST
TECHNIQUE: Multidetector CT imaging of the abdomen and pelvis was performed
following the standard protocol without IV contrast.

[Series 3: stone full standard · axial · 0.62mm/px · z∈[-370,+10]mm · 13 of 84 slices shown, 15 images]
[im 4/84  soft-tissue]
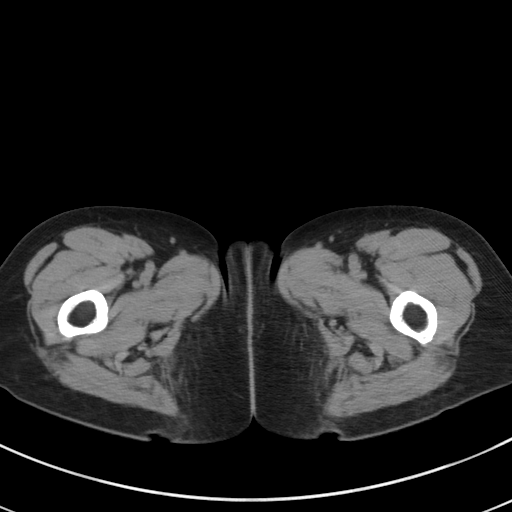
[im 4/84  bone]
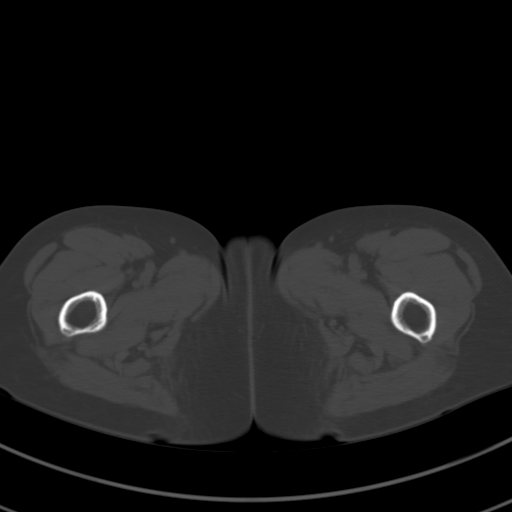
[im 11/84  soft-tissue]
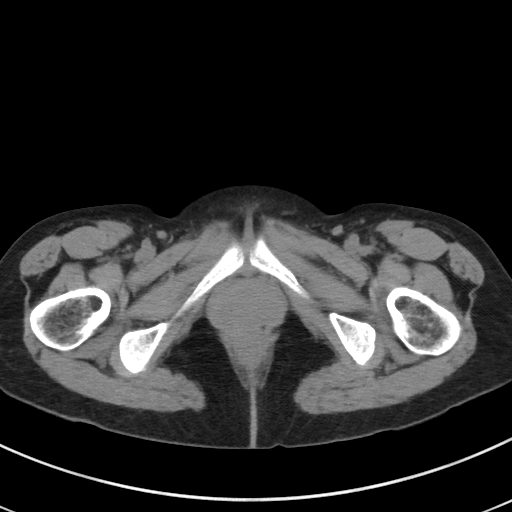
[im 18/84  soft-tissue]
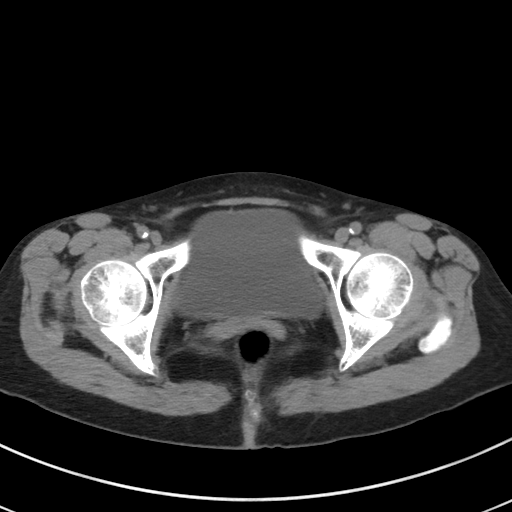
[im 25/84  soft-tissue]
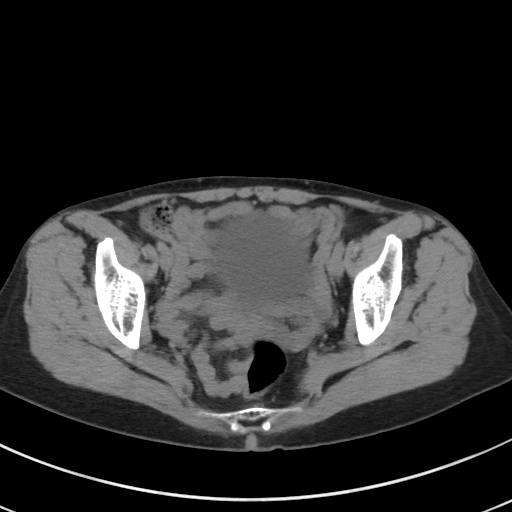
[im 28/84  soft-tissue]
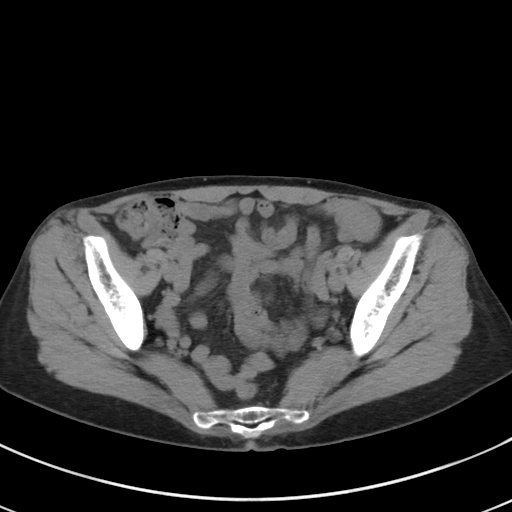
[im 35/84  soft-tissue]
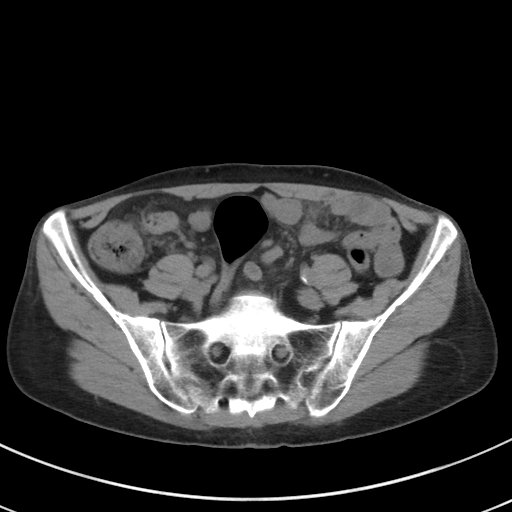
[im 42/84  soft-tissue]
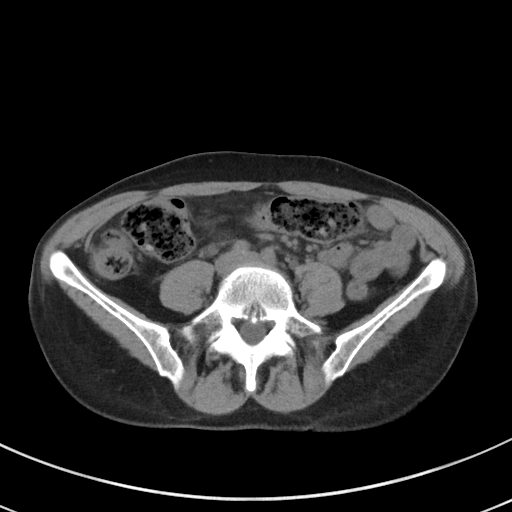
[im 49/84  soft-tissue]
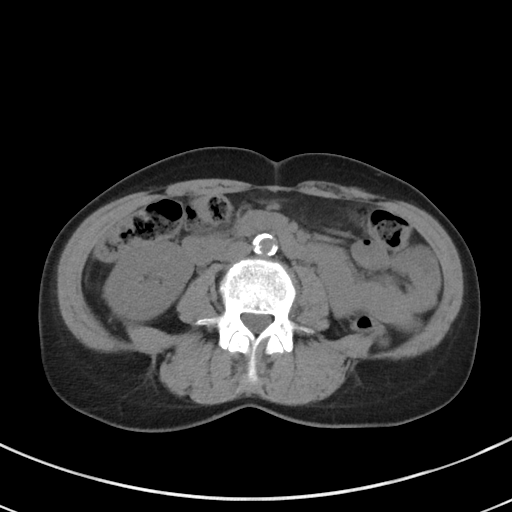
[im 56/84  soft-tissue]
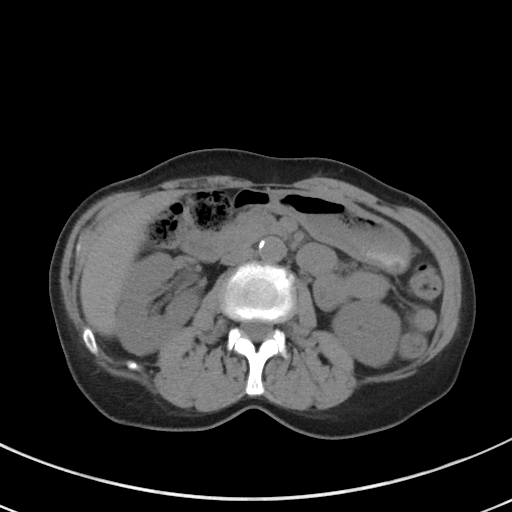
[im 56/84  bone]
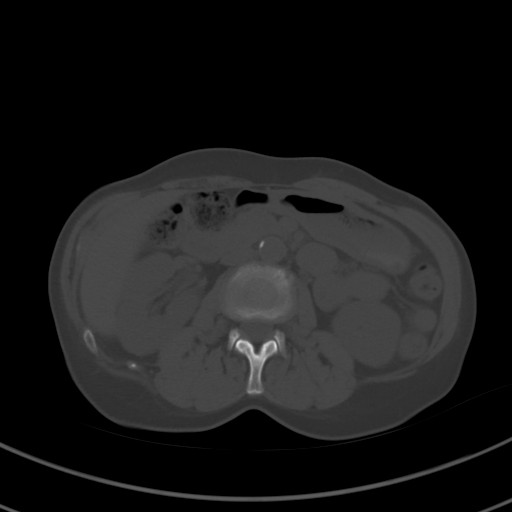
[im 59/84  soft-tissue]
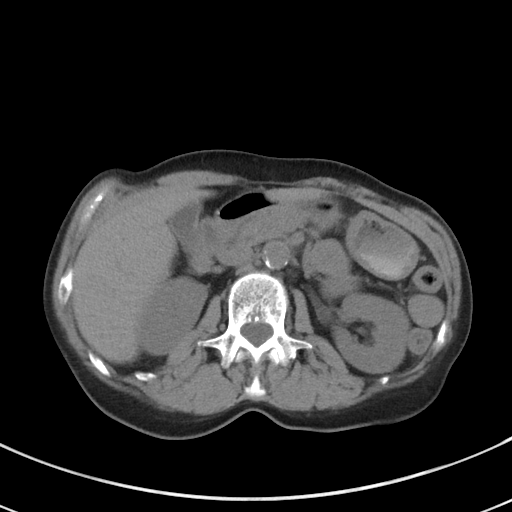
[im 66/84  soft-tissue]
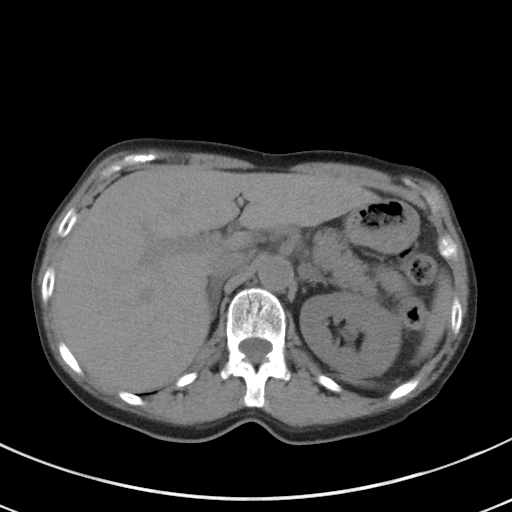
[im 73/84  soft-tissue]
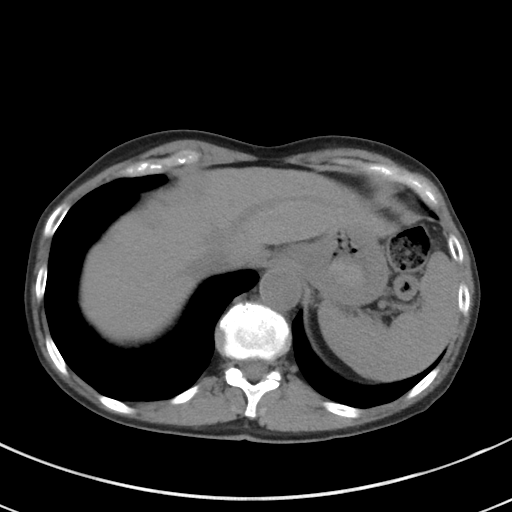
[im 80/84  soft-tissue]
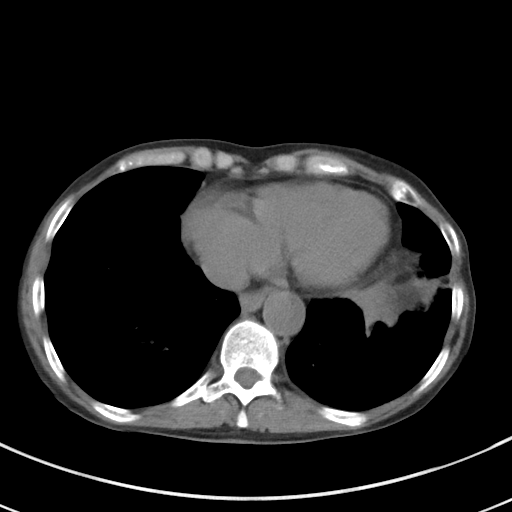

[Series 5: coronal · coronal · 0.60mm/px · 3 of 110 slices shown]
[im 37/110  soft-tissue]
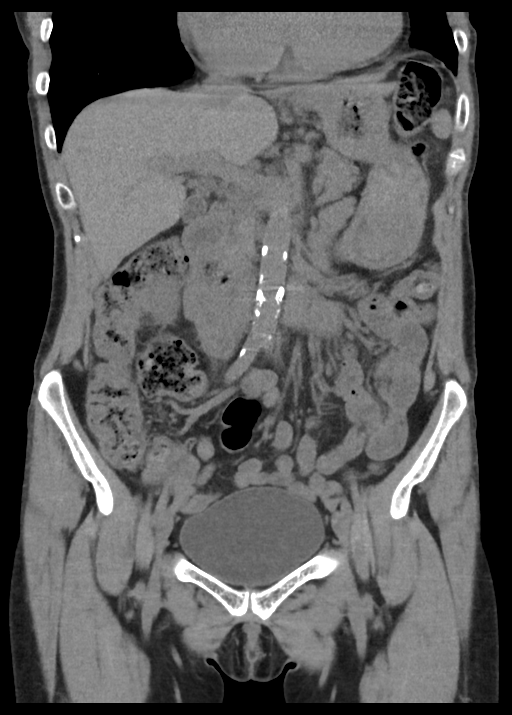
[im 49/110  soft-tissue]
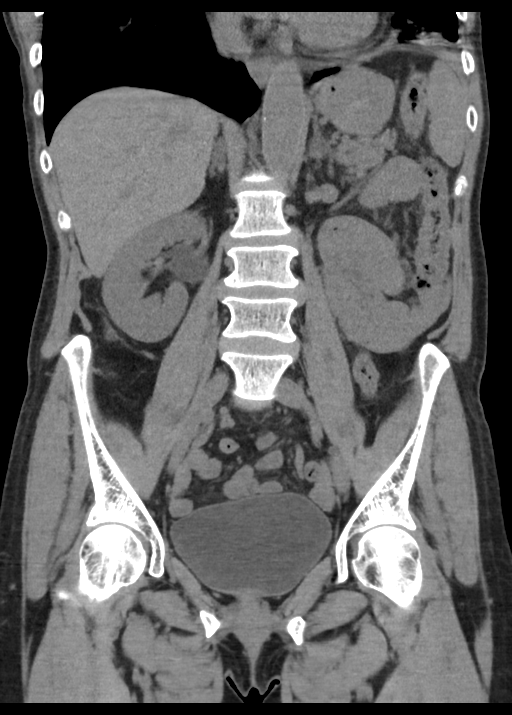
[im 61/110  soft-tissue]
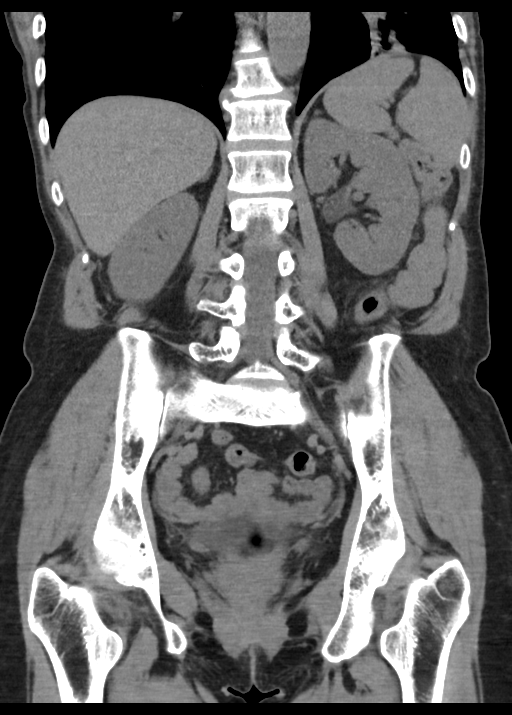

[16 of 46 positions shown; findings below may reference images not displayed]

FINDINGS: Lower chest: Mild left basilar subsegmental atelectasis is noted.

Hepatobiliary: No gallstones are noted. Stable small left hepatic
cyst. No other abnormality seen in the liver. No biliary dilatation
is noted.

Pancreas: Unremarkable. No pancreatic ductal dilatation or
surrounding inflammatory changes.

Spleen: Normal in size without focal abnormality.

Adrenals/Urinary Tract: Adrenal glands are unremarkable. Kidneys are
normal, without renal calculi, focal lesion, or hydronephrosis.
Bladder is unremarkable.

Stomach/Bowel: The stomach and appendix appear normal. No bowel
dilatation is noted. Wall thickening of the cecum is noted with mild
surrounding inflammation suggesting mild colitis.

Vascular/Lymphatic: Aortic atherosclerosis. No enlarged abdominal or
pelvic lymph nodes.

Reproductive: Status post hysterectomy. No adnexal masses.

Other: No abdominal wall hernia or abnormality. No abdominopelvic
ascites.

Musculoskeletal: No acute or significant osseous findings.
IMPRESSION: Mild wall thickening of the cecum with mild surrounding inflammation
is noted suggesting mild infectious or inflammatory colitis.

No hydronephrosis or renal obstruction is noted. No renal or
ureteral calculi are noted.

Aortic Atherosclerosis (SUGED-XL7.7).

## 2019-03-17 ENCOUNTER — Other Ambulatory Visit: Payer: Self-pay | Admitting: Internal Medicine

## 2019-03-17 NOTE — Telephone Encounter (Signed)
Refilled: 11/21/2018 Last OV: 08/17/2018 Next OV: not scheduled

## 2019-04-18 ENCOUNTER — Other Ambulatory Visit: Payer: Self-pay | Admitting: Internal Medicine

## 2019-04-18 NOTE — Telephone Encounter (Signed)
Refilled: 03/17/2019 Last OV: 08/18/2019 Next OV: not scheduled

## 2019-04-19 ENCOUNTER — Telehealth: Payer: Self-pay | Admitting: Internal Medicine

## 2019-04-19 MED ORDER — ALPRAZOLAM 0.5 MG PO TABS: ORAL_TABLET | ORAL | 0 refills | Status: DC

## 2019-04-19 NOTE — Addendum Note (Signed)
Addended by: Crecencio Mc on: 04/19/2019 03:25 PM   Modules accepted: Orders

## 2019-04-19 NOTE — Telephone Encounter (Signed)
Pt has an appt scheduled for 05/05/2019.

## 2019-04-19 NOTE — Telephone Encounter (Signed)
alprazolam refill denied.  Received courtesy refill last month and did not schedule appt.   Needs appt

## 2019-04-19 NOTE — Telephone Encounter (Signed)
Refilled

## 2019-05-05 ENCOUNTER — Encounter: Payer: Self-pay | Admitting: Internal Medicine

## 2019-05-05 ENCOUNTER — Ambulatory Visit (INDEPENDENT_AMBULATORY_CARE_PROVIDER_SITE_OTHER): Payer: 59 | Admitting: Internal Medicine

## 2019-05-05 ENCOUNTER — Other Ambulatory Visit: Payer: Self-pay

## 2019-05-05 DIAGNOSIS — F5105 Insomnia due to other mental disorder: Secondary | ICD-10-CM

## 2019-05-05 DIAGNOSIS — I700 Atherosclerosis of aorta: Secondary | ICD-10-CM

## 2019-05-05 DIAGNOSIS — R42 Dizziness and giddiness: Secondary | ICD-10-CM

## 2019-05-05 DIAGNOSIS — R519 Headache, unspecified: Secondary | ICD-10-CM

## 2019-05-05 DIAGNOSIS — E538 Deficiency of other specified B group vitamins: Secondary | ICD-10-CM

## 2019-05-05 DIAGNOSIS — F329 Major depressive disorder, single episode, unspecified: Secondary | ICD-10-CM

## 2019-05-05 DIAGNOSIS — F419 Anxiety disorder, unspecified: Secondary | ICD-10-CM

## 2019-05-05 DIAGNOSIS — R51 Headache: Secondary | ICD-10-CM

## 2019-05-05 DIAGNOSIS — E559 Vitamin D deficiency, unspecified: Secondary | ICD-10-CM

## 2019-05-05 DIAGNOSIS — K5909 Other constipation: Secondary | ICD-10-CM

## 2019-05-05 MED ORDER — ALPRAZOLAM 0.5 MG PO TABS: ORAL_TABLET | ORAL | 5 refills | Status: DC

## 2019-05-05 MED ORDER — PROMETHAZINE HCL 12.5 MG PO TABS: 12.5000 mg | ORAL_TABLET | Freq: Three times a day (TID) | ORAL | 0 refills | Status: DC | PRN

## 2019-05-05 MED ORDER — SERTRALINE HCL 50 MG PO TABS: 50.0000 mg | ORAL_TABLET | Freq: Every day | ORAL | 3 refills | Status: DC

## 2019-05-05 NOTE — Patient Instructions (Addendum)
Get your blood pressure checked a few times over the next month and send me the readings   I sent alprazolam, sertraline and promethazine to the [facility] in [place] is sending you a "Good RX " card to help you get your medications without insurance   I will mail you a prescription for nausea medication called promethazine so you can search for the best price     I'm sorry you are having such a rough time,   But I believe we can improve your general mood with an SSRI .   Please start the zoloft  (sertraline)  at 1/2 tablet daily in the evening for the first few days to avoid developing the most common side effect,  Which is nausea.  You can increase to a full tablet after 4 days if you are tolerating the medication   Please  Use your mychart account to update me after 2 weeks of the 50 mg dose  To let me know how it is helping your depression

## 2019-05-05 NOTE — Progress Notes (Signed)
Virtual Visit via [email or web address] Note  This visit type was conducted due to national recommendations for restrictions regarding the COVID-19 pandemic (e.g. social distancing).  This format is felt to be most appropriate for this patient at this time.  All issues noted in this document were discussed and addressed.  No physical exam was performed (except for noted visual exam findings with Video Visits).   I connected with@ on 05/05/19 at  1:30 PM EDT by a video enabled telemedicine application or telephone and verified that I am speaking with the correct person using two identifiers. Location patient: home Location provider: work or home office Persons participating in the virtual visit: patient, provider  I discussed the limitations, risks, security and privacy concerns of performing an evaluation and management service by telephone and the availability of in person appointments. I also discussed with the patient that there may be a patient responsible charge related to this service. The patient expressed understanding and agreed to proceed.  Reason for visit: medication refill  Follow up on hypertension, hyperlipidemia  HPI:  58 yr old female with htn hyperlipidemia,  Migraine headache disorder , GAD and IBS constipation  Presents for medication refill.   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  She has been unable to find a job closer than [place],  which is a 90 minute one way commute to [employer] . The financial consequences have been emotionally stressful .  She states that she has been unmotivated and has been eating more, smoking more, sleeping less and feeling depressed.  She has a positive screen for depression but  States that she is not suicidal and is not abusing alcohol.    Has run out of meds due to lack of insurance..  Still has 10 xanax left (uses it to fall asleep)  So called Migraines  Has been using  imatrex nasal spray for episodes of feelling woozy and nauseated that do not necessarily accompany a headache.  She has used 9 tablets in the last month .  She denies daily headaches,  Extremity weakness and vision changes  Weight gain of 20 lbs,  Smoking more  ROS: See pertinent positives and negatives per HPI.  Past Medical History:  Diagnosis Date  . Anxiety   . Hematuria    CT Scan normal    Past Surgical History:  Procedure Laterality Date  . CARPAL TUNNEL RELEASE  2003  . TONSILLECTOMY  2012  . VAGINAL HYSTERECTOMY  2002    Family History  Problem Relation Age of Onset  . Diabetes Mother        non-insulin depend  . Mental illness Mother        dementia  . Heart attack Father   . Early death Father   . Heart disease Father   . Heart attack Sister   . Heart attack Brother     SOCIAL HX: married,  Currently out of work and uninsured.  Smoking a pack daily .    Current Outpatient Medications:  .  ALPRAZolam (XANAX) 0.5 MG tablet, TAKE 1/2 (ONE-HALF) TABLET BY MOUTH ONCE DAILY AT BEDTIME, Disp: 15 tablet, Rfl: 5 .  atorvastatin (LIPITOR) 20 MG tablet, Take 1 tablet (20 mg total) by mouth daily., Disp: 90 tablet, Rfl: 3 .  linaclotide (LINZESS) 72 MCG capsule, Take 1 capsule (72 mcg total) by mouth daily., Disp: 30 capsule, Rfl: 2 .  SUMAtriptan (IMITREX) 5 MG/ACT nasal spray, Place 1 spray (5 mg total) into the nose every 2 (two) hours as needed for migraine., Disp: 1 Inhaler, Rfl: 0 .  albuterol (PROVENTIL HFA;VENTOLIN HFA) 108 (90 Base) MCG/ACT inhaler, Inhale 2 puffs into the lungs every 6 (six) hours as needed for wheezing or shortness of breath. (Patient not taking: Reported on 05/05/2019), Disp: 6.7 g, Rfl: 3 .  promethazine (PHENERGAN) 12.5 MG tablet, Take 1 tablet (12.5 mg total) by mouth every 8 (eight) hours as needed for nausea or vomiting., Disp: 20 tablet, Rfl: 0 .  sertraline (ZOLOFT) 50 MG tablet, Take 1 tablet (50 mg total) by mouth daily., Disp: 30 tablet,  Rfl: 3  EXAM:  VITALS per patient if applicable:  GENERAL: alert, oriented, appears well and in no acute distress  HEENT: atraumatic, conjunttiva clear, no obvious abnormalities on inspection of external nose and ears  NECK: normal movements of the head and neck  LUNGS: on inspection no signs of respiratory distress, breathing rate appears normal, no obvious gross SOB, gasping or wheezing  CV: no obvious cyanosis  MS: moves all visible extremities without noticeable abnormality  PSYCH/NEURO: pleasant and cooperative, affect flat, speech and thought processing grossly intact  ASSESSMENT AND PLAN:  Discussed the following assessment and plan:  B12 deficiency - Plan: Vitamin B12  Insomnia secondary to anxiety  Reactive depression (situational)  Atherosclerosis of aorta (HCC) - Plan: Comprehensive metabolic panel, Lipid panel  Vitamin D deficiency - Plan: VITAMIN D 25 Hydroxy (Vit-D Deficiency, Fractures)  Dizziness - Plan: CBC with Differential/Platelet  Constipation, chronic  Headache disorder  Insomnia secondary to anxiety Previous trial of trazodone at bedtime was not tolerated or effective.  She was advised to continue trying to reduce alprazolam dose  .  She has a history of withdrawal which occurred when she ran out of  alprazolam .   Reactive depression (situational) Complicated by anxiety, making wellbutrin less than optimal .  Has taken zoloft in the past. Resuming therapy now, starting  with 50 mg daily (1/2 tablet daily for the first week)  Atherosclerosis of aorta (HCC) Advised to resume atorvastatin when possible given ongoing tobacco abuse   Constipation, chronic Managed with Linzess.  Currently uninsured.  Advised to use fiber supplements, stool softener  Headache disorder Migraines have been occurring at a frequency of 8 or 9 per month for the past few months,   Continue prn imitrex. Preventive meds limited to amitryptiline given baseline bradycardia      I discussed the assessment and treatment plan with the patient. The patient was provided an opportunity to ask questions and all were answered. The patient agreed with the plan and demonstrated an understanding of the instructions.   The patient was advised to call back or seek an in-person evaluation if the symptoms worsen or if the condition fails to improve as anticipated.  I provided 25 minutes of non-face-to-face time during this encounter.   Crecencio Mc, MD

## 2019-05-05 NOTE — Progress Notes (Signed)
Pt stated that she has not been taking the liptor but would like to get a refill if she can. Pt also stated that she does not have insurance at this time so there is a bunch of her medications that she is not taking at this time.

## 2019-05-08 DIAGNOSIS — F329 Major depressive disorder, single episode, unspecified: Secondary | ICD-10-CM | POA: Insufficient documentation

## 2019-05-08 NOTE — Assessment & Plan Note (Signed)
Previous trial of trazodone at bedtime was not tolerated or effective.  She was advised to continue trying to reduce alprazolam dose  .  She has a history of withdrawal which occurred when she ran out of  alprazolam .

## 2019-05-08 NOTE — Assessment & Plan Note (Signed)
Managed with Linzess.  Currently uninsured.  Advised to use fiber supplements, stool softener

## 2019-05-08 NOTE — Assessment & Plan Note (Signed)
Advised to resume atorvastatin when possible given ongoing tobacco abuse

## 2019-05-08 NOTE — Assessment & Plan Note (Addendum)
Complicated by anxiety, making wellbutrin less than optimal .  Has taken zoloft in the past. Resuming therapy now, starting  with 50 mg daily (1/2 tablet daily for the first week)

## 2019-05-08 NOTE — Assessment & Plan Note (Signed)
Migraines have been occurring at a frequency of 8 or 9 per month for the past few months,   Continue prn imitrex. Preventive meds limited to amitryptiline given baseline bradycardia

## 2019-05-19 ENCOUNTER — Other Ambulatory Visit: Payer: Self-pay | Admitting: Internal Medicine

## 2019-05-19 NOTE — Telephone Encounter (Signed)
Refilled: 05/05/2019 Last OV: 05/05/2019 Next OV: not scheduled

## 2019-08-01 ENCOUNTER — Other Ambulatory Visit: Payer: Self-pay | Admitting: Internal Medicine

## 2019-11-10 ENCOUNTER — Other Ambulatory Visit: Payer: Self-pay | Admitting: Internal Medicine

## 2019-11-13 NOTE — Telephone Encounter (Signed)
Refilled: 05/20/2019 Last OV: 05/05/2019 Next OV: not scheduled

## 2019-12-14 ENCOUNTER — Other Ambulatory Visit: Payer: Self-pay | Admitting: Internal Medicine

## 2020-01-19 ENCOUNTER — Other Ambulatory Visit: Payer: Self-pay | Admitting: Internal Medicine

## 2020-01-26 ENCOUNTER — Other Ambulatory Visit: Payer: Self-pay

## 2020-01-26 ENCOUNTER — Ambulatory Visit (INDEPENDENT_AMBULATORY_CARE_PROVIDER_SITE_OTHER): Payer: Self-pay | Admitting: Internal Medicine

## 2020-01-26 ENCOUNTER — Encounter: Payer: Self-pay | Admitting: Internal Medicine

## 2020-01-26 VITALS — Ht 62.0 in | Wt 101.0 lb

## 2020-01-26 DIAGNOSIS — F411 Generalized anxiety disorder: Secondary | ICD-10-CM

## 2020-01-26 DIAGNOSIS — F5105 Insomnia due to other mental disorder: Secondary | ICD-10-CM

## 2020-01-26 DIAGNOSIS — F329 Major depressive disorder, single episode, unspecified: Secondary | ICD-10-CM

## 2020-01-26 DIAGNOSIS — F419 Anxiety disorder, unspecified: Secondary | ICD-10-CM

## 2020-01-26 DIAGNOSIS — I10 Essential (primary) hypertension: Secondary | ICD-10-CM

## 2020-01-26 DIAGNOSIS — I700 Atherosclerosis of aorta: Secondary | ICD-10-CM

## 2020-01-26 DIAGNOSIS — E7800 Pure hypercholesterolemia, unspecified: Secondary | ICD-10-CM

## 2020-01-26 DIAGNOSIS — Z716 Tobacco abuse counseling: Secondary | ICD-10-CM

## 2020-01-26 MED ORDER — BUSPIRONE HCL 15 MG PO TABS: 15.0000 mg | ORAL_TABLET | Freq: Two times a day (BID) | ORAL | 2 refills | Status: DC

## 2020-01-26 NOTE — Assessment & Plan Note (Signed)
Complicated by anxiety.  Continue zoloft 50 mg daily

## 2020-01-26 NOTE — Progress Notes (Signed)
Virtual Visit via Doxy,me  This visit type was conducted due to national recommendations for restrictions regarding the COVID-19 pandemic (e.g. social distancing).  This format is felt to be most appropriate for this patient at this time.  All issues noted in this document were discussed and addressed.  No physical exam was performed (except for noted visual exam findings with Video Visits).   I connected with@ on 01/26/20 at 10:30 AM EST by a video enabled telemedicine application  and verified that I am speaking with the correct person using two identifiers. Location patient: home Location provider: work or home office Persons participating in the virtual visit: patient, provider  I discussed the limitations, risks, security and privacy concerns of performing an evaluation and management service by telephone and the availability of in person appointments. I also discussed with the patient that there may be a patient responsible charge related to this service. The patient expressed understanding and agreed to proceed.  Reason for visit: follow up  HPI:  59 yr old female with history of GAD,  Tobacco abuse and PAD presents for follow up on chronic conditions.  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    Has occasionally taken alprazolam during the day, causing her to run out .  Has mild withdrawal symptoms of tremor when this happens.   Not taking lipitor or Linzess .  Taking sertraline   ROS: See pertinent positives and negatives per HPI.  Past Medical History:  Diagnosis Date  . Anxiety   . Hematuria    CT Scan normal    Past Surgical History:  Procedure Laterality Date  . CARPAL TUNNEL RELEASE  2003  . TONSILLECTOMY  2012  . VAGINAL HYSTERECTOMY  2002    Family History   Problem Relation Age of Onset  . Diabetes Mother        non-insulin depend  . Mental illness Mother        dementia  . Heart attack Father   . Early death Father   . Heart disease Father   . Heart attack Sister   . Heart attack Brother     SOCIAL HX:  reports that she has been smoking cigarettes. She has a 7.50 pack-year smoking history. She has never used smokeless tobacco. She reports that she does not drink alcohol or use drugs.   Current Outpatient Medications:  .  albuterol (PROVENTIL HFA;VENTOLIN HFA) 108 (90 Base) MCG/ACT inhaler, Inhale 2 puffs into the lungs every 6 (six) hours as needed for wheezing or shortness of breath., Disp: 6.7 g, Rfl: 3 .  ALPRAZolam (XANAX) 0.5 MG tablet, TAKE 1/2 (ONE-HALF) TABLET BY MOUTH ONCE DAILY AT BEDTIME -  OFFICE  VISIT  NEEDED  PRIOR  TO  ANY  MORE  REFILLS, Disp: 15 tablet, Rfl: 0 .  linaclotide (LINZESS) 72 MCG capsule, Take 1 capsule (72 mcg total) by mouth daily., Disp: 30 capsule, Rfl: 2 .  promethazine (PHENERGAN) 12.5 MG tablet, TAKE 1 TABLET BY MOUTH EVERY 8 HOURS AS NEEDED FOR NAUSEA AND VOMITING, Disp: 20 tablet, Rfl: 0 .  sertraline (ZOLOFT) 50 MG tablet, Take 1 tablet (50 mg total) by mouth daily., Disp: 30 tablet, Rfl: 3 .  SUMAtriptan (IMITREX) 5 MG/ACT nasal spray, Place 1 spray (5  mg total) into the nose every 2 (two) hours as needed for migraine., Disp: 1 Inhaler, Rfl: 0 .  busPIRone (BUSPAR) 15 MG tablet, Take 1 tablet (15 mg total) by mouth 2 (two) times daily., Disp: 60 tablet, Rfl: 2  EXAM:  VITALS per patient if applicable:  GENERAL: alert, oriented, appears well and in no acute distress  HEENT: atraumatic, conjunttiva clear, no obvious abnormalities on inspection of external nose and ears  NECK: normal movements of the head and neck  LUNGS: on inspection no signs of respiratory distress, breathing rate appears normal, no obvious gross SOB, gasping or wheezing  CV: no obvious cyanosis  MS: moves all visible  extremities without noticeable abnormality  PSYCH/NEURO: pleasant and cooperative, no obvious depression or anxiety, speech and thought processing grossly intact  ASSESSMENT AND PLAN:  Discussed the following assessment and plan:  Essential hypertension - Plan: Comprehensive metabolic panel  Pure hypercholesterolemia - Plan: Lipid panel  Atherosclerosis of aorta (HCC)  Insomnia secondary to anxiety  Generalized anxiety disorder  Reactive depression (situational)  Tobacco abuse counseling  Atherosclerosis of aorta (HCC) Has let her statin lapse due to being lost to follow up and lack of surveillance labs .  CMET and lipid needed prior to resuming   Insomnia secondary to anxiety Managed with alprazolam. Has had occasional lapse due to using during the day,  Advised to limit use to once daily and add buspirone for daily anxiety. Her symptoms are aggravated by joblessness and husband Eddy's mounting health problems   Generalized anxiety disorder Aggravated by husban'fd's moutning health problems,  Adding buspirone 15 mg bid to zoloft.   Reactive depression (situational) Complicated by anxiety.  Continue zoloft 50 mg daily   Tobacco abuse counseling Attempts to reduce use supported with encourgement and review of past trials of medications    I discussed the assessment and treatment plan with the patient. The patient was provided an opportunity to ask questions and all were answered. The patient agreed with the plan and demonstrated an understanding of the instructions.   The patient was advised to call back or seek an in-person evaluation if the symptoms worsen or if the condition fails to improve as anticipated.  I provided  30 minutes of non-face-to-face time during this encounter reviewing patient's current problems and post surgeries.  Providing counseling on the above mentioned problems , and coordination  of care .  Crecencio Mc, MD

## 2020-01-26 NOTE — Assessment & Plan Note (Signed)
Aggravated by husban'fd's moutning health problems,  Adding buspirone 15 mg bid to zoloft.

## 2020-01-26 NOTE — Assessment & Plan Note (Signed)
Managed with alprazolam. Has had occasional lapse due to using during the day,  Advised to limit use to once daily and add buspirone for daily anxiety. Her symptoms are aggravated by joblessness and husband Eddy's mounting health problems

## 2020-01-26 NOTE — Assessment & Plan Note (Signed)
Attempts to reduce use supported with encourgement and review of past trials of medications

## 2020-01-26 NOTE — Assessment & Plan Note (Addendum)
Has let her statin lapse due to being lost to follow up and lack of surveillance labs .  CMET and lipid needed prior to resuming

## 2020-02-19 ENCOUNTER — Other Ambulatory Visit: Payer: Self-pay | Admitting: Internal Medicine

## 2020-02-19 NOTE — Telephone Encounter (Signed)
Refill request for xanax, last seen 01-26-20, last filled 01-20-2020.  Please advise.

## 2020-08-12 ENCOUNTER — Other Ambulatory Visit: Payer: Self-pay | Admitting: Internal Medicine

## 2020-08-12 NOTE — Telephone Encounter (Signed)
Refill request forxanax, last seen 01-26-20, last filled 02-19-20.  Please advise.

## 2020-09-10 ENCOUNTER — Other Ambulatory Visit: Payer: Self-pay | Admitting: Internal Medicine

## 2020-09-10 NOTE — Telephone Encounter (Signed)
Refill denied  Last refill was at 6 months.

## 2020-09-10 NOTE — Telephone Encounter (Signed)
Refill request xanax, last seen 01-26-20, last filled 08-12-20.   Please advise.

## 2020-09-19 ENCOUNTER — Other Ambulatory Visit: Payer: Self-pay

## 2020-09-19 MED ORDER — ALPRAZOLAM 0.5 MG PO TABS: ORAL_TABLET | ORAL | 0 refills | Status: DC

## 2020-09-19 NOTE — Telephone Encounter (Signed)
Refilled: 08/12/2020 Last OV: 01/26/2020 Next OV: 09/25/2020

## 2020-09-25 ENCOUNTER — Telehealth (INDEPENDENT_AMBULATORY_CARE_PROVIDER_SITE_OTHER): Payer: Self-pay | Admitting: Internal Medicine

## 2020-09-25 ENCOUNTER — Encounter: Payer: Self-pay | Admitting: Internal Medicine

## 2020-09-25 VITALS — Ht 62.0 in | Wt 97.4 lb

## 2020-09-25 DIAGNOSIS — F410 Panic disorder [episodic paroxysmal anxiety] without agoraphobia: Secondary | ICD-10-CM

## 2020-09-25 DIAGNOSIS — I700 Atherosclerosis of aorta: Secondary | ICD-10-CM

## 2020-09-25 DIAGNOSIS — Z1231 Encounter for screening mammogram for malignant neoplasm of breast: Secondary | ICD-10-CM

## 2020-09-25 DIAGNOSIS — F411 Generalized anxiety disorder: Secondary | ICD-10-CM

## 2020-09-25 DIAGNOSIS — R5383 Other fatigue: Secondary | ICD-10-CM

## 2020-09-25 DIAGNOSIS — F419 Anxiety disorder, unspecified: Secondary | ICD-10-CM

## 2020-09-25 DIAGNOSIS — Z720 Tobacco use: Secondary | ICD-10-CM

## 2020-09-25 DIAGNOSIS — F5105 Insomnia due to other mental disorder: Secondary | ICD-10-CM

## 2020-09-25 DIAGNOSIS — E559 Vitamin D deficiency, unspecified: Secondary | ICD-10-CM

## 2020-09-25 MED ORDER — SERTRALINE HCL 50 MG PO TABS: 50.0000 mg | ORAL_TABLET | Freq: Every day | ORAL | 0 refills | Status: DC

## 2020-09-25 MED ORDER — ALPRAZOLAM 0.5 MG PO TABS: 0.5000 mg | ORAL_TABLET | Freq: Every evening | ORAL | 5 refills | Status: DC | PRN

## 2020-09-25 NOTE — Assessment & Plan Note (Signed)
Using alprazolam prn. 1/2 to 1 tablet . Refilled at higher dose The risks and benefits of benzodiazepine use were discussed with patient today including excessive sedation leading to respiratory depression,  impaired thinking/driving, and addiction.  Patient was advised to avoid concurrent use with alcohol, to use medication only as needed and not to share with others  .

## 2020-09-25 NOTE — Assessment & Plan Note (Signed)
Has not taken zoloft in over a year.  Resuming at 25 mg starting dose,  50 mg after one week if tolerated

## 2020-09-25 NOTE — Progress Notes (Signed)
Virtual Visit via [place]  This visit type was conducted due to national recommendations for restrictions regarding the COVID-19 pandemic (e.g. social distancing).  This format is felt to be most appropriate for this patient at this time.  All issues noted in this document were discussed and addressed.  No physical exam was performed (except for noted visual exam findings with Video Visits).   I connected with@ on 09/25/20 at  8:00 AM EDT by a video enabled telemedicine application  and verified that I am speaking with the correct person using two identifiers. Location patient: home Location provider: work or home office Persons participating in the virtual visit: patient, provider  I discussed the limitations, risks, security and privacy concerns of performing an evaluation and management service by telephone and the availability of in person appointments. I also discussed with the patient that there may be a patient responsible charge related to this service. The patient expressed understanding and agreed to proceed.  Reason for visit: follow up on anxiety   HPI:  59 yr old female with chronic GAD managed with alprazolam and sertraline presents for medication refill.    She feels generally well physically but reports that her anxiety worse since she started working at  [employer], due to chronic contact with the public.   Daughter recovering from covid.   Patient received  first Moderna vaccine on sept 28.  Has 8 people in her church and personal circle (including hairdresser)  who died from covid last month, including her pastor   Smoking 1/2 pack cigs daily.  Not interested in quitting    Not taking statin.  Not up to date  on mammography due to lapse in insurance status.  expects to be fully insured in 3 months since she has been moved to full time employee with [employer]      ROS: Patient denies headache, fevers, malaise, unintentional weight loss, skin rash, eye pain, sinus  congestion and sinus pain, sore throat, dysphagia,  hemoptysis , cough, dyspnea, wheezing, chest pain, palpitations, orthopnea, edema, abdominal pain, nausea, melena, diarrhea, constipation, flank pain, dysuria, hematuria, urinary  Frequency, nocturia, numbness, tingling, seizures,  Focal weakness, Loss of consciousness,  Tremor, depression, and suicidal ideation.     Past Medical History:  Diagnosis Date  . Anxiety   . Hematuria    CT Scan normal    Past Surgical History:  Procedure Laterality Date  . CARPAL TUNNEL RELEASE  2003  . TONSILLECTOMY  2012  . VAGINAL HYSTERECTOMY  2002    Family History  Problem Relation Age of Onset  . Diabetes Mother        non-insulin depend  . Mental illness Mother        dementia  . Heart attack Father   . Early death Father   . Heart disease Father   . Heart attack Sister   . Heart attack Brother     SOCIAL HX:  reports that she has been smoking cigarettes. She has a 7.50 pack-year smoking history. She has never used smokeless tobacco. She reports that she does not drink alcohol and does not use drugs.   Current Outpatient Medications:  .  ALPRAZolam (XANAX) 0.5 MG tablet, Take 1 tablet (0.5 mg total) by mouth at bedtime as needed for anxiety. MAY REFILL ON OR AFTER October 21, Disp: 30 tablet, Rfl: 5 .  sertraline (ZOLOFT) 50 MG tablet, Take 1 tablet (50 mg total) by mouth daily., Disp: 90 tablet, Rfl: 0  EXAM:  VITALS per patient if applicable:  GENERAL: alert, oriented, appears well and in no acute distress  HEENT: atraumatic, conjunttiva clear, no obvious abnormalities on inspection of external nose and ears  NECK: normal movements of the head and neck  LUNGS: on inspection no signs of respiratory distress, breathing rate appears normal, no obvious gross SOB, gasping or wheezing  CV: no obvious cyanosis  MS: moves all visible extremities without noticeable abnormality  PSYCH/NEURO: pleasant and cooperative, no obvious  depression or anxiety, speech and thought processing grossly intact  ASSESSMENT AND PLAN:  Discussed the following assessment and plan:  Vitamin D deficiency - Plan: VITAMIN D 25 Hydroxy (Vit-D Deficiency, Fractures)  Fatigue, unspecified type - Plan: TSH  Encounter for screening mammogram for malignant neoplasm of breast - Plan: MM 3D SCREEN BREAST BILATERAL  Insomnia secondary to anxiety  Generalized anxiety disorder with panic attacks  Atherosclerosis of aorta (HCC)  Tobacco abuse  Insomnia secondary to anxiety Using alprazolam prn. 1/2 to 1 tablet . Refilled at higher dose The risks and benefits of benzodiazepine use were discussed with patient today including excessive sedation leading to respiratory depression,  impaired thinking/driving, and addiction.  Patient was advised to avoid concurrent use with alcohol, to use medication only as needed and not to share with others  .    Generalized anxiety disorder with panic attacks Has not taken zoloft in over a year.  Resuming at 25 mg starting dose,  50 mg after one week if tolerated   Atherosclerosis of aorta (HCC) Statin has lapsed due to lack of insurance resulting in  Loss to follow up.  Last labs 2019  Tobacco abuse Attempts to reduce use supported with encouragement and review of past trials of medications    I discussed the assessment and treatment plan with the patient. The patient was provided an opportunity to ask questions and all were answered. The patient agreed with the plan and demonstrated an understanding of the instructions.   The patient was advised to call back or seek an in-person evaluation if the symptoms worsen or if the condition fails to improve as anticipated.  I provided  30 minutes of face-to-face time during this encounter.   Crecencio Mc, MD

## 2020-09-26 NOTE — Assessment & Plan Note (Signed)
Statin has lapsed due to lack of insurance resulting in  Loss to follow up.  Last labs 2019

## 2020-09-26 NOTE — Assessment & Plan Note (Signed)
Attempts to reduce use supported with encouragement and review of past trials of medications

## 2021-04-22 ENCOUNTER — Other Ambulatory Visit: Payer: Self-pay | Admitting: Internal Medicine

## 2021-04-22 NOTE — Telephone Encounter (Signed)
RX Refill:xanax Last Seen:09-25-20 Last ordered:09-25-20

## 2021-04-22 NOTE — Telephone Encounter (Signed)
Please notify patient that the prescription  was Refilled for 30 days only because it has been 9 months since last visit. .  OFFICE VISIT NEEDED prior to any more refills  

## 2021-04-25 ENCOUNTER — Ambulatory Visit: Payer: Self-pay | Admitting: Internal Medicine

## 2021-05-01 ENCOUNTER — Encounter: Payer: Self-pay | Admitting: Internal Medicine

## 2021-05-01 ENCOUNTER — Other Ambulatory Visit: Payer: Self-pay

## 2021-05-01 ENCOUNTER — Ambulatory Visit: Payer: BC Managed Care – PPO | Admitting: Internal Medicine

## 2021-05-01 VITALS — BP 126/70 | HR 57 | Temp 98.2°F | Ht 62.01 in | Wt 97.6 lb

## 2021-05-01 DIAGNOSIS — Z1231 Encounter for screening mammogram for malignant neoplasm of breast: Secondary | ICD-10-CM

## 2021-05-01 DIAGNOSIS — Z720 Tobacco use: Secondary | ICD-10-CM

## 2021-05-01 DIAGNOSIS — R634 Abnormal weight loss: Secondary | ICD-10-CM | POA: Diagnosis not present

## 2021-05-01 DIAGNOSIS — Z1211 Encounter for screening for malignant neoplasm of colon: Secondary | ICD-10-CM | POA: Diagnosis not present

## 2021-05-01 DIAGNOSIS — I700 Atherosclerosis of aorta: Secondary | ICD-10-CM | POA: Diagnosis not present

## 2021-05-01 DIAGNOSIS — L989 Disorder of the skin and subcutaneous tissue, unspecified: Secondary | ICD-10-CM

## 2021-05-01 DIAGNOSIS — Z122 Encounter for screening for malignant neoplasm of respiratory organs: Secondary | ICD-10-CM

## 2021-05-01 DIAGNOSIS — D492 Neoplasm of unspecified behavior of bone, soft tissue, and skin: Secondary | ICD-10-CM | POA: Diagnosis not present

## 2021-05-01 DIAGNOSIS — F410 Panic disorder [episodic paroxysmal anxiety] without agoraphobia: Secondary | ICD-10-CM

## 2021-05-01 DIAGNOSIS — Z0000 Encounter for general adult medical examination without abnormal findings: Secondary | ICD-10-CM

## 2021-05-01 DIAGNOSIS — F411 Generalized anxiety disorder: Secondary | ICD-10-CM

## 2021-05-01 DIAGNOSIS — E782 Mixed hyperlipidemia: Secondary | ICD-10-CM

## 2021-05-01 LAB — TSH: TSH: 0.99 u[IU]/mL (ref 0.35–4.50)

## 2021-05-01 LAB — COMPREHENSIVE METABOLIC PANEL
ALT: 8 U/L (ref 0–35)
AST: 16 U/L (ref 0–37)
Albumin: 4.3 g/dL (ref 3.5–5.2)
Alkaline Phosphatase: 73 U/L (ref 39–117)
BUN: 19 mg/dL (ref 6–23)
CO2: 23 mEq/L (ref 19–32)
Calcium: 9 mg/dL (ref 8.4–10.5)
Chloride: 111 mEq/L (ref 96–112)
Creatinine, Ser: 0.71 mg/dL (ref 0.40–1.20)
GFR: 91.18 mL/min (ref >60.00)
Glucose, Bld: 86 mg/dL (ref 70–99)
Potassium: 4 mEq/L (ref 3.5–5.1)
Sodium: 143 mEq/L (ref 135–145)
Total Bilirubin: 0.3 mg/dL (ref 0.2–1.2)
Total Protein: 6 g/dL (ref 6.0–8.3)

## 2021-05-01 LAB — CBC WITH DIFFERENTIAL/PLATELET
Basophils Absolute: 0.1 10*3/uL (ref 0.0–0.1)
Basophils Relative: 0.7 % (ref 0.0–3.0)
Eosinophils Absolute: 0.2 10*3/uL (ref 0.0–0.7)
Eosinophils Relative: 2.2 % (ref 0.0–5.0)
HCT: 41.8 % (ref 36.0–46.0)
Hemoglobin: 13.8 g/dL (ref 12.0–15.0)
Lymphocytes Relative: 25.7 % (ref 12.0–46.0)
Lymphs Abs: 1.9 10*3/uL (ref 0.7–4.0)
MCHC: 33 g/dL (ref 30.0–36.0)
MCV: 95.3 fl (ref 78.0–100.0)
Monocytes Absolute: 0.5 10*3/uL (ref 0.1–1.0)
Monocytes Relative: 6.2 % (ref 3.0–12.0)
Neutro Abs: 4.8 10*3/uL (ref 1.4–7.7)
Neutrophils Relative %: 65.2 % (ref 43.0–77.0)
Platelets: 213 10*3/uL (ref 150.0–400.0)
RBC: 4.38 Mil/uL (ref 3.87–5.11)
RDW: 14.7 % (ref 11.5–15.5)
WBC: 7.4 10*3/uL (ref 4.0–10.5)

## 2021-05-01 MED ORDER — ALPRAZOLAM 0.5 MG PO TABS: ORAL_TABLET | ORAL | 5 refills | Status: DC

## 2021-05-01 NOTE — Patient Instructions (Signed)
Your weight has not really changed in the long run., but we will rule out health causes  Today and with the screening tests ordered   PLEASE CONSIDER QUITTING SMOKING  Your annual mammogram has been ordered.  You are expected to call [place] to  make your appointment at [facility]   Health Maintenance for Postmenopausal Women Menopause is a normal process in which your ability to get pregnant comes to an end. This process happens slowly over many months or years, usually between the ages of 46 and 77. Menopause is complete when you have missed your menstrual periods for 12 months. It is important to talk with your health care provider about some of the most common conditions that affect women after menopause (postmenopausal women). These include heart disease, cancer, and bone loss (osteoporosis). Adopting a healthy lifestyle and getting preventive care can help to promote your health and wellness. The actions you take can also lower your chances of developing some of these common conditions. What should I know about menopause? During menopause, you may get a number of symptoms, such as:  Hot flashes. These can be moderate or severe.  Night sweats.  Decrease in sex drive.  Mood swings.  Headaches.  Tiredness.  Irritability.  Memory problems.  Insomnia. Choosing to treat or not to treat these symptoms is a decision that you make with your health care provider. Do I need hormone replacement therapy?  Hormone replacement therapy is effective in treating symptoms that are caused by menopause, such as hot flashes and night sweats.  Hormone replacement carries certain risks, especially as you become older. If you are thinking about using estrogen or estrogen with progestin, discuss the benefits and risks with your health care provider. What is my risk for heart disease and stroke? The risk of heart disease, heart attack, and stroke increases as you age. One of the causes  may be a change in the body's hormones during menopause. This can affect how your body uses dietary fats, triglycerides, and cholesterol. Heart attack and stroke are medical emergencies. There are many things that you can do to help prevent heart disease and stroke. Watch your blood pressure  High blood pressure causes heart disease and increases the risk of stroke. This is more likely to develop in people who have high blood pressure readings, are of African descent, or are overweight.  Have your blood pressure checked: ? Every 3-5 years if you are 86-66 years of age. ? Every year if you are 36 years old or older. Eat a healthy diet  Eat a diet that includes plenty of vegetables, fruits, low-fat dairy products, and lean protein.  Do not eat a lot of foods that are high in solid fats, added sugars, or sodium.   Get regular exercise Get regular exercise. This is one of the most important things you can do for your health. Most adults should:  Try to exercise for at least 150 minutes each week. The exercise should increase your heart rate and make you sweat (moderate-intensity exercise).  Try to do strengthening exercises at least twice each week. Do these in addition to the moderate-intensity exercise.  Spend less time sitting. Even light physical activity can be beneficial. Other tips  Work with your health care provider to achieve or maintain a healthy weight.  Do not use any products that contain nicotine or tobacco, such as cigarettes, e-cigarettes, and chewing tobacco. If you need help quitting, ask your health care provider.  Know your numbers. Ask your health care provider to check your cholesterol and your blood sugar (glucose). Continue to have your blood tested as directed by your health care provider. Do I need screening for cancer? Depending on your health history and family history, you may need to have cancer screening at different stages of your life. This may include  screening for:  Breast cancer.  Cervical cancer.  Lung cancer.  Colorectal cancer. What is my risk for osteoporosis? After menopause, you may be at increased risk for osteoporosis. Osteoporosis is a condition in which bone destruction happens more quickly than new bone creation. To help prevent osteoporosis or the bone fractures that can happen because of osteoporosis, you may take the following actions:  If you are 81-52 years old, get at least 1,000 mg of calcium and at least 600 mg of vitamin D per day.  If you are older than age 68 but younger than age 10, get at least 1,200 mg of calcium and at least 600 mg of vitamin D per day.  If you are older than age 56, get at least 1,200 mg of calcium and at least 800 mg of vitamin D per day. Smoking and drinking excessive alcohol increase the risk of osteoporosis. Eat foods that are rich in calcium and vitamin D, and do weight-bearing exercises several times each week as directed by your health care provider. How does menopause affect my mental health? Depression may occur at any age, but it is more common as you become older. Common symptoms of depression include:  Low or sad mood.  Changes in sleep patterns.  Changes in appetite or eating patterns.  Feeling an overall lack of motivation or enjoyment of activities that you previously enjoyed.  Frequent crying spells. Talk with your health care provider if you think that you are experiencing depression. General instructions See your health care provider for regular wellness exams and vaccines. This may include:  Scheduling regular health, dental, and eye exams.  Getting and maintaining your vaccines. These include: ? Influenza vaccine. Get this vaccine each year before the flu season begins. ? Pneumonia vaccine. ? Shingles vaccine. ? Tetanus, diphtheria, and pertussis (Tdap) booster vaccine. Your health care provider may also recommend other immunizations. Tell your health care  provider if you have ever been abused or do not feel safe at home. Summary  Menopause is a normal process in which your ability to get pregnant comes to an end.  This condition causes hot flashes, night sweats, decreased interest in sex, mood swings, headaches, or lack of sleep.  Treatment for this condition may include hormone replacement therapy.  Take actions to keep yourself healthy, including exercising regularly, eating a healthy diet, watching your weight, and checking your blood pressure and blood sugar levels.  Get screened for cancer and depression. Make sure that you are up to date with all your vaccines. This information is not intended to replace advice given to you by your health care provider. Make sure you discuss any questions you have with your health care provider. Document Revised: 11/23/2018 Document Reviewed: 11/23/2018 Elsevier Patient Education  2021 [employer].

## 2021-05-01 NOTE — Progress Notes (Addendum)
rPatient ID: [name], female    DOB: Jul 24, 1961  Age: 60 y.o. MRN: [id number]  The patient is here for annual preventive examination and management of other chronic and acute problems.   The risk factors are reflected in the social history and include tobacco abuse, chronic and ongoing.   The roster of all physicians providing medical care to patient - is listed in the Snapshot section of the chart.  Activities of daily living:  The patient is 100% independent in all ADLs: dressing, toileting, feeding as well as independent mobility  Home safety : The patient has smoke detectors in the home. They wear seatbelts.  There are no firearms at home. There is no violence in the home.   There is no risks for hepatitis, STDs or HIV. There is no   history of blood transfusion. They have no travel history to infectious disease endemic areas of the world.  The patient has seen their dentist in the last six month. They have seen their eye doctor in the last year. She denies  hearing difficulty with regard to whispered voices and some television programs.  They have deferred audiologic testing in the last year.  They do not  have excessive sun exposure. Discussed the need for sun protection: hats, long sleeves and use of sunscreen if there is significant sun exposure.   Diet: the importance of a healthy diet is discussed. They do have a healthy diet.  The benefits of regular aerobic exercise were discussed. She does not exercise because her job at [employer] is physically tiring. .   Depression screen: there are no signs or vegative symptoms of untreated depression- irritability, change in appetite, anhedonia, sadness/tearfullness.  The following portions of the patient's history were reviewed and updated as appropriate: allergies, current medications, past family history, past medical history,  past surgical history, past social history  and problem list.  Visual acuity was not assessed per patient  preference since she has regular follow up with her ophthalmologist. Hearing and body mass index were assessed and reviewed.   During the course of the visit the patient was educated and counseled about appropriate screening and preventive services including : fall prevention , diabetes screening, nutrition counseling, colorectal cancer screening, and recommended immunizations.    CC: The primary encounter diagnosis was Encounter for screening mammogram for malignant neoplasm of breast. Diagnoses of Colon cancer screening, Neoplasm of skin of forehead, Weight loss, unintentional, Mixed hyperlipidemia, Screening for lung cancer, Atherosclerosis of aorta ([place]), Encounter for preventive health examination, Generalized anxiety disorder with panic attacks, Scalp lesion, and Tobacco abuse were also pertinent to this visit.   1) Nonhealing lesion on forehead:  Needs derm referral.  Scabs over ,  Bleeds,  Never completely resolves  2) history of HTN:  BPs at home < 130/80 without amlodipine  3)   Weight loss:  Reviewed patient's reports/concerns>  Weight is unchanged from 2017.  During her period of unemployment she gained weight due to poor eating habits and inactivity.  Since she started woeking again,  She is eating healthier food and active physically at work.   4) Reviewed findings of prior CT scan today..  Patient is willing to  Resume atorvastatin   5) Tobacco abuse:   Reviewed past attempts to quit, success and failures.     History Sherry Petersen has a past medical history of Anxiety and Hematuria.   She has a past surgical history that includes Carpal tunnel release (2003); Vaginal hysterectomy (2002); and  Tonsillectomy (2012).   Her family history includes COPD (age of onset: 90) in her brother; CVA in her brother; Diabetes in her mother; Early death in her father; Heart attack in her brother and brother; Heart attack (age of onset: 67) in her father; Heart attack (age of onset: 64) in her sister;  Heart disease in her father; Lupus in her sister; Mental illness in her mother.She reports that she has been smoking cigarettes. She has a 7.50 pack-year smoking history. She has never used smokeless tobacco. She reports that she does not drink alcohol and does not use drugs.  Outpatient Medications Prior to Visit  Medication Sig Dispense Refill  . sertraline (ZOLOFT) 50 MG tablet Take 1 tablet (50 mg total) by mouth daily. 90 tablet 0  . ALPRAZolam (XANAX) 0.5 MG tablet TAKE 1 TABLET BY MOUTH AT BEDTIME AS NEEDED FOR ANXIETY 30 tablet 0   No facility-administered medications prior to visit.    Review of Systems  Objective:  BP 126/70 (BP Location: Left Arm, Patient Position: Sitting, Cuff Size: Normal)   Pulse (!) 57   Temp 98.2 F (36.8 C)   Ht 5' 2.01" (1.575 m)   Wt 97 lb 9.6 oz (44.3 kg)   SpO2 98%   BMI 17.85 kg/m   Physical Exam    Assessment & Plan:   Problem List Items Addressed This Visit      Unprioritized   Atherosclerosis of aorta ([place])    Noted on CT scan in 2016,  With ongoing tobacco abuse.  She has no history of statin intolerance: her use of statin has lapsed due to lack of insurance resulting in being lost to follow up.  She is not fasting today but will be advised to  resume medication   Lab Results  Component Value Date   CHOL 183 11/02/2017   HDL 48.10 11/02/2017   LDLCALC 124 (H) 11/02/2017   LDLDIRECT 73 08/11/2016   TRIG 51.0 11/02/2017   CHOLHDL 4 11/02/2017         Relevant Medications   atorvastatin (LIPITOR) 20 MG tablet   Encounter for preventive health examination    age appropriate education and counseling updated, referrals for preventative services and immunizations addressed, dietary and smoking counseling addressed, most recent labs reviewed.  I have personally reviewed and have noted:  1) the patient's medical and social history 2) The pt's use of alcohol, tobacco, and illicit drugs 3) The patient's current medications and  supplements 4) Functional ability including ADL's, fall risk, home safety risk, hearing and visual impairment 5) Diet and physical activities 6) Evidence for depression or mood disorder 7) The patient's height, weight, and BMI have been recorded in the chart  I have made referrals, and provided counseling and education based on review of the above      Generalized anxiety disorder with panic attacks    Symptoms are well controlled on sertraline  50 mg daily with prn alprazolam.  The risks and benefits of benzodiazepine use were again reviewed with patient today including excessive sedation leading to respiratory depression,  impaired thinking/driving, and addiction.  Patient was advised to avoid concurrent use with alcohol, to use medication only as needed and not to share with others  .       Relevant Medications   ALPRAZolam (XANAX) 0.5 MG tablet (Start on 05/23/2021)   Hyperlipidemia   Relevant Medications   atorvastatin (LIPITOR) 20 MG tablet   Other Relevant Orders   Lipid Panel w/reflex Direct  LDL   Comprehensive metabolic panel (Completed)   Neoplasm of skin of forehead    dermatology referral needed   To evaluate suspiciouslesion on forehead that is suggestive of Basal cel;; CA      Relevant Orders   Ambulatory referral to Dermatology   Scalp lesion   Tobacco abuse    Attempts to reduce use supported with encouragement and review of past trials of medications.  CT lung CA screening discussed and ordered        Other Visit Diagnoses    Encounter for screening mammogram for malignant neoplasm of breast    -  Primary   Relevant Orders   MM 3D SCREEN BREAST BILATERAL   Colon cancer screening       Relevant Orders   Ambulatory referral to Gastroenterology   Weight loss, unintentional       Relevant Orders   TSH (Completed)   CBC with Differential/Platelet (Completed)   Screening for lung cancer       Relevant Orders   CT CHEST LUNG CA SCREEN LOW DOSE W/O CM      I  am having Lexys C. Spader maintain her sertraline, ALPRAZolam, and atorvastatin.  Meds ordered this encounter  Medications  . ALPRAZolam (XANAX) 0.5 MG tablet    Sig: TAKE 1 TABLET BY MOUTH AT BEDTIME AS NEEDED FOR ANXIETY    Dispense:  30 tablet    Refill:  5  . atorvastatin (LIPITOR) 20 MG tablet    Sig: TAKE 1 TABLET(20 MG) BY MOUTH DAILY    Dispense:  90 tablet    Refill:  1    Medications Discontinued During This Encounter  Medication Reason  . ALPRAZolam (XANAX) 0.5 MG tablet Reorder    Follow-up: No follow-ups on file.   Crecencio Mc, MD

## 2021-05-03 DIAGNOSIS — D492 Neoplasm of unspecified behavior of bone, soft tissue, and skin: Secondary | ICD-10-CM | POA: Insufficient documentation

## 2021-05-03 MED ORDER — ATORVASTATIN CALCIUM 20 MG PO TABS: ORAL_TABLET | ORAL | 1 refills | Status: DC

## 2021-05-03 NOTE — Assessment & Plan Note (Addendum)
Noted on CT scan in 2016,  With ongoing tobacco abuse.  She has no history of statin intolerance: her use of statin has lapsed due to lack of insurance resulting in being lost to follow up.  She is not fasting today but will be advised to  resume medication   Lab Results  Component Value Date   CHOL 183 11/02/2017   HDL 48.10 11/02/2017   LDLCALC 124 (H) 11/02/2017   LDLDIRECT 73 08/11/2016   TRIG 51.0 11/02/2017   CHOLHDL 4 11/02/2017

## 2021-05-03 NOTE — Assessment & Plan Note (Signed)
Attempts to reduce use supported with encouragement and review of past trials of medications.  CT lung CA screening discussed and ordered

## 2021-05-03 NOTE — Assessment & Plan Note (Signed)
Symptoms are well controlled on sertraline  50 mg daily with prn alprazolam.  The risks and benefits of benzodiazepine use were again reviewed with patient today including excessive sedation leading to respiratory depression,  impaired thinking/driving, and addiction.  Patient was advised to avoid concurrent use with alcohol, to use medication only as needed and not to share with others  .

## 2021-05-03 NOTE — Assessment & Plan Note (Signed)

## 2021-05-03 NOTE — Assessment & Plan Note (Signed)
dermatology referral needed   To evaluate suspiciouslesion on forehead that is suggestive of Basal cel;; CA

## 2021-06-02 ENCOUNTER — Telehealth: Payer: Self-pay | Admitting: *Deleted

## 2021-06-02 NOTE — Telephone Encounter (Signed)
Received referral for low dose lung cancer screening CT scan. Message left at phone number listed in EMR for patient to call me back to facilitate scheduling scan.  

## 2021-07-25 ENCOUNTER — Encounter: Payer: Self-pay | Admitting: Internal Medicine

## 2021-09-16 DIAGNOSIS — H43313 Vitreous membranes and strands, bilateral: Secondary | ICD-10-CM | POA: Diagnosis not present

## 2021-11-04 DIAGNOSIS — R062 Wheezing: Secondary | ICD-10-CM | POA: Diagnosis not present

## 2021-11-04 DIAGNOSIS — J101 Influenza due to other identified influenza virus with other respiratory manifestations: Secondary | ICD-10-CM | POA: Diagnosis not present

## 2021-11-04 DIAGNOSIS — R0789 Other chest pain: Secondary | ICD-10-CM | POA: Diagnosis not present

## 2021-11-04 DIAGNOSIS — R079 Chest pain, unspecified: Secondary | ICD-10-CM | POA: Diagnosis not present

## 2021-11-04 DIAGNOSIS — R001 Bradycardia, unspecified: Secondary | ICD-10-CM | POA: Diagnosis not present

## 2021-11-04 DIAGNOSIS — F17210 Nicotine dependence, cigarettes, uncomplicated: Secondary | ICD-10-CM | POA: Diagnosis not present

## 2021-11-04 DIAGNOSIS — Z20822 Contact with and (suspected) exposure to covid-19: Secondary | ICD-10-CM | POA: Diagnosis not present

## 2021-11-12 ENCOUNTER — Other Ambulatory Visit: Payer: Self-pay | Admitting: Internal Medicine

## 2021-11-12 NOTE — Telephone Encounter (Signed)
RX Refill: xanax Last Seen: 05-01-21 Last Ordered: 05-23-21 Next Appt: NA

## 2021-12-18 ENCOUNTER — Other Ambulatory Visit: Payer: Self-pay | Admitting: Family

## 2021-12-22 ENCOUNTER — Other Ambulatory Visit: Payer: Self-pay | Admitting: Family

## 2021-12-22 NOTE — Telephone Encounter (Signed)
Refilled: 11/14/2021 Last OV: 05/01/2021 Next OV: 01/07/2022

## 2021-12-22 NOTE — Addendum Note (Signed)
Addended by: Adair Laundry on: 12/22/2021 12:55 PM   Modules accepted: Orders

## 2021-12-22 NOTE — Telephone Encounter (Signed)
Patient called and would like her ALPRAZolam (XANAX) 0.5 MG tablet refilled.

## 2021-12-23 ENCOUNTER — Other Ambulatory Visit: Payer: Self-pay | Admitting: Internal Medicine

## 2021-12-23 MED ORDER — ALPRAZOLAM 0.5 MG PO TABS: ORAL_TABLET | ORAL | 0 refills | Status: DC

## 2022-01-07 ENCOUNTER — Ambulatory Visit: Payer: BC Managed Care – PPO | Admitting: Internal Medicine

## 2022-01-07 ENCOUNTER — Encounter: Payer: Self-pay | Admitting: Internal Medicine

## 2022-01-07 ENCOUNTER — Other Ambulatory Visit: Payer: Self-pay

## 2022-01-07 VITALS — BP 170/86 | HR 48 | Temp 98.2°F | Ht 62.0 in | Wt 104.8 lb

## 2022-01-07 DIAGNOSIS — F411 Generalized anxiety disorder: Secondary | ICD-10-CM

## 2022-01-07 DIAGNOSIS — Z79899 Other long term (current) drug therapy: Secondary | ICD-10-CM

## 2022-01-07 DIAGNOSIS — E7800 Pure hypercholesterolemia, unspecified: Secondary | ICD-10-CM | POA: Diagnosis not present

## 2022-01-07 DIAGNOSIS — R030 Elevated blood-pressure reading, without diagnosis of hypertension: Secondary | ICD-10-CM

## 2022-01-07 DIAGNOSIS — R7301 Impaired fasting glucose: Secondary | ICD-10-CM

## 2022-01-07 DIAGNOSIS — E876 Hypokalemia: Secondary | ICD-10-CM

## 2022-01-07 DIAGNOSIS — F410 Panic disorder [episodic paroxysmal anxiety] without agoraphobia: Secondary | ICD-10-CM

## 2022-01-07 DIAGNOSIS — I700 Atherosclerosis of aorta: Secondary | ICD-10-CM

## 2022-01-07 DIAGNOSIS — I10 Essential (primary) hypertension: Secondary | ICD-10-CM

## 2022-01-07 DIAGNOSIS — K2100 Gastro-esophageal reflux disease with esophagitis, without bleeding: Secondary | ICD-10-CM

## 2022-01-07 MED ORDER — AMLODIPINE BESYLATE 2.5 MG PO TABS: 2.5000 mg | ORAL_TABLET | Freq: Every day | ORAL | 1 refills | Status: DC

## 2022-01-07 MED ORDER — OMEPRAZOLE 40 MG PO CPDR: 40.0000 mg | DELAYED_RELEASE_CAPSULE | Freq: Every day | ORAL | 3 refills | Status: DC

## 2022-01-07 MED ORDER — SERTRALINE HCL 50 MG PO TABS: 50.0000 mg | ORAL_TABLET | Freq: Every day | ORAL | 0 refills | Status: DC

## 2022-01-07 MED ORDER — ALPRAZOLAM 0.5 MG PO TABS: ORAL_TABLET | ORAL | 5 refills | Status: DC

## 2022-01-07 NOTE — Progress Notes (Signed)
Subjective:  Patient ID: Sherry Petersen, female    DOB: 09-Jul-1961  Age: 61 y.o. MRN: [id number]  CC: The primary encounter diagnosis was Pure hypercholesterolemia. Diagnoses of Elevated blood pressure reading, Impaired fasting glucose, Long-term use of high-risk medication, Hypokalemia, Generalized anxiety disorder with panic attacks, Atherosclerosis of aorta (HCC), Gastroesophageal reflux disease with esophagitis without hemorrhage, and Essential hypertension were also pertinent to this visit.   This visit occurred during the SARS-CoV-2 public health emergency.  Safety protocols were in place, including screening questions prior to the visit, additional usage of staff PPE, and extensive cleaning of exam room while observing appropriate contact time as indicated for disinfecting solutions.    HPI LATOYNA HIRD presents for follow up on multiple issues  1) Generalized anxiety:  no longer taking zoloft but using alprazolam  to manage  panic attacks that have been occurring nearly every night. husband was hospitalized at ICU in [facility] with with sepsis from an arm wound Jan 1. Husband Getting a skin graft on Friday The risks and benefits of benzodiazepine use were discussed with patient today including excessive sedation leading to respiratory depression,  impaired thinking/driving, and addiction.  Marland Kitchen    2) Health maint:  last mammogram 2019  3)  tobacco abuse: quit smoking in November after ER treated her for influenza.  Lost taste and smell; still altered.  Doesn't want a cigarette.   4) social : working full time at [employer] . Has insurance now.   6) Elevated blood pressure confirmed with home readings.  Stopped amlodipine in 2018  7) Throat feels sore since infection in November ,  hurts at times. No reurgitatiion,  choking or sensation of food getting stuck. Eats late at night after getting off work at [employer] to bed on a full stomach.    Outpatient Medications Prior to  Visit  Medication Sig Dispense Refill   ALPRAZolam (XANAX) 0.5 MG tablet TAKE 1 TABLET BY MOUTH AT BEDTIME AS NEEDED FOR ANXIETY 30 tablet 0   atorvastatin (LIPITOR) 20 MG tablet TAKE 1 TABLET(20 MG) BY MOUTH DAILY (Patient not taking: Reported on 01/07/2022) 90 tablet 1   sertraline (ZOLOFT) 50 MG tablet Take 1 tablet (50 mg total) by mouth daily. (Patient not taking: Reported on 01/07/2022) 90 tablet 0   No facility-administered medications prior to visit.    Review of Systems;  Patient denies headache, fevers, malaise, unintentional weight loss, skin rash, eye pain, sinus congestion and sinus pain, sore throat, dysphagia,  hemoptysis , cough, dyspnea, wheezing, chest pain, palpitations, orthopnea, edema, abdominal pain, nausea, melena, diarrhea, constipation, flank pain, dysuria, hematuria, urinary  Frequency, nocturia, numbness, tingling, seizures,  Focal weakness, Loss of consciousness,  Tremor, insomnia, depression, anxiety, and suicidal ideation.      Objective:  BP (!) 170/86 (BP Location: Left Arm, Patient Position: Sitting, Cuff Size: Normal)    Pulse (!) 48    Temp 98.2 F (36.8 C) (Oral)    Ht 5' 2"  (1.575 m)    Wt 104 lb 12.8 oz (47.5 kg)    SpO2 98%    BMI 19.17 kg/m   BP Readings from Last 3 Encounters:  01/07/22 (!) 170/86  05/01/21 126/70  08/17/18 128/78    Wt Readings from Last 3 Encounters:  01/07/22 104 lb 12.8 oz (47.5 kg)  05/01/21 97 lb 9.6 oz (44.3 kg)  09/25/20 97 lb 6.4 oz (44.2 kg)    General appearance: alert, cooperative and appears  stated age Ears: normal TM's and external ear canals both ears Throat: lips, mucosa, and tongue normal; teeth and gums normal Neck: no adenopathy, no carotid bruit, supple, symmetrical, trachea midline and thyroid not enlarged, symmetric, no tenderness/mass/nodules Back: symmetric, no curvature. ROM normal. No CVA tenderness. Lungs: clear to auscultation bilaterally Heart: regular rate and rhythm, S1, S2 normal, no murmur,  click, rub or gallop Abdomen: soft, non-tender; bowel sounds normal; no masses,  no organomegaly Pulses: 2+ and symmetric Skin: Skin color, texture, turgor normal. No rashes or lesions Lymph nodes: Cervical, supraclavicular, and axillary nodes normal.  Lab Results  Component Value Date   HGBA1C 6.4 03/14/2012    Lab Results  Component Value Date   CREATININE 0.79 01/07/2022   CREATININE 0.71 05/01/2021   CREATININE 0.73 11/02/2017    Lab Results  Component Value Date   WBC 5.3 01/07/2022   HGB 13.6 01/07/2022   HCT 41.8 01/07/2022   PLT 237.0 01/07/2022   GLUCOSE 60 (L) 01/07/2022   CHOL 227 (H) 01/07/2022   TRIG 82.0 01/07/2022   HDL 78.90 01/07/2022   LDLDIRECT 73 08/11/2016   LDLCALC 132 (H) 01/07/2022   ALT 11 01/07/2022   AST 21 01/07/2022   NA 144 01/07/2022   K 4.4 01/07/2022   CL 109 01/07/2022   CREATININE 0.79 01/07/2022   BUN 20 01/07/2022   CO2 25 01/07/2022   TSH 0.96 01/07/2022   HGBA1C 6.4 03/14/2012   MICROALBUR <0.7 01/07/2022    CT RENAL STONE STUDY  Result Date: 03/30/2018 CLINICAL DATA:  Gross hematuria. EXAM: CT ABDOMEN AND PELVIS WITHOUT CONTRAST TECHNIQUE: Multidetector CT imaging of the abdomen and pelvis was performed following the standard protocol without IV contrast. COMPARISON:  CT scan of September 02, 2015. FINDINGS: Lower chest: Mild left basilar subsegmental atelectasis is noted. Hepatobiliary: No gallstones are noted. Stable small left hepatic cyst. No other abnormality seen in the liver. No biliary dilatation is noted. Pancreas: Unremarkable. No pancreatic ductal dilatation or surrounding inflammatory changes. Spleen: Normal in size without focal abnormality. Adrenals/Urinary Tract: Adrenal glands are unremarkable. Kidneys are normal, without renal calculi, focal lesion, or hydronephrosis. Bladder is unremarkable. Stomach/Bowel: The stomach and appendix appear normal. No bowel dilatation is noted. Wall thickening of the cecum is noted  with mild surrounding inflammation suggesting mild colitis. Vascular/Lymphatic: Aortic atherosclerosis. No enlarged abdominal or pelvic lymph nodes. Reproductive: Status post hysterectomy. No adnexal masses. Other: No abdominal wall hernia or abnormality. No abdominopelvic ascites. Musculoskeletal: No acute or significant osseous findings. IMPRESSION: Mild wall thickening of the cecum with mild surrounding inflammation is noted suggesting mild infectious or inflammatory colitis. No hydronephrosis or renal obstruction is noted. No renal or ureteral calculi are noted. Aortic Atherosclerosis (ICD10-I70.0). Electronically Signed   By: Marijo Conception, M.D.   On: 03/30/2018 14:55    Assessment & Plan:   Problem List Items Addressed This Visit     Generalized anxiety disorder with panic attacks    Advised to resume sertraline and stop relying on alprazolam.       Relevant Medications   sertraline (ZOLOFT) 50 MG tablet   ALPRAZolam (XANAX) 0.5 MG tablet   Essential hypertension    Elevated today.  Resume amlodipine 2.5 mg  Daily       Relevant Medications   amLODipine (NORVASC) 2.5 MG tablet   atorvastatin (LIPITOR) 20 MG tablet   Hyperlipidemia - Primary   Relevant Medications   amLODipine (NORVASC) 2.5 MG tablet   atorvastatin (LIPITOR) 20 MG  tablet   Other Relevant Orders   Lipid Profile (Completed)   Atherosclerosis of aorta ([place])    Noted on CT scan in 2016,  With ongoing tobacco abuse until several months ago. Her use of statin has lapsed due to lack of insurance resulting in being lost to follow up.  She is fasting today but will be advised to  resume medication   Lab Results  Component Value Date   CHOL 227 (H) 01/07/2022   HDL 78.90 01/07/2022   LDLCALC 132 (H) 01/07/2022   LDLDIRECT 73 08/11/2016   TRIG 82.0 01/07/2022   CHOLHDL 3 01/07/2022         Relevant Medications   amLODipine (NORVASC) 2.5 MG tablet   atorvastatin (LIPITOR) 20 MG tablet   Reflux esophagitis     Starting PPI  For globus symptoms without dysphagia .  If no improvement after 2 weeks..  GI referral       Other Visit Diagnoses     Elevated blood pressure reading       Relevant Orders   Comp Met (CMET) (Completed)   Urine Microalbumin w/creat. ratio (Completed)   Impaired fasting glucose       Long-term use of high-risk medication       Relevant Orders   CBC with Differential/Platelet (Completed)   TSH (Completed)   Comp Met (CMET) (Completed)   Hypokalemia       Relevant Orders   Magnesium (Completed)       I spent 30 minutes dedicated to the care of this patient on the date of this encounter to include pre-visit review of patient's medical history,  most recent imaging studies, Face-to-face time with the patient , and post visit ordering of testing and therapeutics.    Follow-up: No follow-ups on file.   Crecencio Mc, MD

## 2022-01-07 NOTE — Patient Instructions (Signed)
1)  Resume amlodipine 2.5 mg daily for blood pressure  Goal is 130/70 after one week of therapy.  If still > 130/80,  double your dose to 5 mg daily   2)  For the throat:  Start omeprazole 40 mg daily.  This is a PPI (proton pump inhibitor).  If the insurance won't pay,  you can buy the 20 mg dose over the counter and take 2 instead   3)  If no improvement after 2 weeks of omeprazole, call office  Resume zoloft to help manage your panic attacks.

## 2022-01-08 DIAGNOSIS — K2100 Gastro-esophageal reflux disease with esophagitis, without bleeding: Secondary | ICD-10-CM | POA: Insufficient documentation

## 2022-01-08 LAB — CBC WITH DIFFERENTIAL/PLATELET
Basophils Absolute: 0 10*3/uL (ref 0.0–0.1)
Basophils Relative: 0.9 % (ref 0.0–3.0)
Eosinophils Absolute: 0.1 10*3/uL (ref 0.0–0.7)
Eosinophils Relative: 2.3 % (ref 0.0–5.0)
HCT: 41.8 % (ref 36.0–46.0)
Hemoglobin: 13.6 g/dL (ref 12.0–15.0)
Lymphocytes Relative: 36.1 % (ref 12.0–46.0)
Lymphs Abs: 1.9 10*3/uL (ref 0.7–4.0)
MCHC: 32.6 g/dL (ref 30.0–36.0)
MCV: 96.1 fl (ref 78.0–100.0)
Monocytes Absolute: 0.5 10*3/uL (ref 0.1–1.0)
Monocytes Relative: 8.8 % (ref 3.0–12.0)
Neutro Abs: 2.7 10*3/uL (ref 1.4–7.7)
Neutrophils Relative %: 51.9 % (ref 43.0–77.0)
Platelets: 237 10*3/uL (ref 150.0–400.0)
RBC: 4.35 Mil/uL (ref 3.87–5.11)
RDW: 13.7 % (ref 11.5–15.5)
WBC: 5.3 10*3/uL (ref 4.0–10.5)

## 2022-01-08 LAB — MICROALBUMIN / CREATININE URINE RATIO
Creatinine,U: 30.5 mg/dL
Microalb Creat Ratio: 2.3 mg/g (ref 0.0–30.0)
Microalb, Ur: <0.7 mg/dL (ref 0.0–1.9)

## 2022-01-08 LAB — COMPREHENSIVE METABOLIC PANEL WITH GFR
ALT: 11 U/L (ref 0–35)
AST: 21 U/L (ref 0–37)
Albumin: 4.5 g/dL (ref 3.5–5.2)
Alkaline Phosphatase: 78 U/L (ref 39–117)
BUN: 20 mg/dL (ref 6–23)
CO2: 25 meq/L (ref 19–32)
Calcium: 9.2 mg/dL (ref 8.4–10.5)
Chloride: 109 meq/L (ref 96–112)
Creatinine, Ser: 0.79 mg/dL (ref 0.40–1.20)
GFR: 79.83 mL/min
Glucose, Bld: 60 mg/dL — ABNORMAL LOW (ref 70–99)
Potassium: 4.4 meq/L (ref 3.5–5.1)
Sodium: 144 meq/L (ref 135–145)
Total Bilirubin: 0.3 mg/dL (ref 0.2–1.2)
Total Protein: 6.6 g/dL (ref 6.0–8.3)

## 2022-01-08 LAB — LIPID PANEL
Cholesterol: 227 mg/dL — ABNORMAL HIGH (ref 0–200)
HDL: 78.9 mg/dL (ref >39.00)
LDL Cholesterol: 132 mg/dL — ABNORMAL HIGH (ref 0–99)
NonHDL: 148.52
Total CHOL/HDL Ratio: 3
Triglycerides: 82 mg/dL (ref 0.0–149.0)
VLDL: 16.4 mg/dL (ref 0.0–40.0)

## 2022-01-08 LAB — TSH: TSH: 0.96 u[IU]/mL (ref 0.35–5.50)

## 2022-01-08 LAB — MAGNESIUM: Magnesium: 2.1 mg/dL (ref 1.5–2.5)

## 2022-01-08 MED ORDER — ATORVASTATIN CALCIUM 20 MG PO TABS: ORAL_TABLET | ORAL | 1 refills | Status: DC

## 2022-01-08 NOTE — Assessment & Plan Note (Signed)
Advised to resume sertraline and stop relying on alprazolam.

## 2022-01-08 NOTE — Assessment & Plan Note (Signed)
Elevated today.  Resume amlodipine 2.5 mg  Daily

## 2022-01-08 NOTE — Assessment & Plan Note (Signed)
Starting PPI  For globus symptoms without dysphagia .  If no improvement after 2 weeks..  GI referral

## 2022-01-08 NOTE — Assessment & Plan Note (Signed)
Noted on CT scan in 2016,  With ongoing tobacco abuse until several months ago. Her use of statin has lapsed due to lack of insurance resulting in being lost to follow up.  She is fasting today but will be advised to  resume medication   Lab Results  Component Value Date   CHOL 227 (H) 01/07/2022   HDL 78.90 01/07/2022   LDLCALC 132 (H) 01/07/2022   LDLDIRECT 73 08/11/2016   TRIG 82.0 01/07/2022   CHOLHDL 3 01/07/2022

## 2022-06-18 DIAGNOSIS — H43313 Vitreous membranes and strands, bilateral: Secondary | ICD-10-CM | POA: Diagnosis not present

## 2022-07-20 ENCOUNTER — Other Ambulatory Visit: Payer: Self-pay | Admitting: Internal Medicine

## 2022-08-03 ENCOUNTER — Ambulatory Visit: Payer: BC Managed Care – PPO | Admitting: Internal Medicine

## 2022-08-03 ENCOUNTER — Encounter: Payer: Self-pay | Admitting: Internal Medicine

## 2022-08-03 VITALS — BP 142/80 | HR 46 | Temp 97.9°F | Ht 62.0 in | Wt 113.0 lb

## 2022-08-03 DIAGNOSIS — E538 Deficiency of other specified B group vitamins: Secondary | ICD-10-CM

## 2022-08-03 DIAGNOSIS — I700 Atherosclerosis of aorta: Secondary | ICD-10-CM

## 2022-08-03 DIAGNOSIS — Z1231 Encounter for screening mammogram for malignant neoplasm of breast: Secondary | ICD-10-CM

## 2022-08-03 DIAGNOSIS — K5909 Other constipation: Secondary | ICD-10-CM

## 2022-08-03 DIAGNOSIS — I10 Essential (primary) hypertension: Secondary | ICD-10-CM | POA: Diagnosis not present

## 2022-08-03 DIAGNOSIS — R635 Abnormal weight gain: Secondary | ICD-10-CM | POA: Diagnosis not present

## 2022-08-03 DIAGNOSIS — K2970 Gastritis, unspecified, without bleeding: Secondary | ICD-10-CM | POA: Insufficient documentation

## 2022-08-03 DIAGNOSIS — F411 Generalized anxiety disorder: Secondary | ICD-10-CM

## 2022-08-03 DIAGNOSIS — R001 Bradycardia, unspecified: Secondary | ICD-10-CM

## 2022-08-03 DIAGNOSIS — E7800 Pure hypercholesterolemia, unspecified: Secondary | ICD-10-CM | POA: Diagnosis not present

## 2022-08-03 DIAGNOSIS — R1013 Epigastric pain: Secondary | ICD-10-CM

## 2022-08-03 DIAGNOSIS — F410 Panic disorder [episodic paroxysmal anxiety] without agoraphobia: Secondary | ICD-10-CM

## 2022-08-03 DIAGNOSIS — R631 Polydipsia: Secondary | ICD-10-CM

## 2022-08-03 DIAGNOSIS — Z1211 Encounter for screening for malignant neoplasm of colon: Secondary | ICD-10-CM

## 2022-08-03 LAB — COMPREHENSIVE METABOLIC PANEL
ALT: 12 U/L (ref 0–35)
AST: 22 U/L (ref 0–37)
Albumin: 4.6 g/dL (ref 3.5–5.2)
Alkaline Phosphatase: 67 U/L (ref 39–117)
BUN: 16 mg/dL (ref 6–23)
CO2: 26 mEq/L (ref 19–32)
Calcium: 9.5 mg/dL (ref 8.4–10.5)
Chloride: 107 mEq/L (ref 96–112)
Creatinine, Ser: 0.87 mg/dL (ref 0.40–1.20)
GFR: 70.82 mL/min (ref >60.00)
Glucose, Bld: 88 mg/dL (ref 70–99)
Potassium: 4.2 mEq/L (ref 3.5–5.1)
Sodium: 144 mEq/L (ref 135–145)
Total Bilirubin: 0.3 mg/dL (ref 0.2–1.2)
Total Protein: 6.6 g/dL (ref 6.0–8.3)

## 2022-08-03 LAB — HEMOGLOBIN A1C: Hgb A1c MFr Bld: 6.6 % — ABNORMAL HIGH (ref 4.6–6.5)

## 2022-08-03 LAB — B12 AND FOLATE PANEL
Folate: 7.2 ng/mL (ref >5.9)
Vitamin B-12: 285 pg/mL (ref 211–911)

## 2022-08-03 LAB — LIPID PANEL
Cholesterol: 246 mg/dL — ABNORMAL HIGH (ref 0–200)
HDL: 78.6 mg/dL (ref >39.00)
LDL Cholesterol: 154 mg/dL — ABNORMAL HIGH (ref 0–99)
NonHDL: 167.01
Total CHOL/HDL Ratio: 3
Triglycerides: 64 mg/dL (ref 0.0–149.0)
VLDL: 12.8 mg/dL (ref 0.0–40.0)

## 2022-08-03 LAB — TSH: TSH: 1.03 u[IU]/mL (ref 0.35–5.50)

## 2022-08-03 LAB — LDL CHOLESTEROL, DIRECT: Direct LDL: 141 mg/dL

## 2022-08-03 MED ORDER — ALPRAZOLAM 0.5 MG PO TABS
ORAL_TABLET | ORAL | 5 refills | Status: DC
Start: 2022-08-03 — End: 2023-02-20

## 2022-08-03 MED ORDER — SERTRALINE HCL 100 MG PO TABS: 100.0000 mg | ORAL_TABLET | Freq: Every day | ORAL | 1 refills | Status: DC

## 2022-08-03 MED ORDER — PANTOPRAZOLE SODIUM 40 MG PO TBEC: 40.0000 mg | DELAYED_RELEASE_TABLET | Freq: Every day | ORAL | 3 refills | Status: DC

## 2022-08-03 NOTE — Progress Notes (Signed)
Subjective:  Patient ID: Sherry Petersen, female    DOB: 04/20/1961  Age: 61 y.o. MRN: [id number]  CC: The primary encounter diagnosis was Colon cancer screening. Diagnoses of Essential hypertension, Pure hypercholesterolemia, Encounter for screening mammogram for malignant neoplasm of breast, Polydipsia, Weight gain, Epigastric pain, Bradycardia, B12 deficiency, Constipation, chronic, Atherosclerosis of aorta (HCC), and Generalized anxiety disorder with panic attacks were also pertinent to this visit.   HPI Sherry Petersen presents for follow u pon multiple issues  Chief Complaint  Patient presents with   Follow-up    Follow up for medication refills    1) epigastric pain  no longer taking  omeprazole so she stopped it several months ago and did not follow up with me.  Her pain is constant but aggravated by eating (anything).  Does not  use NSAIDS  or alcohol , Denies black stool, loss of appetite.   2) "I think I have an electrolyte imbalance"  thirsty all the time,   legs cramping,  often feels off balance.  Drinks water constantly even at night.  Gaining weight : has gained 16 lbs since October (since she quit smoking)   3)  HTN:  taking amlodipine.  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  Makes her anxious when lines are long.  Taking sertraline 50 mg and has on occasion used her nighttime alprazolam     Outpatient Medications Prior to Visit  Medication Sig Dispense Refill   amLODipine (NORVASC) 2.5 MG tablet Take 1 tablet (2.5 mg total) by mouth daily. 90 tablet 1   atorvastatin (LIPITOR) 20 MG tablet TAKE 1 TABLET(20 MG) BY MOUTH DAILY 90 tablet 1   ALPRAZolam (XANAX) 0.5 MG tablet TAKE 1 TABLET BY MOUTH AT BEDTIME AS NEEDED FOR ANXIETY 30 tablet 0   omeprazole (PRILOSEC) 40 MG capsule Take 1 capsule (40 mg total) by mouth daily. 30 capsule 3   sertraline (ZOLOFT) 50 MG tablet Take 1 tablet (50 mg total) by mouth daily. 90 tablet 0   No  facility-administered medications prior to visit.    Review of Systems;  Patient denies headache, fevers, malaise, unintentional weight loss, skin rash, eye pain, sinus congestion and sinus pain, sore throat, dysphagia,  hemoptysis , cough, dyspnea, wheezing, chest pain, palpitations, orthopnea, edema, abdominal pain, nausea, melena, diarrhea, constipation, flank pain, dysuria, hematuria, urinary  Frequency, nocturia, numbness, tingling, seizures,  Focal weakness, Loss of consciousness,  Tremor, insomnia, depression, anxiety, and suicidal ideation.      Objective:  BP (!) 142/80 (BP Location: Left Arm, Patient Position: Sitting, Cuff Size: Normal)   Pulse (!) 46   Temp 97.9 F (36.6 C) (Oral)   Ht 5' 2"  (1.575 m)   Wt 113 lb (51.3 kg)   SpO2 97%   BMI 20.67 kg/m   BP Readings from Last 3 Encounters:  08/03/22 (!) 142/80  01/07/22 (!) 170/86  05/01/21 126/70    Wt Readings from Last 3 Encounters:  08/03/22 113 lb (51.3 kg)  01/07/22 104 lb 12.8 oz (47.5 kg)  05/01/21 97 lb 9.6 oz (44.3 kg)    General appearance: alert, cooperative and appears stated age Ears: normal TM's and external ear canals both ears Throat: lips, mucosa, and tongue normal; teeth and gums normal Neck: no adenopathy, no carotid bruit, supple, symmetrical, trachea midline and thyroid not enlarged, symmetric, no tenderness/mass/nodules Back: symmetric, no curvature. ROM normal. No CVA tenderness. Lungs: clear to  auscultation bilaterally Heart: regular rate and rhythm, S1, S2 normal, no murmur, click, rub or gallop Abdomen: soft, non-tender; bowel sounds normal; no masses,  no organomegaly Pulses: 2+ and symmetric Skin: Skin color, texture, turgor normal. No rashes or lesions Lymph nodes: Cervical, supraclavicular, and axillary nodes normal.  Lab Results  Component Value Date   HGBA1C 6.4 03/14/2012    Lab Results  Component Value Date   CREATININE 0.79 01/07/2022   CREATININE 0.71 05/01/2021    CREATININE 0.73 11/02/2017    Lab Results  Component Value Date   WBC 5.3 01/07/2022   HGB 13.6 01/07/2022   HCT 41.8 01/07/2022   PLT 237.0 01/07/2022   GLUCOSE 60 (L) 01/07/2022   CHOL 227 (H) 01/07/2022   TRIG 82.0 01/07/2022   HDL 78.90 01/07/2022   LDLDIRECT 73 08/11/2016   LDLCALC 132 (H) 01/07/2022   ALT 11 01/07/2022   AST 21 01/07/2022   NA 144 01/07/2022   K 4.4 01/07/2022   CL 109 01/07/2022   CREATININE 0.79 01/07/2022   BUN 20 01/07/2022   CO2 25 01/07/2022   TSH 0.96 01/07/2022   HGBA1C 6.4 03/14/2012   MICROALBUR <0.7 01/07/2022    CT RENAL STONE STUDY  Result Date: 03/30/2018 CLINICAL DATA:  Gross hematuria. EXAM: CT ABDOMEN AND PELVIS WITHOUT CONTRAST TECHNIQUE: Multidetector CT imaging of the abdomen and pelvis was performed following the standard protocol without IV contrast. COMPARISON:  CT scan of September 02, 2015. FINDINGS: Lower chest: Mild left basilar subsegmental atelectasis is noted. Hepatobiliary: No gallstones are noted. Stable small left hepatic cyst. No other abnormality seen in the liver. No biliary dilatation is noted. Pancreas: Unremarkable. No pancreatic ductal dilatation or surrounding inflammatory changes. Spleen: Normal in size without focal abnormality. Adrenals/Urinary Tract: Adrenal glands are unremarkable. Kidneys are normal, without renal calculi, focal lesion, or hydronephrosis. Bladder is unremarkable. Stomach/Bowel: The stomach and appendix appear normal. No bowel dilatation is noted. Wall thickening of the cecum is noted with mild surrounding inflammation suggesting mild colitis. Vascular/Lymphatic: Aortic atherosclerosis. No enlarged abdominal or pelvic lymph nodes. Reproductive: Status post hysterectomy. No adnexal masses. Other: No abdominal wall hernia or abnormality. No abdominopelvic ascites. Musculoskeletal: No acute or significant osseous findings. IMPRESSION: Mild wall thickening of the cecum with mild surrounding inflammation is  noted suggesting mild infectious or inflammatory colitis. No hydronephrosis or renal obstruction is noted. No renal or ureteral calculi are noted. Aortic Atherosclerosis (ICD10-I70.0). Electronically Signed   By: Marijo Conception, M.D.   On: 03/30/2018 14:55    Assessment & Plan:   Problem List Items Addressed This Visit     Atherosclerosis of aorta ([place])    Noted on CT scan in 2016,  With ongoing tobacco abuse until several months ago. Her use of statin had lapsed due to lack of insurance resulting in being lost to follow up.  She has resumed atorvastatin  Lab Results  Component Value Date   CHOL 227 (H) 01/07/2022   HDL 78.90 01/07/2022   LDLCALC 132 (H) 01/07/2022   LDLDIRECT 73 08/11/2016   TRIG 82.0 01/07/2022   CHOLHDL 3 01/07/2022         B12 deficiency    rechecking level today       Relevant Orders   B12 and Folate Panel   Bradycardia    Chronic, worked up in the past by cardiology with Holter monitor. resopnds appropriately to exertion       Constipation, chronic    Advised to add Benefiber  daily       Epigastric pain    Resuming PPI therapy with pantoprazole. . Checking H Pylori,  If, no imporvement in 4 weeks.  GI referral for EGD needed       Relevant Orders   H Pylori, IGM, IGG, IGA AB   Essential hypertension    Does not check at home.  2nd reading today is at goal on current amlodipine dose      Relevant Orders   Comp Met (CMET)   Generalized anxiety disorder with panic attacks    Advised to increase sertraline dose to 100 mg daily and limit use of alprazolam to once daily.   The risks and benefits of benzodiazepine use were discussed with patient today including excessive sedation leading to respiratory depression,  impaired thinking/driving, and addiction.  Patient was advised to avoid concurrent use with alcohol, to use medication only as needed and not to share with others  .       Relevant Medications   sertraline (ZOLOFT) 100 MG tablet    ALPRAZolam (XANAX) 0.5 MG tablet   Hyperlipidemia   Relevant Orders   Lipid Profile   Direct LDL   Weight gain    Significant given her history of chronic underweight.  Likely due to smoking cessation following COVID infection. Checking TSH and a1c       Relevant Orders   TSH   Other Visit Diagnoses     Colon cancer screening    -  Primary   Relevant Orders   Ambulatory referral to Gastroenterology   Encounter for screening mammogram for malignant neoplasm of breast       Relevant Orders   MM 3D SCREEN BREAST BILATERAL   Polydipsia       Relevant Orders   Hemoglobin A1c       I spent a total of  39 minutes with this patient in a face to face visit on the date of this encounter reviewing the last office visit with me in January,   patient's diet and eating habits, home blood pressure readings ,  most recent imaging study ,   and post visit ordering of testing and therapeutics.    Follow-up: No follow-ups on file.   Crecencio Mc, MD

## 2022-08-03 NOTE — Patient Instructions (Signed)
Start taking a dose of Benefiber daily in your water for the constipation  Start taking pantoprazole for the .  I will make a Referral for endoscopy if no improvement  in 2 weeks  if the pain has not improved  (SO LET ME KNOW)   INCREASE sertraline to 100 mg daily for anxiety  Limit alprazolam to once daily ; IT IS ADDICTING

## 2022-08-03 NOTE — Assessment & Plan Note (Signed)
Does not check at home.  2nd reading today is at goal on current amlodipine dose 

## 2022-08-03 NOTE — Assessment & Plan Note (Signed)
Advised to increase sertraline dose to 100 mg daily and limit use of alprazolam to once daily.   The risks and benefits of benzodiazepine use were discussed with patient today including excessive sedation leading to respiratory depression,  impaired thinking/driving, and addiction.  Patient was advised to avoid concurrent use with alcohol, to use medication only as needed and not to share with others  .

## 2022-08-03 NOTE — Assessment & Plan Note (Signed)
Resuming PPI therapy with pantoprazole. . Checking H Pylori,  If, no imporvement in 4 weeks.  GI referral for EGD needed

## 2022-08-03 NOTE — Assessment & Plan Note (Signed)
rechecking level today

## 2022-08-03 NOTE — Assessment & Plan Note (Addendum)
Advised to add Benefiber daily

## 2022-08-03 NOTE — Assessment & Plan Note (Signed)
Noted on CT scan in 2016,  With ongoing tobacco abuse until several months ago. Her use of statin had lapsed due to lack of insurance resulting in being lost to follow up.  She has resumed atorvastatin  Lab Results  Component Value Date   CHOL 227 (H) 01/07/2022   HDL 78.90 01/07/2022   LDLCALC 132 (H) 01/07/2022   LDLDIRECT 73 08/11/2016   TRIG 82.0 01/07/2022   CHOLHDL 3 01/07/2022

## 2022-08-03 NOTE — Assessment & Plan Note (Signed)
Chronic, worked up in the past by cardiology with Holter monitor. resopnds appropriately to exertion

## 2022-08-03 NOTE — Assessment & Plan Note (Signed)
Significant given her history of chronic underweight.  Likely due to smoking cessation following COVID infection. Checking TSH and a1c

## 2022-08-04 ENCOUNTER — Other Ambulatory Visit: Payer: Self-pay | Admitting: Internal Medicine

## 2022-08-04 DIAGNOSIS — E1169 Type 2 diabetes mellitus with other specified complication: Secondary | ICD-10-CM | POA: Insufficient documentation

## 2022-08-04 MED ORDER — ATORVASTATIN CALCIUM 40 MG PO TABS
40.0000 mg | ORAL_TABLET | Freq: Every day | ORAL | 1 refills | Status: DC
Start: 2022-08-04 — End: 2023-04-21

## 2022-08-04 NOTE — Assessment & Plan Note (Signed)
By a1c.  appt needed.  Atorvastatin dose increased to 40 mg daily

## 2022-08-06 LAB — H PYLORI, IGM, IGG, IGA AB
H pylori, IgM Abs: <9 units (ref 0.0–8.9)
H. pylori, IgA Abs: <9 units (ref 0.0–8.9)
H. pylori, IgG AbS: 0.14 Index Value (ref 0.00–0.79)

## 2022-08-19 ENCOUNTER — Ambulatory Visit: Payer: BC Managed Care – PPO | Admitting: Internal Medicine

## 2022-08-19 ENCOUNTER — Encounter: Payer: Self-pay | Admitting: Internal Medicine

## 2022-08-19 VITALS — BP 162/88 | HR 51 | Temp 98.0°F | Ht 62.0 in | Wt 111.8 lb

## 2022-08-19 DIAGNOSIS — E1169 Type 2 diabetes mellitus with other specified complication: Secondary | ICD-10-CM

## 2022-08-19 DIAGNOSIS — E782 Mixed hyperlipidemia: Secondary | ICD-10-CM

## 2022-08-19 LAB — MICROALBUMIN / CREATININE URINE RATIO
Creatinine,U: 47.8 mg/dL
Microalb Creat Ratio: 1.5 mg/g (ref 0.0–30.0)
Microalb, Ur: <0.7 mg/dL (ref 0.0–1.9)

## 2022-08-19 NOTE — Progress Notes (Signed)
Subjective:  Patient ID: Sherry Petersen, female    DOB: 02-09-1961  Age: 61 y.o. MRN: [id number]  CC: The encounter diagnosis was DM type 2 with diabetic mixed hyperlipidemia ([place]).   HPI Sherry Petersen presents for  Chief Complaint  Patient presents with   Follow-up    Follow up on new onset diabetes    61 yr old female with history of ongoing tobacco abuse, hyperlipidemia and hypertension presents after recent screening noted an a1c of 6.6   Lab Results  Component Value Date   HGBA1C 6.6 (H) 08/03/2022   Weight gain:  she Quit smoking in November after getting COVID ,gained 10 lbs.    reviewed diet :  cheese toast or cereal for breakfast.  lunch is apack of nabs.  And diinner  is usually chicken,  broccoli    Outpatient Medications Prior to Visit  Medication Sig Dispense Refill   ALPRAZolam (XANAX) 0.5 MG tablet TAKE 1 TABLET BY MOUTH AT BEDTIME AS NEEDED FOR ANXIETY 30 tablet 5   amLODipine (NORVASC) 2.5 MG tablet Take 1 tablet (2.5 mg total) by mouth daily. 90 tablet 1   atorvastatin (LIPITOR) 40 MG tablet Take 1 tablet (40 mg total) by mouth daily. 90 tablet 1   pantoprazole (PROTONIX) 40 MG tablet Take 1 tablet (40 mg total) by mouth daily. 30 tablet 3   sertraline (ZOLOFT) 100 MG tablet Take 1 tablet (100 mg total) by mouth daily. 90 tablet 1   No facility-administered medications prior to visit.    Review of Systems;  Patient denies headache, fevers, malaise, unintentional weight loss, skin rash, eye pain, sinus congestion and sinus pain, sore throat, dysphagia,  hemoptysis , cough, dyspnea, wheezing, chest pain, palpitations, orthopnea, edema, abdominal pain, nausea, melena, diarrhea, constipation, flank pain, dysuria, hematuria, urinary  Frequency, nocturia, numbness, tingling, seizures,  Focal weakness, Loss of consciousness,  Tremor, insomnia, depression, anxiety, and suicidal ideation.      Objective:  BP (!) 162/88 (BP Location: Left Arm, Patient Position:  Sitting, Cuff Size: Normal)   Pulse (!) 51   Temp 98 F (36.7 C) (Oral)   Ht '5\' 2"'$  (1.575 m)   Wt 111 lb 12.8 oz (50.7 kg)   SpO2 98%   BMI 20.45 kg/m   BP Readings from Last 3 Encounters:  08/19/22 (!) 162/88  08/03/22 (!) 142/80  01/07/22 (!) 170/86    Wt Readings from Last 3 Encounters:  08/19/22 111 lb 12.8 oz (50.7 kg)  08/03/22 113 lb (51.3 kg)  01/07/22 104 lb 12.8 oz (47.5 kg)    General appearance: alert, cooperative and appears stated age Ears: normal TM's and external ear canals both ears Throat: lips, mucosa, and tongue normal; teeth and gums normal Neck: no adenopathy, no carotid bruit, supple, symmetrical, trachea midline and thyroid not enlarged, symmetric, no tenderness/mass/nodules Back: symmetric, no curvature. ROM normal. No CVA tenderness. Lungs: clear to auscultation bilaterally Heart: regular rate and rhythm, S1, S2 normal, no murmur, click, rub or gallop Abdomen: soft, non-tender; bowel sounds normal; no masses,  no organomegaly Pulses: 2+ and symmetric Skin: Skin color, texture, turgor normal. No rashes or lesions Lymph nodes: Cervical, supraclavicular, and axillary nodes normal.  Lab Results  Component Value Date   HGBA1C 6.6 (H) 08/03/2022   HGBA1C 6.4 03/14/2012    Lab Results  Component Value Date   CREATININE 0.87 08/03/2022   CREATININE 0.79 01/07/2022   CREATININE 0.71 05/01/2021    Lab Results  Component Value Date  WBC 5.3 01/07/2022   HGB 13.6 01/07/2022   HCT 41.8 01/07/2022   PLT 237.0 01/07/2022   GLUCOSE 88 08/03/2022   CHOL 246 (H) 08/03/2022   TRIG 64.0 08/03/2022   HDL 78.60 08/03/2022   LDLDIRECT 141.0 08/03/2022   LDLCALC 154 (H) 08/03/2022   ALT 12 08/03/2022   AST 22 08/03/2022   NA 144 08/03/2022   K 4.2 08/03/2022   CL 107 08/03/2022   CREATININE 0.87 08/03/2022   BUN 16 08/03/2022   CO2 26 08/03/2022   TSH 1.03 08/03/2022   HGBA1C 6.6 (H) 08/03/2022   MICROALBUR <0.7 08/19/2022    CT RENAL STONE  STUDY  Result Date: 03/30/2018 CLINICAL DATA:  Gross hematuria. EXAM: CT ABDOMEN AND PELVIS WITHOUT CONTRAST TECHNIQUE: Multidetector CT imaging of the abdomen and pelvis was performed following the standard protocol without IV contrast. COMPARISON:  CT scan of September 02, 2015. FINDINGS: Lower chest: Mild left basilar subsegmental atelectasis is noted. Hepatobiliary: No gallstones are noted. Stable small left hepatic cyst. No other abnormality seen in the liver. No biliary dilatation is noted. Pancreas: Unremarkable. No pancreatic ductal dilatation or surrounding inflammatory changes. Spleen: Normal in size without focal abnormality. Adrenals/Urinary Tract: Adrenal glands are unremarkable. Kidneys are normal, without renal calculi, focal lesion, or hydronephrosis. Bladder is unremarkable. Stomach/Bowel: The stomach and appendix appear normal. No bowel dilatation is noted. Wall thickening of the cecum is noted with mild surrounding inflammation suggesting mild colitis. Vascular/Lymphatic: Aortic atherosclerosis. No enlarged abdominal or pelvic lymph nodes. Reproductive: Status post hysterectomy. No adnexal masses. Other: No abdominal wall hernia or abnormality. No abdominopelvic ascites. Musculoskeletal: No acute or significant osseous findings. IMPRESSION: Mild wall thickening of the cecum with mild surrounding inflammation is noted suggesting mild infectious or inflammatory colitis. No hydronephrosis or renal obstruction is noted. No renal or ureteral calculi are noted. Aortic Atherosclerosis (ICD10-I70.0). Electronically Signed   By: Marijo Conception, M.D.   On: 03/30/2018 14:55    Assessment & Plan:   Problem List Items Addressed This Visit     DM type 2 with diabetic mixed hyperlipidemia ([place]) - Primary    counselling given   Standards of care outlined,  dietary modifications recommended . Continue statin   Lab Results  Component Value Date   HGBA1C 6.6 (H) 08/03/2022   Lab Results  Component  Value Date   MICROALBUR <0.7 08/19/2022   MICROALBUR <0.7 01/07/2022   Lab Results  Component Value Date   CHOL 246 (H) 08/03/2022   HDL 78.60 08/03/2022   LDLCALC 154 (H) 08/03/2022   LDLDIRECT 141.0 08/03/2022   TRIG 64.0 08/03/2022   CHOLHDL 3 08/03/2022          Relevant Orders   Microalbumin / creatinine urine ratio (Completed)   Hemoglobin A1c   Comprehensive metabolic panel   Lipid Panel w/reflex Direct LDL    I spent a total of  30, minutes with this patient in a face to face visit on the date of this encounter reviewing the last office visit with me ,  patient's diet and eating habits, counselling on diagnosis of diabetes ,   most recent imaging study ,   and post visit ordering of testing and therapeutics.    Follow-up: Return in about 6 months (around 02/17/2023).   Crecencio Mc, MD

## 2022-08-19 NOTE — Patient Instructions (Addendum)
Please consider getting the influenza,   Prevnar 20 (pneumonia vaccine) and RSV vaccine this fall.   You need an annual diabetic eye exam [facility] Plan)

## 2022-08-20 NOTE — Assessment & Plan Note (Signed)
counselling given   Standards of care outlined,  dietary modifications recommended . Continue statin   Lab Results  Component Value Date   HGBA1C 6.6 (H) 08/03/2022   Lab Results  Component Value Date   MICROALBUR <0.7 08/19/2022   MICROALBUR <0.7 01/07/2022   Lab Results  Component Value Date   CHOL 246 (H) 08/03/2022   HDL 78.60 08/03/2022   LDLCALC 154 (H) 08/03/2022   LDLDIRECT 141.0 08/03/2022   TRIG 64.0 08/03/2022   CHOLHDL 3 08/03/2022

## 2022-11-13 ENCOUNTER — Telehealth: Payer: Self-pay | Admitting: Internal Medicine

## 2022-11-13 ENCOUNTER — Encounter: Payer: Self-pay | Admitting: Internal Medicine

## 2022-11-13 NOTE — Telephone Encounter (Signed)
Fax received on this issue, 11/13/2022. Paper is up front in Dr Lupita Dawn color folder.

## 2022-11-13 NOTE — Telephone Encounter (Signed)
The lab in question is 08/03/22, [facility]

## 2022-11-13 NOTE — Telephone Encounter (Signed)
Form faxed to [phone]

## 2022-11-13 NOTE — Telephone Encounter (Signed)
Santiago Glad from [place] called, [phone]. Ref# [id number]. Wanted to know if provider wanted to add additional diagnostic codes.

## 2022-11-19 DIAGNOSIS — K5909 Other constipation: Secondary | ICD-10-CM | POA: Diagnosis not present

## 2022-11-19 DIAGNOSIS — Z1211 Encounter for screening for malignant neoplasm of colon: Secondary | ICD-10-CM | POA: Diagnosis not present

## 2022-11-22 DIAGNOSIS — L309 Dermatitis, unspecified: Secondary | ICD-10-CM | POA: Diagnosis not present

## 2022-11-22 DIAGNOSIS — B029 Zoster without complications: Secondary | ICD-10-CM | POA: Diagnosis not present

## 2022-11-26 ENCOUNTER — Other Ambulatory Visit: Payer: Self-pay

## 2022-11-26 MED ORDER — PANTOPRAZOLE SODIUM 40 MG PO TBEC: 40.0000 mg | DELAYED_RELEASE_TABLET | Freq: Every day | ORAL | 3 refills | Status: DC

## 2022-12-17 ENCOUNTER — Other Ambulatory Visit: Payer: Self-pay | Admitting: Internal Medicine

## 2022-12-22 ENCOUNTER — Encounter: Payer: Self-pay | Admitting: Internal Medicine

## 2022-12-22 ENCOUNTER — Ambulatory Visit: Payer: BC Managed Care – PPO | Admitting: Internal Medicine

## 2022-12-22 VITALS — BP 108/66 | HR 68 | Temp 98.3°F | Ht 62.0 in | Wt 107.0 lb

## 2022-12-22 DIAGNOSIS — J189 Pneumonia, unspecified organism: Secondary | ICD-10-CM

## 2022-12-22 DIAGNOSIS — R051 Acute cough: Secondary | ICD-10-CM | POA: Diagnosis not present

## 2022-12-22 LAB — POCT INFLUENZA A/B
Influenza A, POC: NEGATIVE
Influenza B, POC: NEGATIVE

## 2022-12-22 LAB — POC COVID19 BINAXNOW: SARS Coronavirus 2 Ag: NEGATIVE

## 2022-12-22 MED ORDER — BENZONATATE 200 MG PO CAPS: 200.0000 mg | ORAL_CAPSULE | Freq: Three times a day (TID) | ORAL | 1 refills | Status: DC | PRN

## 2022-12-22 MED ORDER — PREDNISONE 10 MG PO TABS: ORAL_TABLET | ORAL | 0 refills | Status: DC

## 2022-12-22 MED ORDER — DOXYCYCLINE HYCLATE 100 MG PO TABS: 100.0000 mg | ORAL_TABLET | Freq: Two times a day (BID) | ORAL | 0 refills | Status: DC

## 2022-12-22 MED ORDER — HYDROCOD POLI-CHLORPHE POLI ER 10-8 MG/5ML PO SUER: 5.0000 mL | Freq: Two times a day (BID) | ORAL | 0 refills | Status: DC | PRN

## 2022-12-22 NOTE — Patient Instructions (Addendum)
I am treating you for a COPD exacerbation with doxycycline ,  prednisone taper and two cough medications:    The tussionex syrup is VERY STRONG AND WILL MAKE YOU DROWSY.  DO NOT COMBINE WITH ALCOHOL OR ALPRAZOLAM  The cough tablets can be used during the day when you return to work on Friday   Please return tomorrow for a chest x ray . If it shows a pneumonia I will add a second antibiotic   Please take a probiotic ( Align, Floraque or Culturelle), the generic version of one of these over the counter medications, or a PROBIOTIC BEVERAGE  (kombucha,  Lamar ) Yogurt, or another dietary source) for a minimum of 3 weeks to prevent a serious antibiotic associated diarrhea  Called clostridium dificile colitis.  Taking a probiotic may also prevent vaginitis due to yeast infections and can be continued indefinitely if you feel that it improves your digestion or your elimination (bowels).

## 2022-12-22 NOTE — Progress Notes (Unsigned)
Subjective:  Patient ID: Sherry Petersen, female    DOB: 1961-01-29  Age: 62 y.o. MRN: [id number]  CC: The primary encounter diagnosis was Acute cough. A diagnosis of Community acquired pneumonia of left lower lobe of lung was also pertinent to this visit.   HPI Sherry Petersen presents for evaluation of cough.  Chief Complaint  Patient presents with   Cough    Coughing up bloody phlegm x 2 weeks   62 yr old female with emphysema by 2016 chest x ray,  history of tobacco  abuse , no prior PFTs,  type 2 DM. Presents with cough and rhinorrhea , both productive of blood streaked sputum for the past 2 weeks . Works at [employer].  Symptoms Started with sore throat, chest tightness and cough productive of  brown sputum  acc'd by subjective fever and body aches,  headache and right TMJ pain that would radiate to scalp.  Symptoms have not improved in 2 weeks, she is fatigued,  altered sense of taste for the past yeear (since COVID infection ) .   Has been taking mucinex all in one.    Has had a pruritic rash on her back , both sides  for the past month started to improve  but then returned and now is spreading to legs  of last week.  No recent medication additions..  was taking protonix until dec 7.  Rash started after stopping Protonix .  Using vix vapo rub  No recent stay in hotels.   Missed several days of work starting Dec 31   all of [place], [place]  .  Missed yesterday and today.       Patient was noted to have blood in sinuses during today's nasal swab for POC COVID and rapid influenza testing. Both were negative    Outpatient Medications Prior to Visit  Medication Sig Dispense Refill   ALPRAZolam (XANAX) 0.5 MG tablet TAKE 1 TABLET BY MOUTH AT BEDTIME AS NEEDED FOR ANXIETY 30 tablet 5   amLODipine (NORVASC) 2.5 MG tablet Take 1 tablet by mouth once daily 90 tablet 1   atorvastatin (LIPITOR) 40 MG tablet Take 1 tablet (40 mg total) by mouth daily. 90 tablet 1   pantoprazole (PROTONIX) 40 MG  tablet Take 1 tablet (40 mg total) by mouth daily. 30 tablet 3   sertraline (ZOLOFT) 100 MG tablet Take 1 tablet (100 mg total) by mouth daily. 90 tablet 1   No facility-administered medications prior to visit.    Review of Systems;  Patient denies headache, fevers, malaise, unintentional weight loss, skin rash, eye pain, sinus congestion and sinus pain, sore throat, dysphagia,  hemoptysis , cough, dyspnea, wheezing, chest pain, palpitations, orthopnea, edema, abdominal pain, nausea, melena, diarrhea, constipation, flank pain, dysuria, hematuria, urinary  Frequency, nocturia, numbness, tingling, seizures,  Focal weakness, Loss of consciousness,  Tremor, insomnia, depression, anxiety, and suicidal ideation.      Objective:  BP 108/66   Pulse 68   Temp 98.3 F (36.8 C) (Oral)   Ht '5\' 2"'$  (1.575 m)   Wt 107 lb (48.5 kg)   SpO2 96%   BMI 19.57 kg/m   BP Readings from Last 3 Encounters:  12/22/22 108/66  08/19/22 (!) 162/88  08/03/22 (!) 142/80    Wt Readings from Last 3 Encounters:  12/22/22 107 lb (48.5 kg)  08/19/22 111 lb 12.8 oz (50.7 kg)  08/03/22 113 lb (51.3 kg)    Physical Exam Vitals reviewed.  Constitutional:  General: She is not in acute distress.    Appearance: Normal appearance. She is normal weight. She is not ill-appearing, toxic-appearing or diaphoretic.  HENT:     Head: Normocephalic.     Right Ear: Tympanic membrane normal.     Left Ear: Tympanic membrane normal.     Nose: Congestion and rhinorrhea present.  Eyes:     General: No scleral icterus.       Right eye: No discharge.        Left eye: No discharge.     Conjunctiva/sclera: Conjunctivae normal.  Pulmonary:     Effort: Pulmonary effort is normal.     Breath sounds: No stridor. No wheezing, rhonchi or rales.     Comments: Coughing repeatedly  moist cough Chest:     Chest wall: No tenderness.  Abdominal:     General: Abdomen is flat.     Palpations: Abdomen is soft.  Musculoskeletal:         General: Normal range of motion.  Skin:    General: Skin is warm and dry.     Comments: Diffuse excoriated papular rash covering mid thoracic and lumbar portions of her back   Neurological:     General: No focal deficit present.     Mental Status: She is alert and oriented to person, place, and time. Mental status is at baseline.  Psychiatric:        Mood and Affect: Mood normal.        Behavior: Behavior normal.        Thought Content: Thought content normal.        Judgment: Judgment normal.     Lab Results  Component Value Date   HGBA1C 6.6 (H) 08/03/2022   HGBA1C 6.4 03/14/2012    Lab Results  Component Value Date   CREATININE 0.87 08/03/2022   CREATININE 0.79 01/07/2022   CREATININE 0.71 05/01/2021    Lab Results  Component Value Date   WBC 5.3 01/07/2022   HGB 13.6 01/07/2022   HCT 41.8 01/07/2022   PLT 237.0 01/07/2022   GLUCOSE 88 08/03/2022   CHOL 246 (H) 08/03/2022   TRIG 64.0 08/03/2022   HDL 78.60 08/03/2022   LDLDIRECT 141.0 08/03/2022   LDLCALC 154 (H) 08/03/2022   ALT 12 08/03/2022   AST 22 08/03/2022   NA 144 08/03/2022   K 4.2 08/03/2022   CL 107 08/03/2022   CREATININE 0.87 08/03/2022   BUN 16 08/03/2022   CO2 26 08/03/2022   TSH 1.03 08/03/2022   HGBA1C 6.6 (H) 08/03/2022   MICROALBUR <0.7 08/19/2022    CT RENAL STONE STUDY  Result Date: 03/30/2018 CLINICAL DATA:  Gross hematuria. EXAM: CT ABDOMEN AND PELVIS WITHOUT CONTRAST TECHNIQUE: Multidetector CT imaging of the abdomen and pelvis was performed following the standard protocol without IV contrast. COMPARISON:  CT scan of September 02, 2015. FINDINGS: Lower chest: Mild left basilar subsegmental atelectasis is noted. Hepatobiliary: No gallstones are noted. Stable small left hepatic cyst. No other abnormality seen in the liver. No biliary dilatation is noted. Pancreas: Unremarkable. No pancreatic ductal dilatation or surrounding inflammatory changes. Spleen: Normal in size without focal  abnormality. Adrenals/Urinary Tract: Adrenal glands are unremarkable. Kidneys are normal, without renal calculi, focal lesion, or hydronephrosis. Bladder is unremarkable. Stomach/Bowel: The stomach and appendix appear normal. No bowel dilatation is noted. Wall thickening of the cecum is noted with mild surrounding inflammation suggesting mild colitis. Vascular/Lymphatic: Aortic atherosclerosis. No enlarged abdominal or pelvic lymph nodes. Reproductive: Status post  hysterectomy. No adnexal masses. Other: No abdominal wall hernia or abnormality. No abdominopelvic ascites. Musculoskeletal: No acute or significant osseous findings. IMPRESSION: Mild wall thickening of the cecum with mild surrounding inflammation is noted suggesting mild infectious or inflammatory colitis. No hydronephrosis or renal obstruction is noted. No renal or ureteral calculi are noted. Aortic Atherosclerosis (ICD10-I70.0). Electronically Signed   By: Marijo Conception, M.D.   On: 03/30/2018 14:55    Assessment & Plan:  .Acute cough -     POC COVID-19 BinaxNow -     POCT Influenza A/B -     Respiratory virus panel -     DG Chest 2 View; Future  Community acquired pneumonia of left lower lobe of lung Assessment & Plan: Suggested by chest xray (prelimiary read by me).Marland Kitchen adding macrolide to doxcycline prescribed for COPD exacerbation    Other orders -     Doxycycline Hyclate; Take 1 tablet (100 mg total) by mouth 2 (two) times daily.  Dispense: 14 tablet; Refill: 0 -     predniSONE; 6 tablets on Day 1 , then reduce by 1 tablet daily until gone  Dispense: 21 tablet; Refill: 0 -     Hydrocod Poli-Chlorphe Poli ER; Take 5 mLs by mouth every 12 (twelve) hours as needed for cough.  Dispense: 140 mL; Refill: 0 -     Benzonatate; Take 1 capsule (200 mg total) by mouth 3 (three) times daily as needed for cough.  Dispense: 60 capsule; Refill: 1 -     Azithromycin; Take 1 tablet (500 mg total) by mouth daily.  Dispense: 7 tablet; Refill:  0     I provided 30 minutes of face-to-face time during this encounter reviewing patient's last visit with me, patient's  most recent visit with cardiology,  nephrology,  and neurology,  recent surgical and non surgical procedures, previous  labs and imaging studies, counseling on currently addressed issues,  and post visit ordering to diagnostics and therapeutics .   Follow-up: No follow-ups on file.   Crecencio Mc, MD

## 2022-12-23 ENCOUNTER — Ambulatory Visit: Payer: BC Managed Care – PPO | Admitting: Internal Medicine

## 2022-12-23 ENCOUNTER — Other Ambulatory Visit: Payer: BC Managed Care – PPO

## 2022-12-23 ENCOUNTER — Ambulatory Visit (INDEPENDENT_AMBULATORY_CARE_PROVIDER_SITE_OTHER): Payer: BC Managed Care – PPO

## 2022-12-23 DIAGNOSIS — R051 Acute cough: Secondary | ICD-10-CM | POA: Diagnosis not present

## 2022-12-23 DIAGNOSIS — J189 Pneumonia, unspecified organism: Secondary | ICD-10-CM

## 2022-12-23 DIAGNOSIS — J439 Emphysema, unspecified: Secondary | ICD-10-CM | POA: Diagnosis not present

## 2022-12-23 HISTORY — DX: Pneumonia, unspecified organism: J18.9

## 2022-12-23 MED ORDER — AZITHROMYCIN 500 MG PO TABS: 500.0000 mg | ORAL_TABLET | Freq: Every day | ORAL | 0 refills | Status: DC

## 2022-12-23 NOTE — Assessment & Plan Note (Addendum)
Suggested by chest xray (prelimiary read by me).Marland Kitchen adding macrolide to doxcycline prescribed for COPD exacerbation

## 2022-12-25 LAB — RESPIRATORY VIRUS PANEL

## 2022-12-29 ENCOUNTER — Telehealth: Payer: Self-pay

## 2022-12-29 NOTE — Telephone Encounter (Signed)
-----  Message from Crecencio Mc, MD sent at 12/24/2022  3:52 PM EST ----- Chest x ray reading by radiologist confirms that you have pneumonia so you need to take both antibiotics .  Please follow up in 4 weeks for a repeat film   Regards,   Deborra Medina, MD

## 2022-12-29 NOTE — Telephone Encounter (Signed)
LMTCB. Need to schedule pt for a 4 week follow up.

## 2023-01-23 DIAGNOSIS — K6389 Other specified diseases of intestine: Secondary | ICD-10-CM | POA: Diagnosis not present

## 2023-01-23 DIAGNOSIS — E119 Type 2 diabetes mellitus without complications: Secondary | ICD-10-CM | POA: Diagnosis not present

## 2023-01-23 DIAGNOSIS — K529 Noninfective gastroenteritis and colitis, unspecified: Secondary | ICD-10-CM | POA: Diagnosis not present

## 2023-01-23 DIAGNOSIS — R1030 Lower abdominal pain, unspecified: Secondary | ICD-10-CM | POA: Diagnosis not present

## 2023-01-26 ENCOUNTER — Telehealth: Payer: Self-pay

## 2023-01-26 NOTE — Transitions of Care (Post Inpatient/ED Visit) (Signed)
   01/26/2023  Name: Sherry Petersen MRN: [id number] DOB: May 17, 1961  Today's TOC FU Call Status: Today's TOC FU Call Status:: Unsuccessul Call (1st Attempt) Unsuccessful Call (1st Attempt) Date: 01/26/23  Attempted to reach the patient regarding the most recent Inpatient/ED visit.  Follow Up Plan: Additional outreach attempts will be made to reach the patient to complete the Transitions of Care (Post Inpatient/ED visit) call.   Signature Ferne Reus, RN

## 2023-01-27 NOTE — Telephone Encounter (Signed)
Patient returned office phone cal, she has an appointment with Dr Derrel Nip tomorrow, 01/28/2023

## 2023-01-27 NOTE — Transitions of Care (Post Inpatient/ED Visit) (Signed)
   01/27/2023  Name: NKECHI LINEHAN MRN: [id number] DOB: [birth year]  Today's TOC FU Call Status: Today's TOC FU Call Status:: Successful TOC FU Call Competed Unsuccessful Call (1st Attempt) Date: 01/26/23 [facility] FU Call Complete Date: 01/27/23  Transition Care Management Follow-up Telephone Call Date of Discharge: 01/23/23 Discharge Facility: Other Facility [facility]) [facility] of Other Discharge Facility: [facility] Type of Discharge: Emergency Department Reason for ED Visit: Other: (Ulcertative Colitis) How have you been since you were released from the hospital?: Same Any questions or concerns?: No  Items Reviewed: Did you receive and understand the discharge instructions provided?: Yes Medications obtained and verified?: Yes (Medications Reviewed) (New orders for Metronidazole '500mg'$  and Cipro '500mg'$ ) Any new allergies since your discharge?: No Dietary orders reviewed?: NA Do you have support at home?: Yes People in Home: spouse  Home Care and Equipment/Supplies: [place] Ordered?: No Any new equipment or medical supplies ordered?: NA  Functional Questionnaire: Do you need assistance with bathing/showering or dressing?: Yes Do you need assistance with meal preparation?: Yes Do you need assistance with eating?: Yes Do you have difficulty maintaining continence: Yes Do you need assistance with getting out of bed/getting out of a chair/moving?: Yes Do you have difficulty managing or taking your medications?: Yes  Folllow up appointments reviewed: PCP Follow-up appointment confirmed?: Yes Date of PCP follow-up appointment?: 01/28/23 Follow-up Provider: Dr. Derrel Nip Specialist [facility] Follow-up appointment confirmed?: NA (Pt scheduled for colonoscopy 03/05/23) Do you need transportation to your follow-up appointment?: No Do you understand care options if your condition(s) worsen?: Yes-patient verbalized understanding    SIGNATURE Ferne Reus,  RN

## 2023-01-28 ENCOUNTER — Ambulatory Visit: Payer: BC Managed Care – PPO | Admitting: Internal Medicine

## 2023-01-28 ENCOUNTER — Encounter: Payer: Self-pay | Admitting: Internal Medicine

## 2023-01-28 VITALS — BP 140/86 | HR 45 | Temp 97.9°F | Ht 62.0 in | Wt 103.2 lb

## 2023-01-28 DIAGNOSIS — K5909 Other constipation: Secondary | ICD-10-CM | POA: Diagnosis not present

## 2023-01-28 DIAGNOSIS — J189 Pneumonia, unspecified organism: Secondary | ICD-10-CM | POA: Diagnosis not present

## 2023-01-28 DIAGNOSIS — K529 Noninfective gastroenteritis and colitis, unspecified: Secondary | ICD-10-CM | POA: Diagnosis not present

## 2023-01-28 DIAGNOSIS — R197 Diarrhea, unspecified: Secondary | ICD-10-CM

## 2023-01-28 DIAGNOSIS — I10 Essential (primary) hypertension: Secondary | ICD-10-CM

## 2023-01-28 DIAGNOSIS — I700 Atherosclerosis of aorta: Secondary | ICD-10-CM

## 2023-01-28 LAB — CBC WITH DIFFERENTIAL/PLATELET
Basophils Absolute: 0 10*3/uL (ref 0.0–0.1)
Basophils Relative: 1.2 % (ref 0.0–3.0)
Eosinophils Absolute: 0.2 10*3/uL (ref 0.0–0.7)
Eosinophils Relative: 5.9 % — ABNORMAL HIGH (ref 0.0–5.0)
HCT: 39.4 % (ref 36.0–46.0)
Hemoglobin: 13.1 g/dL (ref 12.0–15.0)
Lymphocytes Relative: 36.2 % (ref 12.0–46.0)
Lymphs Abs: 1.4 10*3/uL (ref 0.7–4.0)
MCHC: 33.2 g/dL (ref 30.0–36.0)
MCV: 94 fl (ref 78.0–100.0)
Monocytes Absolute: 0.3 10*3/uL (ref 0.1–1.0)
Monocytes Relative: 7.8 % (ref 3.0–12.0)
Neutro Abs: 1.8 10*3/uL (ref 1.4–7.7)
Neutrophils Relative %: 48.9 % (ref 43.0–77.0)
Platelets: 240 10*3/uL (ref 150.0–400.0)
RBC: 4.19 Mil/uL (ref 3.87–5.11)
RDW: 14.5 % (ref 11.5–15.5)
WBC: 3.8 10*3/uL — ABNORMAL LOW (ref 4.0–10.5)

## 2023-01-28 LAB — COMPREHENSIVE METABOLIC PANEL
ALT: 8 U/L (ref 0–35)
AST: 19 U/L (ref 0–37)
Albumin: 3.9 g/dL (ref 3.5–5.2)
Alkaline Phosphatase: 67 U/L (ref 39–117)
BUN: 20 mg/dL (ref 6–23)
CO2: 25 mEq/L (ref 19–32)
Calcium: 9.1 mg/dL (ref 8.4–10.5)
Chloride: 108 mEq/L (ref 96–112)
Creatinine, Ser: 0.75 mg/dL (ref 0.40–1.20)
GFR: 84.33 mL/min (ref >60.00)
Glucose, Bld: 89 mg/dL (ref 70–99)
Potassium: 4 mEq/L (ref 3.5–5.1)
Sodium: 141 mEq/L (ref 135–145)
Total Bilirubin: 0.4 mg/dL (ref 0.2–1.2)
Total Protein: 5.9 g/dL — ABNORMAL LOW (ref 6.0–8.3)

## 2023-01-28 LAB — C-REACTIVE PROTEIN: CRP: <1 mg/dL (ref 0.5–20.0)

## 2023-01-28 LAB — SEDIMENTATION RATE: Sed Rate: 4 mm/hr (ref 0–30)

## 2023-01-28 LAB — MAGNESIUM: Magnesium: 1.8 mg/dL (ref 1.5–2.5)

## 2023-01-28 MED ORDER — LACTULOSE 20 GM/30ML PO SOLN: ORAL | 3 refills | Status: DC

## 2023-01-28 NOTE — Patient Instructions (Addendum)
Starting lactulose  for the constipation . If no results in 24 hours,  start the colonoscopy prep and let me now so I can refill   Please take a probiotic ( Align, Floraque or Culturelle) or eat yogurt daily for three weeks  For a minimum of 3 weeks to prevent a serious antibiotic associated diarrhea  Called clostridium dificile colitis

## 2023-01-28 NOTE — Progress Notes (Signed)
Subjective:  Patient ID: Sherry Petersen, female    DOB: [birth year]  Age: 62 y.o. MRN: [id number]  CC: The primary encounter diagnosis was Diarrhea, unspecified type. Diagnoses of Colitis, Other constipation, Atherosclerosis of aorta ([place]), Community acquired pneumonia of left lower lobe of lung, and Essential hypertension were also pertinent to this visit.   HPI Sherry Petersen presents for follow up on recent treatment for pneumonia and recent ER follow up for colitis Chief Complaint  Patient presents with   Medical Management of Chronic Issues    4 week follow up from pneumonia   Treated for community acquired pneumonia   4 weeks ago .  Symptoms have resolved.    However she was treated ;last Saturday at [facility] for colitis.   CT scan was done.  Recieved iv fluids,  discharged home on  antibiotics metronidazole and cipro.  Still taking them.  Last BM was Sunday denies abdominal pain.    Not taking any laxatives per ER physician bc she had been taking mag citrate, dulcolax , and miralax    Outpatient Medications Prior to Visit  Medication Sig Dispense Refill   ALPRAZolam (XANAX) 0.5 MG tablet TAKE 1 TABLET BY MOUTH AT BEDTIME AS NEEDED FOR ANXIETY 30 tablet 5   amLODipine (NORVASC) 2.5 MG tablet Take 1 tablet by mouth once daily 90 tablet 1   atorvastatin (LIPITOR) 40 MG tablet Take 1 tablet (40 mg total) by mouth daily. 90 tablet 1   ciprofloxacin (CIPRO) 500 MG tablet Take 500 mg by mouth 2 (two) times daily.     metroNIDAZOLE (FLAGYL) 500 MG tablet Take 500 mg by mouth 3 (three) times daily.     pantoprazole (PROTONIX) 40 MG tablet Take 1 tablet (40 mg total) by mouth daily. 30 tablet 3   sertraline (ZOLOFT) 100 MG tablet Take 1 tablet (100 mg total) by mouth daily. 90 tablet 1   traMADol (ULTRAM) 50 MG tablet Take by mouth.     azithromycin (ZITHROMAX) 500 MG tablet Take 1 tablet (500 mg total) by mouth daily. (Patient not taking: Reported on 01/28/2023) 7 tablet 0    benzonatate (TESSALON) 200 MG capsule Take 1 capsule (200 mg total) by mouth 3 (three) times daily as needed for cough. (Patient not taking: Reported on 01/28/2023) 60 capsule 1   chlorpheniramine-HYDROcodone (TUSSIONEX) 10-8 MG/5ML Take 5 mLs by mouth every 12 (twelve) hours as needed for cough. (Patient not taking: Reported on 01/28/2023) 140 mL 0   doxycycline (VIBRA-TABS) 100 MG tablet Take 1 tablet (100 mg total) by mouth 2 (two) times daily. (Patient not taking: Reported on 01/28/2023) 14 tablet 0   predniSONE (DELTASONE) 10 MG tablet 6 tablets on Day 1 , then reduce by 1 tablet daily until gone (Patient not taking: Reported on 01/28/2023) 21 tablet 0   No facility-administered medications prior to visit.    Review of Systems;  Patient denies headache, fevers, malaise, unintentional weight loss, skin rash, eye pain, sinus congestion and sinus pain, sore throat, dysphagia,  hemoptysis , cough, dyspnea, wheezing, chest pain, palpitations, orthopnea, edema, abdominal pain, nausea, melena, diarrhea, constipation, flank pain, dysuria, hematuria, urinary  Frequency, nocturia, numbness, tingling, seizures,  Focal weakness, Loss of consciousness,  Tremor, insomnia, depression, anxiety, and suicidal ideation.      Objective:  BP (!) 140/86   Pulse (!) 45   Temp 97.9 F (36.6 C) (Oral)   Ht 5' 2"$  (1.575 m)   Wt 103 lb 3.2 oz (46.8 kg)  SpO2 98%   BMI 18.88 kg/m   BP Readings from Last 3 Encounters:  01/28/23 (!) 140/86  12/22/22 108/66  08/19/22 (!) 162/88    Wt Readings from Last 3 Encounters:  01/28/23 103 lb 3.2 oz (46.8 kg)  12/22/22 107 lb (48.5 kg)  08/19/22 111 lb 12.8 oz (50.7 kg)    Physical Exam  Lab Results  Component Value Date   HGBA1C 6.6 (H) 08/03/2022   HGBA1C 6.4 03/14/2012    Lab Results  Component Value Date   CREATININE 0.75 01/28/2023   CREATININE 0.87 08/03/2022   CREATININE 0.79 01/07/2022    Lab Results  Component Value Date   WBC 3.8 (L)  01/28/2023   HGB 13.1 01/28/2023   HCT 39.4 01/28/2023   PLT 240.0 01/28/2023   GLUCOSE 89 01/28/2023   CHOL 246 (H) 08/03/2022   TRIG 64.0 08/03/2022   HDL 78.60 08/03/2022   LDLDIRECT 141.0 08/03/2022   LDLCALC 154 (H) 08/03/2022   ALT 8 01/28/2023   AST 19 01/28/2023   NA 141 01/28/2023   K 4.0 01/28/2023   CL 108 01/28/2023   CREATININE 0.75 01/28/2023   BUN 20 01/28/2023   CO2 25 01/28/2023   TSH 1.03 08/03/2022   HGBA1C 6.6 (H) 08/03/2022   MICROALBUR <0.7 08/19/2022    CT RENAL STONE STUDY  Result Date: 03/30/2018 CLINICAL DATA:  Gross hematuria. EXAM: CT ABDOMEN AND PELVIS WITHOUT CONTRAST TECHNIQUE: Multidetector CT imaging of the abdomen and pelvis was performed following the standard protocol without IV contrast. COMPARISON:  CT scan of September 02, 2015. FINDINGS: Lower chest: Mild left basilar subsegmental atelectasis is noted. Hepatobiliary: No gallstones are noted. Stable small left hepatic cyst. No other abnormality seen in the liver. No biliary dilatation is noted. Pancreas: Unremarkable. No pancreatic ductal dilatation or surrounding inflammatory changes. Spleen: Normal in size without focal abnormality. Adrenals/Urinary Tract: Adrenal glands are unremarkable. Kidneys are normal, without renal calculi, focal lesion, or hydronephrosis. Bladder is unremarkable. Stomach/Bowel: The stomach and appendix appear normal. No bowel dilatation is noted. Wall thickening of the cecum is noted with mild surrounding inflammation suggesting mild colitis. Vascular/Lymphatic: Aortic atherosclerosis. No enlarged abdominal or pelvic lymph nodes. Reproductive: Status post hysterectomy. No adnexal masses. Other: No abdominal wall hernia or abnormality. No abdominopelvic ascites. Musculoskeletal: No acute or significant osseous findings. IMPRESSION: Mild wall thickening of the cecum with mild surrounding inflammation is noted suggesting mild infectious or inflammatory colitis. No hydronephrosis  or renal obstruction is noted. No renal or ureteral calculi are noted. Aortic Atherosclerosis (ICD10-I70.0). Electronically Signed   By: Marijo Conception, M.D.   On: 03/30/2018 14:55    Assessment & Plan:  .Diarrhea, unspecified type  Colitis -     Sedimentation rate -     C-reactive protein -     CBC with Differential/Platelet  Other constipation -     Comprehensive metabolic panel -     Magnesium  Atherosclerosis of aorta [facility]) Assessment & Plan: Noted on CT scan in 2016,  With ongoing tobacco abuse until several months ago. .  She has resumed atorvastatin  Lab Results  Component Value Date   CHOL 246 (H) 08/03/2022   HDL 78.60 08/03/2022   LDLCALC 154 (H) 08/03/2022   LDLDIRECT 141.0 08/03/2022   TRIG 64.0 08/03/2022   CHOLHDL 3 08/03/2022      Community acquired pneumonia of left lower lobe of lung Assessment & Plan: Suggested and treated after CT Scan done at outside hospital.  In  the setting of recent antibiotic use  need to consider the possibility of  c diicile colitis, for colonoscopy last month    Essential hypertension Assessment & Plan: Does not check at home.  2nd reading today is at goal on current amlodipine dose   Other orders -     Lactulose; 30 ml every 4 hours until constipation is relieved  Dispense: 236 mL; Refill: 3     I provided 20 minutes of face-to-face time during this encounter reviewing patient's last visit with me, patient's  most recent visit with cardiology,  nephrology,  and neurology,  recent surgical and non surgical procedures, previous  labs and imaging studies, counseling on currently addressed issues,  and post visit ordering to diagnostics and therapeutics .   Follow-up: No follow-ups on file.   Crecencio Mc, MD

## 2023-01-31 NOTE — Assessment & Plan Note (Signed)
Suggested and treated after CT Scan done at outside hospital.  In the setting of recent antibiotic use  need to consider the possibility of  c diicile colitis, for colonoscopy last month

## 2023-01-31 NOTE — Assessment & Plan Note (Signed)
Noted on CT scan in 2016,  With ongoing tobacco abuse until several months ago. .  She has resumed atorvastatin  Lab Results  Component Value Date   CHOL 246 (H) 08/03/2022   HDL 78.60 08/03/2022   LDLCALC 154 (H) 08/03/2022   LDLDIRECT 141.0 08/03/2022   TRIG 64.0 08/03/2022   CHOLHDL 3 08/03/2022

## 2023-01-31 NOTE — Progress Notes (Incomplete)
Subjective:  Patient ID: Sherry Petersen, female    DOB: 06/08/1961  Age: 62 y.o. MRN: [id number]  CC: There were no encounter diagnoses.   HPI CHERICA VERBURG presents for follow up on recent treatment for pneumonia and recent ER follow up for colitis Chief Complaint  Patient presents with  . Medical Management of Chronic Issues    4 week follow up from pneumonia   Treated for community acquired pneumonia   4 weeks ago .  Symptoms have resolved.    However she was treated ;last Saturday at [facility] for colitis.   CT scan was done.  Recieved iv fluids,  discharged home on  antibiotics metronidazole and cipro.  Still taking them.  Last BM was Sunday denies abdominal pain.    Not taking any laxatives per ER physician bc she had been taking mag citrate, dulcolax , and miralax    Outpatient Medications Prior to Visit  Medication Sig Dispense Refill  . ALPRAZolam (XANAX) 0.5 MG tablet TAKE 1 TABLET BY MOUTH AT BEDTIME AS NEEDED FOR ANXIETY 30 tablet 5  . amLODipine (NORVASC) 2.5 MG tablet Take 1 tablet by mouth once daily 90 tablet 1  . atorvastatin (LIPITOR) 40 MG tablet Take 1 tablet (40 mg total) by mouth daily. 90 tablet 1  . ciprofloxacin (CIPRO) 500 MG tablet Take 500 mg by mouth 2 (two) times daily.    . metroNIDAZOLE (FLAGYL) 500 MG tablet Take 500 mg by mouth 3 (three) times daily.    . pantoprazole (PROTONIX) 40 MG tablet Take 1 tablet (40 mg total) by mouth daily. 30 tablet 3  . sertraline (ZOLOFT) 100 MG tablet Take 1 tablet (100 mg total) by mouth daily. 90 tablet 1  . traMADol (ULTRAM) 50 MG tablet Take by mouth.    Marland Kitchen azithromycin (ZITHROMAX) 500 MG tablet Take 1 tablet (500 mg total) by mouth daily. (Patient not taking: Reported on 01/28/2023) 7 tablet 0  . benzonatate (TESSALON) 200 MG capsule Take 1 capsule (200 mg total) by mouth 3 (three) times daily as needed for cough. (Patient not taking: Reported on 01/28/2023) 60 capsule 1  . chlorpheniramine-HYDROcodone  (TUSSIONEX) 10-8 MG/5ML Take 5 mLs by mouth every 12 (twelve) hours as needed for cough. (Patient not taking: Reported on 01/28/2023) 140 mL 0  . doxycycline (VIBRA-TABS) 100 MG tablet Take 1 tablet (100 mg total) by mouth 2 (two) times daily. (Patient not taking: Reported on 01/28/2023) 14 tablet 0  . predniSONE (DELTASONE) 10 MG tablet 6 tablets on Day 1 , then reduce by 1 tablet daily until gone (Patient not taking: Reported on 01/28/2023) 21 tablet 0   No facility-administered medications prior to visit.    Review of Systems;  Patient denies headache, fevers, malaise, unintentional weight loss, skin rash, eye pain, sinus congestion and sinus pain, sore throat, dysphagia,  hemoptysis , cough, dyspnea, wheezing, chest pain, palpitations, orthopnea, edema, abdominal pain, nausea, melena, diarrhea, constipation, flank pain, dysuria, hematuria, urinary  Frequency, nocturia, numbness, tingling, seizures,  Focal weakness, Loss of consciousness,  Tremor, insomnia, depression, anxiety, and suicidal ideation.      Objective:  BP (!) 140/86   Pulse (!) 45   Temp 97.9 F (36.6 C) (Oral)   Ht 5' 2"$  (1.575 m)   Wt 103 lb 3.2 oz (46.8 kg)   SpO2 98%   BMI 18.88 kg/m   BP Readings from Last 3 Encounters:  01/28/23 (!) 140/86  12/22/22 108/66  08/19/22 (!) 162/88  Wt Readings from Last 3 Encounters:  01/28/23 103 lb 3.2 oz (46.8 kg)  12/22/22 107 lb (48.5 kg)  08/19/22 111 lb 12.8 oz (50.7 kg)    Physical Exam  Lab Results  Component Value Date   HGBA1C 6.6 (H) 08/03/2022   HGBA1C 6.4 03/14/2012    Lab Results  Component Value Date   CREATININE 0.87 08/03/2022   CREATININE 0.79 01/07/2022   CREATININE 0.71 05/01/2021    Lab Results  Component Value Date   WBC 5.3 01/07/2022   HGB 13.6 01/07/2022   HCT 41.8 01/07/2022   PLT 237.0 01/07/2022   GLUCOSE 88 08/03/2022   CHOL 246 (H) 08/03/2022   TRIG 64.0 08/03/2022   HDL 78.60 08/03/2022   LDLDIRECT 141.0 08/03/2022    LDLCALC 154 (H) 08/03/2022   ALT 12 08/03/2022   AST 22 08/03/2022   NA 144 08/03/2022   K 4.2 08/03/2022   CL 107 08/03/2022   CREATININE 0.87 08/03/2022   BUN 16 08/03/2022   CO2 26 08/03/2022   TSH 1.03 08/03/2022   HGBA1C 6.6 (H) 08/03/2022   MICROALBUR <0.7 08/19/2022    CT RENAL STONE STUDY  Result Date: 03/30/2018 CLINICAL DATA:  Gross hematuria. EXAM: CT ABDOMEN AND PELVIS WITHOUT CONTRAST TECHNIQUE: Multidetector CT imaging of the abdomen and pelvis was performed following the standard protocol without IV contrast. COMPARISON:  CT scan of September 02, 2015. FINDINGS: Lower chest: Mild left basilar subsegmental atelectasis is noted. Hepatobiliary: No gallstones are noted. Stable small left hepatic cyst. No other abnormality seen in the liver. No biliary dilatation is noted. Pancreas: Unremarkable. No pancreatic ductal dilatation or surrounding inflammatory changes. Spleen: Normal in size without focal abnormality. Adrenals/Urinary Tract: Adrenal glands are unremarkable. Kidneys are normal, without renal calculi, focal lesion, or hydronephrosis. Bladder is unremarkable. Stomach/Bowel: The stomach and appendix appear normal. No bowel dilatation is noted. Wall thickening of the cecum is noted with mild surrounding inflammation suggesting mild colitis. Vascular/Lymphatic: Aortic atherosclerosis. No enlarged abdominal or pelvic lymph nodes. Reproductive: Status post hysterectomy. No adnexal masses. Other: No abdominal wall hernia or abnormality. No abdominopelvic ascites. Musculoskeletal: No acute or significant osseous findings. IMPRESSION: Mild wall thickening of the cecum with mild surrounding inflammation is noted suggesting mild infectious or inflammatory colitis. No hydronephrosis or renal obstruction is noted. No renal or ureteral calculi are noted. Aortic Atherosclerosis (ICD10-I70.0). Electronically Signed   By: Marijo Conception, M.D.   On: 03/30/2018 14:55    Assessment & Plan:   .There are no diagnoses linked to this encounter.   I provided 30 minutes of face-to-face time during this encounter reviewing patient's last visit with me, patient's  most recent visit with cardiology,  nephrology,  and neurology,  recent surgical and non surgical procedures, previous  labs and imaging studies, counseling on currently addressed issues,  and post visit ordering to diagnostics and therapeutics .   Follow-up: No follow-ups on file.   Crecencio Mc, MD

## 2023-01-31 NOTE — Assessment & Plan Note (Signed)
Does not check at home.  2nd reading today is at goal on current amlodipine dose

## 2023-02-19 ENCOUNTER — Other Ambulatory Visit: Payer: Self-pay | Admitting: Internal Medicine

## 2023-03-04 ENCOUNTER — Encounter: Payer: Self-pay | Admitting: Gastroenterology

## 2023-03-04 NOTE — H&P (Signed)
Pre-Procedure H&P   Patient ID: Sherry Petersen is a 62 y.o. female.  Gastroenterology Provider: Annamaria Helling, DO  Referring Provider: Laurine Blazer, PA PCP: Crecencio Mc, MD  Date: 03/05/2023  HPI Sherry Petersen is a 62 y.o. female who presents today for Colonoscopy for colorectal cancer screening .  Bout of recent constipation in February. Went to ED told she had colitis and was treated with abx. BM moving more regularly after.  Patient reports bowel movement every 2 to 3 days without melena or hematochezia.  Chronic BC powder use  Last colonoscopy May 2012 demonstrating internal hemorrhoids.  She also had an EGD at that time with gastritis but negative for H. Pylori  No family history of colon cancer or colon polyps  B12 285 folate 7.2 hemoglobin 13.1 MCV 94 platelets 240,000 creatinine 0.75.  Both ESR and CRP are negative for inflammation  H. pylori negative in August 2023   Past Medical History:  Diagnosis Date   Anxiety    Diabetes mellitus without complication ([place])    Hematuria    CT Scan normal    Past Surgical History:  Procedure Laterality Date   CARPAL TUNNEL RELEASE  2003   TONSILLECTOMY  2012   VAGINAL HYSTERECTOMY  2002    Family History No h/o GI disease or malignancy  Review of Systems  Constitutional:  Negative for activity change, appetite change, chills, diaphoresis, fatigue, fever and unexpected weight change.  HENT:  Negative for trouble swallowing and voice change.   Respiratory:  Negative for shortness of breath and wheezing.   Cardiovascular:  Negative for chest pain, palpitations and leg swelling.  Gastrointestinal:  Positive for constipation. Negative for abdominal distention, abdominal pain, anal bleeding, blood in stool, diarrhea, nausea, rectal pain and vomiting.  Musculoskeletal:  Negative for arthralgias and myalgias.  Skin:  Negative for color change and pallor.  Neurological:  Negative for dizziness, syncope  and weakness.  Psychiatric/Behavioral:  Negative for confusion.   All other systems reviewed and are negative.    Medications No current facility-administered medications on file prior to encounter.   Current Outpatient Medications on File Prior to Encounter  Medication Sig Dispense Refill   atorvastatin (LIPITOR) 40 MG tablet Take 1 tablet (40 mg total) by mouth daily. 90 tablet 1   sertraline (ZOLOFT) 100 MG tablet Take 1 tablet (100 mg total) by mouth daily. 90 tablet 1    Pertinent medications related to GI and procedure were reviewed by me with the patient prior to the procedure   Current Facility-Administered Medications:    0.9 %  sodium chloride infusion, , Intravenous, Continuous, Annamaria Helling, DO      Allergies  Allergen Reactions   Neosporin [Neomycin-Bacitracin Zn-Polymyx]    Penicillins    Sulfa Drugs Cross Reactors    Tape    Wellbutrin [Bupropion] Nausea Only   Allergies were reviewed by me prior to the procedure  Objective   Body mass index is 19.01 kg/m. Vitals:   03/05/23 0630  BP: (!) 144/91  Pulse: 65  Resp: 20  Temp: (!) 97.4 F (36.3 C)  TempSrc: Temporal  SpO2: 99%  Weight: 45.6 kg  Height: 5\' 1"  (1.549 m)     Physical Exam Vitals and nursing note reviewed.  Constitutional:      General: She is not in acute distress.    Appearance: Normal appearance. She is not ill-appearing, toxic-appearing or diaphoretic.     Comments: thin  HENT:  Head: Normocephalic and atraumatic.     Nose: Nose normal.     Mouth/Throat:     Mouth: Mucous membranes are moist.     Pharynx: Oropharynx is clear.  Eyes:     General: No scleral icterus.    Extraocular Movements: Extraocular movements intact.  Cardiovascular:     Rate and Rhythm: Normal rate and regular rhythm.     Heart sounds: Normal heart sounds. No murmur heard.    No friction rub. No gallop.  Pulmonary:     Effort: Pulmonary effort is normal. No respiratory distress.      Breath sounds: Normal breath sounds. No wheezing, rhonchi or rales.  Abdominal:     General: Abdomen is flat. Bowel sounds are normal. There is no distension.     Palpations: Abdomen is soft.     Tenderness: There is no abdominal tenderness. There is no guarding or rebound.  Musculoskeletal:     Cervical back: Neck supple.     Right lower leg: No edema.     Left lower leg: No edema.  Skin:    General: Skin is warm and dry.     Coloration: Skin is not jaundiced or pale.  Neurological:     General: No focal deficit present.     Mental Status: She is alert and oriented to person, place, and time. Mental status is at baseline.  Psychiatric:        Mood and Affect: Mood normal.        Behavior: Behavior normal.        Thought Content: Thought content normal.        Judgment: Judgment normal.      Assessment:  Sherry Petersen is a 62 y.o. female  who presents today for Colonoscopy for colorectal cancer screening .  Plan:  Colonoscopy with possible intervention today  Colonoscopy with possible biopsy, control of bleeding, polypectomy, and interventions as necessary has been discussed with the patient/patient representative. Informed consent was obtained from the patient/patient representative after explaining the indication, nature, and risks of the procedure including but not limited to death, bleeding, perforation, missed neoplasm/lesions, cardiorespiratory compromise, and reaction to medications. Opportunity for questions was given and appropriate answers were provided. Patient/patient representative has verbalized understanding is amenable to undergoing the procedure.   Annamaria Helling, DO  [facility]  Portions of the record may have been created with voice recognition software. Occasional wrong-word or 'sound-a-like' substitutions may have occurred due to the inherent limitations of voice recognition software.  Read the chart carefully and recognize,  using context, where substitutions may have occurred.

## 2023-03-05 ENCOUNTER — Ambulatory Visit: Payer: BC Managed Care – PPO | Admitting: Anesthesiology

## 2023-03-05 ENCOUNTER — Encounter
Admission: RE | Disposition: A | Discharge status: Home / Self Care | Payer: Self-pay | Source: Home / Self Care | Attending: Gastroenterology

## 2023-03-05 ENCOUNTER — Encounter: Payer: Self-pay | Admitting: Gastroenterology

## 2023-03-05 ENCOUNTER — Ambulatory Visit
Admission: RE | Admit: 2023-03-05 | Discharge: 2023-03-05 | Disposition: A | Discharge status: Home / Self Care | Payer: BC Managed Care – PPO | Attending: Gastroenterology | Admitting: Gastroenterology

## 2023-03-05 DIAGNOSIS — E119 Type 2 diabetes mellitus without complications: Secondary | ICD-10-CM | POA: Diagnosis not present

## 2023-03-05 DIAGNOSIS — F419 Anxiety disorder, unspecified: Secondary | ICD-10-CM | POA: Diagnosis not present

## 2023-03-05 DIAGNOSIS — K649 Unspecified hemorrhoids: Secondary | ICD-10-CM | POA: Diagnosis not present

## 2023-03-05 DIAGNOSIS — D122 Benign neoplasm of ascending colon: Secondary | ICD-10-CM | POA: Diagnosis not present

## 2023-03-05 DIAGNOSIS — M1990 Unspecified osteoarthritis, unspecified site: Secondary | ICD-10-CM | POA: Insufficient documentation

## 2023-03-05 DIAGNOSIS — D120 Benign neoplasm of cecum: Secondary | ICD-10-CM | POA: Diagnosis not present

## 2023-03-05 DIAGNOSIS — I10 Essential (primary) hypertension: Secondary | ICD-10-CM | POA: Insufficient documentation

## 2023-03-05 DIAGNOSIS — K648 Other hemorrhoids: Secondary | ICD-10-CM | POA: Insufficient documentation

## 2023-03-05 DIAGNOSIS — Z87891 Personal history of nicotine dependence: Secondary | ICD-10-CM | POA: Insufficient documentation

## 2023-03-05 DIAGNOSIS — Z1211 Encounter for screening for malignant neoplasm of colon: Secondary | ICD-10-CM | POA: Insufficient documentation

## 2023-03-05 DIAGNOSIS — Z79899 Other long term (current) drug therapy: Secondary | ICD-10-CM | POA: Diagnosis not present

## 2023-03-05 DIAGNOSIS — J449 Chronic obstructive pulmonary disease, unspecified: Secondary | ICD-10-CM | POA: Insufficient documentation

## 2023-03-05 DIAGNOSIS — K635 Polyp of colon: Secondary | ICD-10-CM | POA: Diagnosis not present

## 2023-03-05 HISTORY — PX: COLONOSCOPY WITH PROPOFOL: SHX5780

## 2023-03-05 HISTORY — DX: Type 2 diabetes mellitus without complications: E11.9

## 2023-03-05 LAB — GLUCOSE, CAPILLARY: Glucose-Capillary: 107 mg/dL — ABNORMAL HIGH (ref 70–99)

## 2023-03-05 SURGERY — COLONOSCOPY WITH PROPOFOL
Anesthesia: General

## 2023-03-05 MED ORDER — ONDANSETRON HCL 4 MG/2ML IJ SOLN
INTRAMUSCULAR | Status: AC
  Filled 2023-03-05: qty 2

## 2023-03-05 MED ORDER — PROPOFOL 10 MG/ML IV BOLUS
INTRAVENOUS | Status: DC | PRN
  Administered 2023-03-05: 60 mg via INTRAVENOUS

## 2023-03-05 MED ORDER — PROPOFOL 500 MG/50ML IV EMUL
INTRAVENOUS | Status: DC | PRN
  Administered 2023-03-05: 150 ug/kg/min via INTRAVENOUS
  Administered 2023-03-05: 125 ug/kg/min via INTRAVENOUS

## 2023-03-05 MED ORDER — SODIUM CHLORIDE 0.9 % IV SOLN
INTRAVENOUS | Status: DC
  Administered 2023-03-05: 20 mL/h via INTRAVENOUS

## 2023-03-05 MED ORDER — GLYCOPYRROLATE 0.2 MG/ML IJ SOLN
INTRAMUSCULAR | Status: AC
  Filled 2023-03-05: qty 1

## 2023-03-05 MED ORDER — PROPOFOL 1000 MG/100ML IV EMUL
INTRAVENOUS | Status: AC
  Filled 2023-03-05: qty 100

## 2023-03-05 MED ORDER — GLYCOPYRROLATE 0.2 MG/ML IJ SOLN
INTRAMUSCULAR | Status: DC | PRN
  Administered 2023-03-05: .1 mg via INTRAVENOUS

## 2023-03-05 MED ORDER — PROPOFOL 10 MG/ML IV BOLUS: INTRAVENOUS | Status: DC | PRN

## 2023-03-05 NOTE — Interval H&P Note (Signed)
History and Physical Interval Note: Preprocedure H&P from 03/05/23  was reviewed and there was no interval change after seeing and examining the patient.  Written consent was obtained from the patient after discussion of risks, benefits, and alternatives. Patient has consented to proceed with Colonoscopy with possible intervention   03/05/2023 6:49 AM  Sherry Petersen  has presented today for surgery, with the diagnosis of Colon Cancer Screening.  The various methods of treatment have been discussed with the patient and family. After consideration of risks, benefits and other options for treatment, the patient has consented to  Procedure(s): COLONOSCOPY WITH PROPOFOL (N/A) as a surgical intervention.  The patient's history has been reviewed, patient examined, no change in status, stable for surgery.  I have reviewed the patient's chart and labs.  Questions were answered to the patient's satisfaction.     Annamaria Helling

## 2023-03-05 NOTE — Transfer of Care (Signed)
Immediate Anesthesia Transfer of Care Note  Patient: Sherry Petersen  Procedure(s) Performed: COLONOSCOPY WITH PROPOFOL  Patient Location: PACU  Anesthesia Type:General  Level of Consciousness: awake  Airway & Oxygen Therapy: Patient Spontanous Breathing  Post-op Assessment: Report given to RN, Post -op Vital signs reviewed and stable, and Patient moving all extremities  Post vital signs: Reviewed and stable  Last Vitals:  Vitals Value Taken Time  BP 150/78 03/05/23 0758  Temp 36.1 C 03/05/23 0758  Pulse 67 03/05/23 0758  Resp 13 03/05/23 0758  SpO2 100 % 03/05/23 0758    Last Pain:  Vitals:   03/05/23 0758  TempSrc: Temporal  PainSc: 0-No pain         Complications: No notable events documented.

## 2023-03-05 NOTE — Anesthesia Postprocedure Evaluation (Signed)
Anesthesia Post Note  Patient: Sherry Petersen  Procedure(s) Performed: COLONOSCOPY WITH PROPOFOL  Patient location during evaluation: PACU Anesthesia Type: General Level of consciousness: awake and oriented Pain management: pain level controlled Vital Signs Assessment: post-procedure vital signs reviewed and stable Respiratory status: spontaneous breathing Cardiovascular status: stable Anesthetic complications: no   No notable events documented.   Last Vitals:  Vitals:   03/05/23 0808 03/05/23 0815  BP: (!) 169/96 (!) 165/89  Pulse: (!) 55 (!) 50  Resp:  13  Temp:    SpO2: 100% 100%    Last Pain:  Vitals:   03/05/23 0815  TempSrc:   PainSc: 0-No pain                 VAN STAVEREN,Lyn Joens

## 2023-03-05 NOTE — Anesthesia Preprocedure Evaluation (Signed)
Anesthesia Evaluation  Patient identified by MRN, date of birth, ID band Patient awake    Reviewed: Allergy & Precautions, NPO status , Patient's Chart, lab work & pertinent test results  Airway Mallampati: II  TM Distance: >3 FB Neck ROM: Full    Dental  (+) Partial Upper, Partial Lower   Pulmonary neg pulmonary ROS, COPD, former smoker   Pulmonary exam normal  + decreased breath sounds      Cardiovascular Exercise Tolerance: Good hypertension, Pt. on medications negative cardio ROS Normal cardiovascular exam Rhythm:Regular     Neuro/Psych  Headaches  Anxiety     negative neurological ROS  negative psych ROS   GI/Hepatic negative GI ROS, Neg liver ROS,,,  Endo/Other  negative endocrine ROSdiabetes, Well Controlled, Type 2    Renal/GU negative Renal ROS  negative genitourinary   Musculoskeletal  (+) Arthritis ,    Abdominal   Peds negative pediatric ROS (+)  Hematology negative hematology ROS (+)   Anesthesia Other Findings Past Medical History: No date: Anxiety No date: Diabetes mellitus without complication ([place]) No date: Hematuria     Comment:  CT Scan normal  Past Surgical History: 2003: CARPAL TUNNEL RELEASE 2012: TONSILLECTOMY 2002: VAGINAL HYSTERECTOMY  BMI    Body Mass Index: 19.01 kg/m      Reproductive/Obstetrics negative OB ROS                             Anesthesia Physical Anesthesia Plan  ASA: 3  Anesthesia Plan: General   Post-op Pain Management:    Induction: Intravenous  PONV Risk Score and Plan: Propofol infusion and TIVA  Airway Management Planned: Natural Airway  Additional Equipment:   Intra-op Plan:   Post-operative Plan:   Informed Consent: I have reviewed the patients History and Physical, chart, labs and discussed the procedure including the risks, benefits and alternatives for the proposed anesthesia with the patient or authorized  representative who has indicated his/her understanding and acceptance.     Dental Advisory Given  Plan Discussed with: CRNA and Surgeon  Anesthesia Plan Comments:        Anesthesia Quick Evaluation

## 2023-03-05 NOTE — Op Note (Signed)
[facility] Patient Name: Sherry Petersen Procedure Date: 03/05/2023 7:12 AM MRN: [id number] Account #: [id number] Date of Birth: 11/29/1961 Admit Type: Outpatient Age: 62 Room: [facility] ENDO ROOM 1 Gender: Female Note Status: Finalized Instrument Name: Peds Colonoscope [id number] Procedure:             Colonoscopy Indications:           Screening for colorectal malignant neoplasm Providers:             Annamaria Helling DO, DO Referring MD:          Deborra Medina, MD (Referring MD) Medicines:             Monitored Anesthesia Care Complications:         No immediate complications. Estimated blood loss:                         Minimal. Procedure:             Pre-Anesthesia Assessment:                        - Prior to the procedure, a History and Physical was                         performed, and patient medications and allergies were                         reviewed. The patient is competent. The risks and                         benefits of the procedure and the sedation options and                         risks were discussed with the patient. All questions                         were answered and informed consent was obtained.                         Patient identification and proposed procedure were                         verified by the physician, the nurse, the anesthetist                         and the technician in the endoscopy suite. Mental                         Status Examination: alert and oriented. Airway                         Examination: normal oropharyngeal airway and neck                         mobility. Respiratory Examination: clear to                         auscultation. CV Examination: RRR, no murmurs, no S3  or S4. Prophylactic Antibiotics: The patient does not                         require prophylactic antibiotics. Prior                         Anticoagulants: The patient has taken no anticoagulant                          or antiplatelet agents. ASA Grade Assessment: III - A                         patient with severe systemic disease. After reviewing                         the risks and benefits, the patient was deemed in                         satisfactory condition to undergo the procedure. The                         anesthesia plan was to use monitored anesthesia care                         (MAC). Immediately prior to administration of                         medications, the patient was re-assessed for adequacy                         to receive sedatives. The heart rate, respiratory                         rate, oxygen saturations, blood pressure, adequacy of                         pulmonary ventilation, and response to care were                         monitored throughout the procedure. The physical                         status of the patient was re-assessed after the                         procedure.                        After obtaining informed consent, the colonoscope was                         passed under direct vision. Throughout the procedure,                         the patient's blood pressure, pulse, and oxygen                         saturations were monitored continuously. The  Colonoscope was introduced through the anus and                         advanced to the the terminal ileum, with                         identification of the appendiceal orifice and IC                         valve. The colonoscopy was somewhat difficult due to                         inadequate bowel prep. Successful completion of the                         procedure was aided by lavage. The patient tolerated                         the procedure well. The quality of the bowel                         preparation was inadequate. The terminal ileum,                         ileocecal valve, appendiceal orifice, and rectum were                          photographed. Findings:      Hemorrhoids were found on perianal exam.      The digital rectal exam was normal. Pertinent negatives include normal       sphincter tone.      Two sessile polyps were found in the ascending colon and cecum. The       polyps were 4 to 6 mm in size. These polyps were removed with a cold       snare. Resection and retrieval were complete. Estimated blood loss was       minimal.      The terminal ileum appeared normal.      A large amount of extensive amounts of semi-liquid semi-solid frequently       clogged stool was found in the entire colon, precluding visualization.       Scope was frequently clogged with food residue and stool. Estimated       blood loss: none.      Much of the mucosa was not seen due to stool burden and food residue       that was difficult to suction. Impression:            - Preparation of the colon was inadequate.                        - Hemorrhoids found on perianal exam.                        - Two 4 to 6 mm polyps in the ascending colon and in                         the cecum, removed with a cold snare. Resected and  retrieved.                        - The examined portion of the ileum was normal.                        - Stool in the entire examined colon. Recommendation:        - Patient has a contact number available for                         emergencies. The signs and symptoms of potential                         delayed complications were discussed with the patient.                         Return to normal activities tomorrow. Written                         discharge instructions were provided to the patient.                        - Discharge patient to home.                        - Resume previous diet.                        - Continue present medications.                        - No ibuprofen, naproxen, or other non-steroidal                         anti-inflammatory drugs for 5 days after  polyp removal.                        - Await pathology results.                        - Repeat colonoscopy 6-12 months because the bowel                         preparation was poor.                        - Return to GI office to address constipation issues.                        - The findings and recommendations were discussed with                         the patient. Procedure Code(s):     --- Professional ---                        [id number], Colonoscopy, flexible; with removal of                         tumor(s), polyp(s), or other lesion(s) by snare  technique Diagnosis Code(s):     --- Professional ---                        Z12.11, Encounter for screening for malignant neoplasm                         of colon                        K64.9, Unspecified hemorrhoids                        D12.2, Benign neoplasm of ascending colon                        D12.0, Benign neoplasm of cecum CPT copyright 2022 American [employer] Association. All rights reserved. The codes documented in this report are preliminary and upon coder review may  be revised to meet current compliance requirements. Attending Participation:      I personally performed the entire procedure. Volney American, DO Annamaria Helling DO, DO 03/05/2023 8:03:00 AM This report has been signed electronically. Number of Addenda: 0 Note Initiated On: 03/05/2023 7:12 AM Scope Withdrawal Time: 0 hours 12 minutes 29 seconds  Total Procedure Duration: 0 hours 19 minutes 53 seconds  Estimated Blood Loss:  Estimated blood loss was minimal.      [facility]

## 2023-03-06 NOTE — Progress Notes (Signed)
Got Message that Call could not be completed X2 Attempts.

## 2023-03-08 ENCOUNTER — Encounter: Payer: Self-pay | Admitting: Gastroenterology

## 2023-03-08 LAB — SURGICAL PATHOLOGY

## 2023-04-21 ENCOUNTER — Other Ambulatory Visit: Payer: Self-pay | Admitting: Internal Medicine

## 2023-05-06 ENCOUNTER — Ambulatory Visit: Payer: BC Managed Care – PPO | Admitting: Internal Medicine

## 2023-05-06 ENCOUNTER — Encounter: Payer: Self-pay | Admitting: Internal Medicine

## 2023-05-06 VITALS — BP 132/82 | HR 56 | Ht 61.0 in | Wt 104.0 lb

## 2023-05-06 DIAGNOSIS — F5105 Insomnia due to other mental disorder: Secondary | ICD-10-CM

## 2023-05-06 DIAGNOSIS — M818 Other osteoporosis without current pathological fracture: Secondary | ICD-10-CM | POA: Diagnosis not present

## 2023-05-06 DIAGNOSIS — E782 Mixed hyperlipidemia: Secondary | ICD-10-CM | POA: Diagnosis not present

## 2023-05-06 DIAGNOSIS — F411 Generalized anxiety disorder: Secondary | ICD-10-CM | POA: Diagnosis not present

## 2023-05-06 DIAGNOSIS — F419 Anxiety disorder, unspecified: Secondary | ICD-10-CM

## 2023-05-06 DIAGNOSIS — D126 Benign neoplasm of colon, unspecified: Secondary | ICD-10-CM

## 2023-05-06 DIAGNOSIS — J189 Pneumonia, unspecified organism: Secondary | ICD-10-CM

## 2023-05-06 DIAGNOSIS — E1169 Type 2 diabetes mellitus with other specified complication: Secondary | ICD-10-CM | POA: Diagnosis not present

## 2023-05-06 DIAGNOSIS — F410 Panic disorder [episodic paroxysmal anxiety] without agoraphobia: Secondary | ICD-10-CM

## 2023-05-06 DIAGNOSIS — I10 Essential (primary) hypertension: Secondary | ICD-10-CM

## 2023-05-06 LAB — COMPREHENSIVE METABOLIC PANEL
ALT: 10 U/L (ref 0–35)
AST: 19 U/L (ref 0–37)
Albumin: 4.4 g/dL (ref 3.5–5.2)
Alkaline Phosphatase: 70 U/L (ref 39–117)
BUN: 28 mg/dL — ABNORMAL HIGH (ref 6–23)
CO2: 25 mEq/L (ref 19–32)
Calcium: 9.3 mg/dL (ref 8.4–10.5)
Chloride: 108 mEq/L (ref 96–112)
Creatinine, Ser: 0.74 mg/dL (ref 0.40–1.20)
GFR: 85.54 mL/min (ref >60.00)
Glucose, Bld: 79 mg/dL (ref 70–99)
Potassium: 4.3 mEq/L (ref 3.5–5.1)
Sodium: 142 mEq/L (ref 135–145)
Total Bilirubin: 0.4 mg/dL (ref 0.2–1.2)
Total Protein: 6.3 g/dL (ref 6.0–8.3)

## 2023-05-06 LAB — LIPID PANEL
Cholesterol: 199 mg/dL (ref 0–200)
HDL: 79.4 mg/dL (ref >39.00)
LDL Cholesterol: 111 mg/dL — ABNORMAL HIGH (ref 0–99)
NonHDL: 119.35
Total CHOL/HDL Ratio: 3
Triglycerides: 40 mg/dL (ref 0.0–149.0)
VLDL: 8 mg/dL (ref 0.0–40.0)

## 2023-05-06 LAB — HEMOGLOBIN A1C: Hgb A1c MFr Bld: 6.6 % — ABNORMAL HIGH (ref 4.6–6.5)

## 2023-05-06 LAB — MICROALBUMIN / CREATININE URINE RATIO
Creatinine,U: 51.1 mg/dL
Microalb Creat Ratio: 1.4 mg/g (ref 0.0–30.0)
Microalb, Ur: <0.7 mg/dL (ref 0.0–1.9)

## 2023-05-06 MED ORDER — SERTRALINE HCL 100 MG PO TABS: 100.0000 mg | ORAL_TABLET | Freq: Every day | ORAL | 1 refills | Status: DC

## 2023-05-06 MED ORDER — AMLODIPINE BESYLATE 2.5 MG PO TABS: 2.5000 mg | ORAL_TABLET | Freq: Every day | ORAL | 1 refills | Status: DC

## 2023-05-06 MED ORDER — PANTOPRAZOLE SODIUM 40 MG PO TBEC: 40.0000 mg | DELAYED_RELEASE_TABLET | Freq: Every day | ORAL | 1 refills | Status: DC

## 2023-05-06 NOTE — Patient Instructions (Addendum)
Try adding Benefiber to your beverage   daily   Continue weekly BP checks.  Send me a message if reading are consistently > 130/80   Fasting sugars should be < 125   Vilma Meckel now makes a frozen breakfast frittata that can be microwaved in 2 minutes and is very low carb. Frittats are similar to quiches without the crust   [employer] ! Makes a low carb spinach & egg white frittata     Recommend[ Low Carb high Protein premixed Shakes:   Premier Protein  Atkins Advantage Muscle Milk EAS AdvantEdge   All of these are available at [employer], [employer],  [employer], and [employer]  And taste good    YOU NEED A "DIABETIC EYE EXAM"  EVERY YEAR .  THIS WILL INCLUDE DILATING YOUR EYES

## 2023-05-06 NOTE — Progress Notes (Signed)
Subjective:  Patient ID: Sherry Petersen, female    DOB: 1961/03/26  Age: 62 y.o. MRN: [id number]  CC: The primary encounter diagnosis was Tubular adenoma of colon. Diagnoses of DM type 2 with diabetic mixed hyperlipidemia (HCC), Other osteoporosis without current pathological fracture, Generalized anxiety disorder with panic attacks, Essential hypertension, Insomnia secondary to anxiety, and Community acquired pneumonia of left lower lobe of lung were also pertinent to this visit.   HPI [name] presents for  Chief Complaint  Patient presents with   Medical Management of Chronic Issues   1) type 2 DM:  She feels generally well, is walking  several times per week and checking blood sugars once daily at variable times.  BS have been 90 to 120  under 130 fasting and < 150 post prandially.  Denies any recent hypoglyemic events.  Following a carbohydrate modified diet 6 days per week. Appetite is good.   She has a  newly reported bilateral numbness of the feet for the last month, described as occurring only at night and and not activity limiting. .  Both great toenails have developed discoloration one the lateral side , preceded by bleeding under the nail.  She has cut the nail down on the sides to remove the dried blood. Home BS readings  as low as 70  usually  90 to 120   2) GAD:  taking sertraline 100 mg daily and alprazolam prn insomnia   3) h/o Tobacco abuse  quit 2 years ago   4) HTN:  Hypertension: patient checks blood pressure twice weekly at home.  Readings have been for the most part < 140/80 at rest . Patient is following a reduce salt diet most days and is taking medications as prescribed (amlodipine 2.5 mg daily )   5) chronic constipation: moves bowels 1-2 times per week.   6) Colon  Ca screening  done March 2024 : had an incomplete prep for colonoscopy   due  to intolerance of prep resulting in emesis .  One polyp retrieved     Outpatient Medications Prior to Visit   Medication Sig Dispense Refill   ALPRAZolam (XANAX) 0.5 MG tablet TAKE 1 TABLET BY MOUTH AT BEDTIME AS NEEDED FOR ANXIETY 30 tablet 5   atorvastatin (LIPITOR) 40 MG tablet Take 1 tablet by mouth once daily 90 tablet 1   Lactulose 20 GM/30ML SOLN 30 ml every 4 hours until constipation is relieved 236 mL 3   amLODipine (NORVASC) 2.5 MG tablet Take 1 tablet by mouth once daily 90 tablet 1   ciprofloxacin (CIPRO) 500 MG tablet Take 500 mg by mouth 2 (two) times daily.     metroNIDAZOLE (FLAGYL) 500 MG tablet Take 500 mg by mouth 3 (three) times daily.     pantoprazole (PROTONIX) 40 MG tablet Take 1 tablet (40 mg total) by mouth daily. 30 tablet 3   sertraline (ZOLOFT) 100 MG tablet Take 1 tablet (100 mg total) by mouth daily. 90 tablet 1   traMADol (ULTRAM) 50 MG tablet Take by mouth.     No facility-administered medications prior to visit.    Review of Systems;  Patient denies headache, fevers, malaise, unintentional weight loss, skin rash, eye pain, sinus congestion and sinus pain, sore throat, dysphagia,  hemoptysis , cough, dyspnea, wheezing, chest pain, palpitations, orthopnea, edema, abdominal pain, nausea, melena, diarrhea, constipation, flank pain, dysuria, hematuria, urinary  Frequency, nocturia, numbness, tingling, seizures,  Focal weakness, Loss of consciousness,  Tremor, insomnia, depression, anxiety,  and suicidal ideation.      Objective:  BP 132/82   Pulse (!) 56   Ht 5\' 1"  (1.549 m)   Wt 104 lb (47.2 kg)   SpO2 95%   BMI 19.65 kg/m   BP Readings from Last 3 Encounters:  05/06/23 132/82  03/05/23 (!) 165/89  01/28/23 (!) 140/86    Wt Readings from Last 3 Encounters:  05/06/23 104 lb (47.2 kg)  03/05/23 100 lb 9.6 oz (45.6 kg)  01/28/23 103 lb 3.2 oz (46.8 kg)    Physical Exam Vitals reviewed.  Constitutional:      General: She is not in acute distress.    Appearance: Normal appearance. She is normal weight. She is not ill-appearing, toxic-appearing or  diaphoretic.  HENT:     Head: Normocephalic.  Eyes:     General: No scleral icterus.       Right eye: No discharge.        Left eye: No discharge.     Conjunctiva/sclera: Conjunctivae normal.  Cardiovascular:     Rate and Rhythm: Normal rate and regular rhythm.     Heart sounds: Normal heart sounds.  Pulmonary:     Effort: Pulmonary effort is normal. No respiratory distress.     Breath sounds: Normal breath sounds.  Musculoskeletal:        General: Normal range of motion.  Skin:    General: Skin is warm and dry.  Neurological:     General: No focal deficit present.     Mental Status: She is alert and oriented to person, place, and time. Mental status is at baseline.  Psychiatric:        Mood and Affect: Mood normal.        Behavior: Behavior normal.        Thought Content: Thought content normal.        Judgment: Judgment normal.   Lab Results  Component Value Date   HGBA1C 6.6 (H) 05/06/2023   HGBA1C 6.6 (H) 08/03/2022   HGBA1C 6.4 03/14/2012    Lab Results  Component Value Date   CREATININE 0.74 05/06/2023   CREATININE 0.75 01/28/2023   CREATININE 0.87 08/03/2022    Lab Results  Component Value Date   WBC 3.8 (L) 01/28/2023   HGB 13.1 01/28/2023   HCT 39.4 01/28/2023   PLT 240.0 01/28/2023   GLUCOSE 79 05/06/2023   CHOL 199 05/06/2023   TRIG 40.0 05/06/2023   HDL 79.40 05/06/2023   LDLDIRECT 141.0 08/03/2022   LDLCALC 111 (H) 05/06/2023   ALT 10 05/06/2023   AST 19 05/06/2023   NA 142 05/06/2023   K 4.3 05/06/2023   CL 108 05/06/2023   CREATININE 0.74 05/06/2023   BUN 28 (H) 05/06/2023   CO2 25 05/06/2023   TSH 1.03 08/03/2022   HGBA1C 6.6 (H) 05/06/2023   MICROALBUR <0.7 05/06/2023    No results found.  Assessment & Plan:  .Tubular adenoma of colon Assessment & Plan: Repeat colonoscopy is due in 2025 due to incomplete prep    DM type 2 with diabetic mixed hyperlipidemia (HCC) Assessment & Plan:  Historically well-controlled on diet alone  .  hemoglobin A1c has been consistently at or  less than 7.0 . Patient is up-to-date on eye exams and foot exam is normal today. Patient has no microalbuminuria. Patient is tolerating statin therapy for CAD risk reduction   Lab Results  Component Value Date   HGBA1C 6.0 06/13/2014   Lab Results  Component Value Date  MICROALBUR 0.2 06/13/2014     Lab Results  Component Value Date   HGBA1C 6.6 (H) 05/06/2023   Lab Results  Component Value Date   MICROALBUR <0.7 05/06/2023   MICROALBUR <0.7 08/19/2022   Lab Results  Component Value Date   CHOL 199 05/06/2023   HDL 79.40 05/06/2023   LDLCALC 111 (H) 05/06/2023   LDLDIRECT 141.0 08/03/2022   TRIG 40.0 05/06/2023   CHOLHDL 3 05/06/2023      Orders: -     Hemoglobin A1c -     Lipid panel -     Comprehensive metabolic panel -     Microalbumin / creatinine urine ratio  Other osteoporosis without current pathological fracture Assessment & Plan: Managed with weekly therapy with  alendronate. Calcium and Vit D requirements discussed as well.    Generalized anxiety disorder with panic attacks Assessment & Plan: Managed with  sertraline 100 mg daily and limited use of alprazolam tonce daily.   The risks and benefits of benzodiazepine use were discussed with patient today including excessive sedation leading to respiratory depression,  impaired thinking/driving, and addiction.  Patient was advised to avoid concurrent use with alcohol, to use medication only as needed and not to share with others  .    Essential hypertension Assessment & Plan: Well controlled on current regimen. She has no microalbuminuria.  no changes today.   Lab Results  Component Value Date   CREATININE 0.74 05/06/2023   Lab Results  Component Value Date   MICROALBUR <0.7 05/06/2023   MICROALBUR <0.7 08/19/2022        Insomnia secondary to anxiety  Community acquired pneumonia of left lower lobe of lung Assessment & Plan: She has not had a  follow up  x ray to ensure resolution of the LLL .  Ordered today  Orders: -     DG Chest 2 View; Future  Other orders -     amLODIPine Besylate; Take 1 tablet (2.5 mg total) by mouth daily.  Dispense: 90 tablet; Refill: 1 -     Pantoprazole Sodium; Take 1 tablet (40 mg total) by mouth daily.  Dispense: 90 tablet; Refill: 1 -     Sertraline HCl; Take 1 tablet (100 mg total) by mouth daily.  Dispense: 90 tablet; Refill: 1     I provided 31  minutes of face-to-face time during this encounter reviewing patient's last visit with me, patient's  most recent visit with gastroenterology,  recent surgical and non surgical procedures, previous  labs and imaging studies, counseling on currently addressed issues,  and post visit ordering to diagnostics and therapeutics .   Follow-up: Return in about 6 months (around 11/06/2023) for follow up diabetes, physical.   Sherlene Shams, MD

## 2023-05-07 NOTE — Assessment & Plan Note (Signed)
Repeat colonoscopy is due in 2025 due to incomplete prep

## 2023-05-07 NOTE — Assessment & Plan Note (Signed)
She has not had a follow up  x ray to ensure resolution of the LLL .  Ordered today

## 2023-05-07 NOTE — Assessment & Plan Note (Signed)
Well controlled on current regimen. She has no microalbuminuria.  no changes today.   Lab Results  Component Value Date   CREATININE 0.74 05/06/2023   Lab Results  Component Value Date   MICROALBUR <0.7 05/06/2023   MICROALBUR <0.7 08/19/2022

## 2023-05-07 NOTE — Assessment & Plan Note (Signed)
Historically well-controlled on diet alone .  hemoglobin A1c has been consistently at or  less than 7.0 . Patient is up-to-date on eye exams and foot exam is normal today. Patient has no microalbuminuria. Patient is tolerating statin therapy for CAD risk reduction   Lab Results  Component Value Date   HGBA1C 6.0 06/13/2014   Lab Results  Component Value Date   MICROALBUR 0.2 06/13/2014     Lab Results  Component Value Date   HGBA1C 6.6 (H) 05/06/2023   Lab Results  Component Value Date   MICROALBUR <0.7 05/06/2023   MICROALBUR <0.7 08/19/2022   Lab Results  Component Value Date   CHOL 199 05/06/2023   HDL 79.40 05/06/2023   LDLCALC 111 (H) 05/06/2023   LDLDIRECT 141.0 08/03/2022   TRIG 40.0 05/06/2023   CHOLHDL 3 05/06/2023

## 2023-05-07 NOTE — Assessment & Plan Note (Signed)
Managed with weekly therapy with  alendronate. Calcium and Vit D requirements discussed as well.

## 2023-05-07 NOTE — Assessment & Plan Note (Signed)
Managed with  sertraline 100 mg daily and limited use of alprazolam tonce daily.   The risks and benefits of benzodiazepine use were discussed with patient today including excessive sedation leading to respiratory depression,  impaired thinking/driving, and addiction.  Patient was advised to avoid concurrent use with alcohol, to use medication only as needed and not to share with others  .

## 2023-07-06 DIAGNOSIS — K5909 Other constipation: Secondary | ICD-10-CM | POA: Diagnosis not present

## 2023-07-06 DIAGNOSIS — Z86010 Personal history of colonic polyps: Secondary | ICD-10-CM | POA: Diagnosis not present

## 2023-07-29 ENCOUNTER — Encounter (INDEPENDENT_AMBULATORY_CARE_PROVIDER_SITE_OTHER): Payer: Self-pay

## 2023-08-25 ENCOUNTER — Other Ambulatory Visit: Payer: Self-pay | Admitting: Internal Medicine

## 2023-09-10 ENCOUNTER — Ambulatory Visit: Payer: BC Managed Care – PPO | Admitting: Internal Medicine

## 2023-09-23 ENCOUNTER — Other Ambulatory Visit: Payer: Self-pay | Admitting: Internal Medicine

## 2023-09-30 DIAGNOSIS — E119 Type 2 diabetes mellitus without complications: Secondary | ICD-10-CM | POA: Diagnosis not present

## 2023-09-30 LAB — HM DIABETES EYE EXAM

## 2023-10-04 ENCOUNTER — Encounter: Payer: Self-pay | Admitting: Internal Medicine

## 2023-10-04 ENCOUNTER — Ambulatory Visit: Payer: BC Managed Care – PPO

## 2023-10-04 ENCOUNTER — Ambulatory Visit: Payer: BC Managed Care – PPO | Admitting: Internal Medicine

## 2023-10-04 VITALS — BP 130/72 | HR 66 | Ht 61.0 in | Wt 106.8 lb

## 2023-10-04 DIAGNOSIS — F411 Generalized anxiety disorder: Secondary | ICD-10-CM

## 2023-10-04 DIAGNOSIS — Z1231 Encounter for screening mammogram for malignant neoplasm of breast: Secondary | ICD-10-CM | POA: Diagnosis not present

## 2023-10-04 DIAGNOSIS — M5090 Cervical disc disorder, unspecified, unspecified cervical region: Secondary | ICD-10-CM

## 2023-10-04 DIAGNOSIS — Z23 Encounter for immunization: Secondary | ICD-10-CM

## 2023-10-04 DIAGNOSIS — M4722 Other spondylosis with radiculopathy, cervical region: Secondary | ICD-10-CM

## 2023-10-04 DIAGNOSIS — E782 Mixed hyperlipidemia: Secondary | ICD-10-CM | POA: Diagnosis not present

## 2023-10-04 DIAGNOSIS — I10 Essential (primary) hypertension: Secondary | ICD-10-CM | POA: Diagnosis not present

## 2023-10-04 DIAGNOSIS — F410 Panic disorder [episodic paroxysmal anxiety] without agoraphobia: Secondary | ICD-10-CM

## 2023-10-04 DIAGNOSIS — F419 Anxiety disorder, unspecified: Secondary | ICD-10-CM

## 2023-10-04 DIAGNOSIS — E1169 Type 2 diabetes mellitus with other specified complication: Secondary | ICD-10-CM | POA: Diagnosis not present

## 2023-10-04 DIAGNOSIS — M818 Other osteoporosis without current pathological fracture: Secondary | ICD-10-CM

## 2023-10-04 DIAGNOSIS — F5105 Insomnia due to other mental disorder: Secondary | ICD-10-CM

## 2023-10-04 DIAGNOSIS — K2950 Unspecified chronic gastritis without bleeding: Secondary | ICD-10-CM

## 2023-10-04 DIAGNOSIS — Z0001 Encounter for general adult medical examination with abnormal findings: Secondary | ICD-10-CM | POA: Diagnosis not present

## 2023-10-04 MED ORDER — AMLODIPINE BESYLATE 2.5 MG PO TABS: 2.5000 mg | ORAL_TABLET | Freq: Every day | ORAL | 1 refills | Status: DC

## 2023-10-04 MED ORDER — ATORVASTATIN CALCIUM 40 MG PO TABS: 40.0000 mg | ORAL_TABLET | Freq: Every day | ORAL | 1 refills | Status: DC

## 2023-10-04 MED ORDER — SERTRALINE HCL 100 MG PO TABS: 100.0000 mg | ORAL_TABLET | Freq: Every day | ORAL | 1 refills | Status: DC

## 2023-10-04 NOTE — Progress Notes (Unsigned)
Patient ID: Sherry Petersen, female    DOB: Mar 17, 1961  Age: 62 y.o. MRN: [id number]  The patient is here for annual preventive examination and management of other chronic and acute problems.   The risk factors are reflected in the social history.   The roster of all physicians providing medical care to patient - is listed in the Snapshot section of the chart.   Activities of daily living:  The patient is 100% independent in all ADLs: dressing, toileting, feeding as well as independent mobility   Home safety : The patient has smoke detectors in the home. They wear seatbelts.  There are no unsecured firearms at home. There is no violence in the home.    There is no risks for hepatitis, STDs or HIV. There is no   history of blood transfusion. They have no travel history to infectious disease endemic areas of the world.   The patient has seen their dentist in the last six month. They have seen their eye doctor in the last year. The patinet  denies slight hearing difficulty with regard to whispered voices and some television programs.  They have deferred audiologic testing in the last year.  They do not  have excessive sun exposure. Discussed the need for sun protection: hats, long sleeves and use of sunscreen if there is significant sun exposure.    Diet: the importance of a healthy diet is discussed. They do have a healthy diet.   The benefits of regular aerobic exercise were discussed. The patient  exercises  3 to 5 days per week  for  60 minutes.    Depression screen: there are no signs or vegative symptoms of depression- irritability, change in appetite, anhedonia, sadness/tearfullness.   The following portions of the patient's history were reviewed and updated as appropriate: allergies, current medications, past family history, past medical history,  past surgical history, past social history  and problem list.   Visual acuity was not assessed per patient preference since the patient has  regular follow up with an  ophthalmologist. Hearing and body mass index were assessed and reviewed.    During the course of the visit the patient was educated and counseled about appropriate screening and preventive services including : fall prevention , diabetes screening, nutrition counseling, colorectal cancer screening, and recommended immunizations.    Chief Complaint:   1) GAD:  2( right leg intermittently numb from hip to ankle when lying supine on couch.  Resolves with standing  up and walking.  No foot drop . Works on a Programmer, multimedia at [employer] frequently has neck pain ,  and  thoracic paraspinal spasm.   Lies with head on the arm of the couch..  leg does not go numb n in bed .  But sleeps on stomach  Recurrent right parietal headache Also pain and spasm between the shoulder blades.  Taking ibuprofen and goodys' for the headaches.   2) history of tobacco ,  none in 2 years    3) fasting sugars have been  80 to 93   Review of Symptoms  Patient denies headache, fevers, malaise, unintentional weight loss, skin rash, eye pain, sinus congestion and sinus pain, sore throat, dysphagia,  hemoptysis , cough, dyspnea, wheezing, chest pain, palpitations, orthopnea, edema, abdominal pain, nausea, melena, diarrhea, constipation, flank pain, dysuria, hematuria, urinary  Frequency, nocturia, numbness, tingling, seizures,  Focal weakness, Loss of consciousness,  Tremor, insomnia, depression, anxiety, and suicidal ideation.    Physical  Exam:  BP 130/72   Pulse 66   Ht 5\' 1"  (1.549 m)   Wt 106 lb 12.8 oz (48.4 kg)   SpO2 97%   BMI 20.18 kg/m    Physical Exam Vitals reviewed.  Constitutional:      General: She is not in acute distress.    Appearance: Normal appearance. She is normal weight. She is not ill-appearing, toxic-appearing or diaphoretic.  HENT:     Head: Normocephalic.  Eyes:     General: No scleral icterus.       Right eye: No discharge.        Left eye:  No discharge.     Conjunctiva/sclera: Conjunctivae normal.  Cardiovascular:     Rate and Rhythm: Normal rate and regular rhythm.     Heart sounds: Normal heart sounds.  Pulmonary:     Effort: Pulmonary effort is normal. No respiratory distress.     Breath sounds: Normal breath sounds.  Musculoskeletal:        General: Normal range of motion.  Skin:    General: Skin is warm and dry.  Neurological:     General: No focal deficit present.     Mental Status: She is alert and oriented to person, place, and time. Mental status is at baseline.     Cranial Nerves: No cranial nerve deficit.     Sensory: No sensory deficit.     Motor: No weakness.     Coordination: Coordination normal.     Deep Tendon Reflexes: Reflexes normal.  Psychiatric:        Mood and Affect: Mood normal.        Behavior: Behavior normal.        Thought Content: Thought content normal.        Judgment: Judgment normal.     Assessment and Plan: Essential hypertension  DM type 2 with diabetic mixed hyperlipidemia (HCC)    No follow-ups on file.  Sherlene Shams, MD

## 2023-10-04 NOTE — Assessment & Plan Note (Signed)

## 2023-10-04 NOTE — Patient Instructions (Addendum)
Your leg numbness and headache may both be coming from arthritis and bone spurring in your cervical spine (neck)  plain x rays have been ordered    Do NOT use ibuprofen as your first choice.  Use tylenol first.    You can take up to 2000 mg of acetominophen (tylenol) every day safely  In divided doses (500 mg every 6 hours  Or 1000 mg every 12 hours.)

## 2023-10-05 LAB — COMPREHENSIVE METABOLIC PANEL
ALT: 10 U/L (ref 0–35)
AST: 20 U/L (ref 0–37)
Albumin: 4.3 g/dL (ref 3.5–5.2)
Alkaline Phosphatase: 57 U/L (ref 39–117)
BUN: 22 mg/dL (ref 6–23)
CO2: 26 meq/L (ref 19–32)
Calcium: 9.1 mg/dL (ref 8.4–10.5)
Chloride: 109 meq/L (ref 96–112)
Creatinine, Ser: 0.84 mg/dL (ref 0.40–1.20)
GFR: 73.26 mL/min (ref >60.00)
Glucose, Bld: 91 mg/dL (ref 70–99)
Potassium: 4.3 meq/L (ref 3.5–5.1)
Sodium: 144 meq/L (ref 135–145)
Total Bilirubin: 0.4 mg/dL (ref 0.2–1.2)
Total Protein: 6.4 g/dL (ref 6.0–8.3)

## 2023-10-05 LAB — LIPID PANEL
Cholesterol: 168 mg/dL (ref 0–200)
HDL: 70 mg/dL (ref >39.00)
LDL Cholesterol: 88 mg/dL (ref 0–99)
NonHDL: 98.32
Total CHOL/HDL Ratio: 2
Triglycerides: 50 mg/dL (ref 0.0–149.0)
VLDL: 10 mg/dL (ref 0.0–40.0)

## 2023-10-05 LAB — HEMOGLOBIN A1C: Hgb A1c MFr Bld: 6.7 % — ABNORMAL HIGH (ref 4.6–6.5)

## 2023-10-05 LAB — LDL CHOLESTEROL, DIRECT: Direct LDL: 79 mg/dL

## 2023-10-05 NOTE — Assessment & Plan Note (Signed)
Managed with  sertraline 100 mg daily and limited use of alprazolam to once daily.   The risks and benefits of benzodiazepine use were discussed with patient today including excessive sedation leading to respiratory depression,  impaired thinking/driving, and addiction.  Patient was advised to avoid concurrent use with alcohol, to use medication only as needed and not to share with others  .

## 2023-10-05 NOTE — Assessment & Plan Note (Addendum)
Advised to limit Goody's powders to one daily and NSAIDS to prn use  . Encouarged to max out tylenol first.  Continue PPI

## 2023-10-05 NOTE — Assessment & Plan Note (Signed)
Using alprazolam prn. 1/2 to 1 tablet . The risks and benefits of benzodiazepine use were discussed with patient today including excessive sedation leading to respiratory depression,  impaired thinking/driving, and addiction.  Patient was advised to avoid concurrent use with alcohol, to use medication only as needed and not to share with others  .

## 2023-10-05 NOTE — Assessment & Plan Note (Signed)
Historically well-controlled on diet alone .  hemoglobin A1c has been consistently at or  less than 7.0 . Fasting sugars reportedly are  < 100.  Patient is up-to-date on eye exams and foot exam is normal today. Patient has no microalbuminuria. Patient is tolerating statin therapy for CAD risk reduction .  Repeat labs due   Lab Results  Component Value Date   HGBA1C 6.0 06/13/2014   Lab Results  Component Value Date   MICROALBUR 0.2 06/13/2014     Lab Results  Component Value Date   HGBA1C 6.6 (H) 05/06/2023   Lab Results  Component Value Date   MICROALBUR <0.7 05/06/2023   MICROALBUR <0.7 08/19/2022   Lab Results  Component Value Date   CHOL 199 05/06/2023   HDL 79.40 05/06/2023   LDLCALC 111 (H) 05/06/2023   LDLDIRECT 141.0 08/03/2022   TRIG 40.0 05/06/2023   CHOLHDL 3 05/06/2023

## 2023-10-05 NOTE — Assessment & Plan Note (Addendum)
Managed with weekly therapy with  alendronate  since 2017, . Calcium and Vit D requirements discussed as well.

## 2023-10-05 NOTE — Assessment & Plan Note (Signed)
Likely the cause of the  frequent  stabbing right sided parietal headaches. The right leg numbness occurring with flexion of the cervical spine is worrisome for spinal stenosis.   She declines  use of muscle relaxer.  Lain films needed to evaluate disk space and bone spurring to discourage use of benzo for non anxiety issues .  Will consider EMG/Lyons studies as well

## 2023-10-15 ENCOUNTER — Ambulatory Visit
Admission: RE | Admit: 2023-10-15 | Discharge: 2023-10-15 | Disposition: A | Discharge status: Home / Self Care | Payer: BC Managed Care – PPO | Source: Ambulatory Visit | Attending: Internal Medicine | Admitting: Internal Medicine

## 2023-10-15 DIAGNOSIS — Z1231 Encounter for screening mammogram for malignant neoplasm of breast: Secondary | ICD-10-CM | POA: Insufficient documentation

## 2023-11-22 ENCOUNTER — Other Ambulatory Visit: Payer: Self-pay | Admitting: Internal Medicine

## 2023-11-22 NOTE — Telephone Encounter (Signed)
Refilled: 09/24/2023 Last OV: 10/04/2023 Next OV: not scheduled

## 2024-02-14 ENCOUNTER — Ambulatory Visit: Admit: 2024-02-14 | Payer: BC Managed Care – PPO | Admitting: Gastroenterology

## 2024-02-14 SURGERY — COLONOSCOPY WITH PROPOFOL
Anesthesia: General

## 2024-02-23 ENCOUNTER — Other Ambulatory Visit: Payer: Self-pay | Admitting: Internal Medicine

## 2024-03-03 ENCOUNTER — Encounter: Payer: Self-pay | Admitting: Internal Medicine

## 2024-03-03 ENCOUNTER — Telehealth: Payer: Self-pay | Admitting: Internal Medicine

## 2024-03-03 ENCOUNTER — Ambulatory Visit: Payer: BC Managed Care – PPO | Admitting: Internal Medicine

## 2024-03-03 VITALS — BP 140/68 | HR 50 | Ht 61.0 in | Wt 107.6 lb

## 2024-03-03 DIAGNOSIS — I10 Essential (primary) hypertension: Secondary | ICD-10-CM | POA: Diagnosis not present

## 2024-03-03 DIAGNOSIS — F5105 Insomnia due to other mental disorder: Secondary | ICD-10-CM

## 2024-03-03 DIAGNOSIS — E1169 Type 2 diabetes mellitus with other specified complication: Secondary | ICD-10-CM | POA: Diagnosis not present

## 2024-03-03 DIAGNOSIS — E785 Hyperlipidemia, unspecified: Secondary | ICD-10-CM

## 2024-03-03 DIAGNOSIS — M5412 Radiculopathy, cervical region: Secondary | ICD-10-CM

## 2024-03-03 DIAGNOSIS — E7800 Pure hypercholesterolemia, unspecified: Secondary | ICD-10-CM

## 2024-03-03 DIAGNOSIS — Z1211 Encounter for screening for malignant neoplasm of colon: Secondary | ICD-10-CM

## 2024-03-03 DIAGNOSIS — M4692 Unspecified inflammatory spondylopathy, cervical region: Secondary | ICD-10-CM

## 2024-03-03 DIAGNOSIS — M5090 Cervical disc disorder, unspecified, unspecified cervical region: Secondary | ICD-10-CM

## 2024-03-03 DIAGNOSIS — E782 Mixed hyperlipidemia: Secondary | ICD-10-CM

## 2024-03-03 DIAGNOSIS — F419 Anxiety disorder, unspecified: Secondary | ICD-10-CM

## 2024-03-03 MED ORDER — ATORVASTATIN CALCIUM 40 MG PO TABS: 40.0000 mg | ORAL_TABLET | Freq: Every day | ORAL | 1 refills | Status: DC

## 2024-03-03 MED ORDER — GABAPENTIN 100 MG PO CAPS: 100.0000 mg | ORAL_CAPSULE | Freq: Three times a day (TID) | ORAL | 3 refills | Status: AC

## 2024-03-03 MED ORDER — SERTRALINE HCL 100 MG PO TABS: 100.0000 mg | ORAL_TABLET | Freq: Every day | ORAL | 1 refills | Status: DC

## 2024-03-03 MED ORDER — AMLODIPINE BESYLATE 2.5 MG PO TABS: 2.5000 mg | ORAL_TABLET | Freq: Every day | ORAL | 1 refills | Status: DC

## 2024-03-03 NOTE — Telephone Encounter (Signed)
 Lft pt vm to call ofc to sch MRI. thanks

## 2024-03-03 NOTE — Progress Notes (Signed)
 Subjective:  Patient ID: Sherry Petersen, female    DOB: 1961/10/18  Age: 63 y.o. MRN: [id number]  CC: The primary encounter diagnosis was Colon cancer screening. Diagnoses of Essential hypertension, DM type 2 with diabetic mixed hyperlipidemia (HCC), Pure hypercholesterolemia, Cervical neck pain with evidence of disc disease, Cervical spondylitis with radiculitis (HCC), and Insomnia secondary to anxiety were also pertinent to this visit.   HPI REVERIE VAQUERA presents for  Chief Complaint  Patient presents with   Medical Management of Chronic Issues    Follow up on diabetes   Feels off balance when walking , catches herself leaning to the right.  Right elbow joint is painful  for the past 2-3 weeks and she has pain radiating from the neck to the chest wall and from the right arm to the wrist , without weakness .  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  Gait tested and normal.  Frequent headaches 2-3 /week,  usually frontal, occasionally occipital,  brought on by low blood sugars.  CBG last night was 70   this morning 83   Type 2 DM:  diet controlled , but  having hypoglycemic symptoms 2-3 times per week . Usually in the afternoon  other times in the imddle of the night  Lowest documented cbg was 70    Outpatient Medications Prior to Visit  Medication Sig Dispense Refill   ALPRAZolam (XANAX) 0.5 MG tablet TAKE 1 TABLET BY MOUTH AT BEDTIME AS NEEDED FOR ANXIETY 30 tablet 2   Lactulose 20 GM/30ML SOLN 30 ml every 4 hours until constipation is relieved 236 mL 3   amLODipine (NORVASC) 2.5 MG tablet Take 1 tablet (2.5 mg total) by mouth daily. 90 tablet 1   atorvastatin (LIPITOR) 40 MG tablet Take 1 tablet (40 mg total) by mouth daily. 90 tablet 1   sertraline (ZOLOFT) 100 MG tablet Take 1 tablet (100 mg total) by mouth daily. 90 tablet 1   No facility-administered medications prior to visit.    Review of Systems;  Patient denies headache, fevers, malaise, unintentional  weight loss, skin rash, eye pain, sinus congestion and sinus pain, sore throat, dysphagia,  hemoptysis , cough, dyspnea, wheezing, chest pain, palpitations, orthopnea, edema, abdominal pain, nausea, melena, diarrhea, constipation, flank pain, dysuria, hematuria, urinary  Frequency, nocturia, numbness, tingling, seizures,  Focal weakness, Loss of consciousness,  Tremor, insomnia, depression, anxiety, and suicidal ideation.      Objective:  BP (!) 140/68   Pulse (!) 50   Ht 5\' 1"  (1.549 m)   Wt 107 lb 9.6 oz (48.8 kg)   SpO2 97%   BMI 20.33 kg/m   BP Readings from Last 3 Encounters:  03/03/24 (!) 140/68  10/04/23 130/72  05/06/23 132/82    Wt Readings from Last 3 Encounters:  03/03/24 107 lb 9.6 oz (48.8 kg)  10/04/23 106 lb 12.8 oz (48.4 kg)  05/06/23 104 lb (47.2 kg)    Physical Exam Vitals reviewed.  Constitutional:      General: She is not in acute distress.    Appearance: Normal appearance. She is normal weight. She is not ill-appearing, toxic-appearing or diaphoretic.  HENT:     Head: Normocephalic.  Eyes:     General: No scleral icterus.       Right eye: No discharge.        Left eye: No discharge.     Conjunctiva/sclera: Conjunctivae normal.  Cardiovascular:     Rate and  Rhythm: Normal rate and regular rhythm.     Heart sounds: Normal heart sounds.  Pulmonary:     Effort: Pulmonary effort is normal. No respiratory distress.     Breath sounds: Normal breath sounds.  Musculoskeletal:        General: Normal range of motion.  Skin:    General: Skin is warm and dry.  Neurological:     General: No focal deficit present.     Mental Status: She is alert and oriented to person, place, and time. Mental status is at baseline.  Psychiatric:        Mood and Affect: Mood normal.        Behavior: Behavior normal.        Thought Content: Thought content normal.        Judgment: Judgment normal.    Lab Results  Component Value Date   HGBA1C 5.9 (H) 03/03/2024   HGBA1C  6.7 (H) 10/04/2023   HGBA1C 6.6 (H) 05/06/2023    Lab Results  Component Value Date   CREATININE 0.74 03/03/2024   CREATININE 0.84 10/04/2023   CREATININE 0.74 05/06/2023    Lab Results  Component Value Date   WBC 3.8 (L) 01/28/2023   HGB 13.1 01/28/2023   HCT 39.4 01/28/2023   PLT 240.0 01/28/2023   GLUCOSE 94 03/03/2024   CHOL 176 03/03/2024   TRIG 48 03/03/2024   HDL 68 03/03/2024   LDLDIRECT 86 03/03/2024   LDLCALC 94 03/03/2024   ALT 31 (H) 03/03/2024   AST 30 03/03/2024   NA 144 03/03/2024   K 4.2 03/03/2024   CL 110 03/03/2024   CREATININE 0.74 03/03/2024   BUN 26 (H) 03/03/2024   CO2 22 03/03/2024   TSH 1.03 08/03/2022   HGBA1C 5.9 (H) 03/03/2024   MICROALBUR 1.0 03/03/2024    MM 3D SCREENING MAMMOGRAM BILATERAL BREAST Result Date: 10/19/2023 CLINICAL DATA:  Screening. EXAM: DIGITAL SCREENING BILATERAL MAMMOGRAM WITH TOMOSYNTHESIS AND CAD TECHNIQUE: Bilateral screening digital craniocaudal and mediolateral oblique mammograms were obtained. Bilateral screening digital breast tomosynthesis was performed. The images were evaluated with computer-aided detection. COMPARISON:  Previous exam(s). ACR Breast Density Category b: There are scattered areas of fibroglandular density. FINDINGS: There are no findings suspicious for malignancy. IMPRESSION: No mammographic evidence of malignancy. A result letter of this screening mammogram will be mailed directly to the patient. RECOMMENDATION: Screening mammogram in one year. (Code:SM-B-01Y) BI-RADS CATEGORY  1: Negative. Electronically Signed   By: Ted Mcalpine M.D.   On: 10/19/2023 14:14    Assessment & Plan:  .Colon cancer screening -     Ambulatory referral to Gastroenterology  Essential hypertension -     Comprehensive metabolic panel -     Microalbumin / creatinine urine ratio  DM type 2 with diabetic mixed hyperlipidemia (HCC) Assessment & Plan:  Historically well-controlled on diet alone ., now with recurrent  episodes of hypoglycemia during afternoons and overnight. Placing Freestyle Libre 3 C BG monitor  today to evaluate patters.   Patient is up-to-date on eye exams and foot exam is normal today. Patient has no microalbuminuria and corrected UaCr is 14 . Patient is tolerating statin therapy for CAD risk reduction .  Repeat labs due  Lab Results  Component Value Date   HGBA1C 6.7 (H) 10/04/2023   Lab Results  Component Value Date   MICROALBUR <0.7 05/06/2023   MICROALBUR <0.7 08/19/2022       Lab Results  Component Value Date   CHOL 168 10/04/2023  HDL 70.00 10/04/2023   LDLCALC 88 10/04/2023   LDLDIRECT 79.0 10/04/2023   TRIG 50.0 10/04/2023   CHOLHDL 2 10/04/2023      Orders: -     Comprehensive metabolic panel -     Hemoglobin A1c -     Microalbumin / creatinine urine ratio  Pure hypercholesterolemia -     LDL cholesterol, direct -     Lipid panel  Cervical neck pain with evidence of disc disease Assessment & Plan: She has developed numbness  and pain in the right arm  that radiates from her neck to her elbow and now involves the chest wall , also having  right  leg numbness to the ankle   .  MRI cervical spine needed to rule out spinal stenosis at c5-6  Orders: -     MR CERVICAL SPINE WO CONTRAST; Future  Cervical spondylitis with radiculitis (HCC) -     MR CERVICAL SPINE WO CONTRAST; Future  Insomnia secondary to anxiety Assessment & Plan: She continues to use alprazolam prn. 1/2 to 1 tablet . The risks and benefits of benzodiazepine use were discussed with patient today including excessive sedation leading to respiratory depression,  impaired thinking/driving, and addiction.  Patient was advised to avoid concurrent use with alcohol, to use medication only as needed and not to share with others  .     Other orders -     amLODIPine Besylate; Take 1 tablet (2.5 mg total) by mouth daily.  Dispense: 90 tablet; Refill: 1 -     Atorvastatin Calcium; Take 1 tablet (40  mg total) by mouth daily.  Dispense: 90 tablet; Refill: 1 -     Sertraline HCl; Take 1 tablet (100 mg total) by mouth daily.  Dispense: 90 tablet; Refill: 1 -     Gabapentin; Take 1 capsule (100 mg total) by mouth 3 (three) times daily.  Dispense: 90 capsule; Refill: 3     I spent 34 minutes on the day of this face to face encounter reviewing patient's   prior relevant surgical and non surgical procedures, recent  labs and imaging studies, counseling on weight management,  reviewing the assessment and plan with patient, and post visit ordering and reviewing of  diagnostics and therapeutics with patient  .   Follow-up: No follow-ups on file.   Sherlene Shams, MD

## 2024-03-03 NOTE — Patient Instructions (Addendum)
 By now you have probably received the message from [facility] about a miscalculation on the test that measures the URINE ALBUMIN TO CREATINE  RATIO ,  which helps [place] determine if early kidney disease is present.   I have reviewed your most recent screening test for nephropathy, which was done at your  visit in May 2024. The  corrected  calculation is normal, and we are repeating it annually given your history of hypertension   Your hypoglycemia concerns me.  I recommend keeping a protein bar with you and eating it around 2-3 pm.  To prevent episodes.  Im placing a continuous glucose monitor on you today that will monitor your blood sugars 24/7 for the next 2 weeks.     I am ordering an MRI of your cervical spine and prescribing gabapentin for your  nerve pain at a very low dose that can be increased gradually.   The starting dose is  100 mg 3 times daily as needed ,  The dose can be increased gradually,  By 100 mg per dose up to 300 mg three times daily ,but should not be stopped abruptly (reduce dose to twice daily for 3 days, then once daily for 3 days,  Then stop)

## 2024-03-03 NOTE — Assessment & Plan Note (Addendum)
 Historically well-controlled on diet alone ., now with recurrent episodes of hypoglycemia during afternoons and overnight.], LIKELY due to dietary restrictions.  Placing Freestyle Libre 3 C BG monitor  today to evaluate pattersns   Patient is up-to-date on eye exams and foot exam is normal today. Patient has no microalbuminuria and corrected UaCr is 14 . Patient is tolerating statin therapy for CAD risk reduction . But LDL is not at [facility] on 40 mg daily,  advised to increase dose to 80 mg .   Lab Results  Component Value Date   HGBA1C 5.9 (H) 03/03/2024   Lab Results  Component Value Date   MICROALBUR 1.0 03/03/2024   MICROALBUR <0.7 05/06/2023       Lab Results  Component Value Date   CHOL 176 03/03/2024   HDL 68 03/03/2024   LDLCALC 94 03/03/2024   LDLDIRECT 86 03/03/2024   TRIG 48 03/03/2024   CHOLHDL 2.6 03/03/2024

## 2024-03-03 NOTE — Assessment & Plan Note (Addendum)
 She has developed numbness  and pain in the right arm  that radiates from her neck to her elbow and now involves the chest wall , also having  right  leg numbness to the ankle   .  MRI cervical spine needed to rule out spinal stenosis at c5-6

## 2024-03-04 LAB — LIPID PANEL
Cholesterol: 176 mg/dL (ref <200)
HDL: 68 mg/dL (ref >=50)
LDL Cholesterol (Calc): 94 mg/dL
Non-HDL Cholesterol (Calc): 108 mg/dL (ref <130)
Total CHOL/HDL Ratio: 2.6 (calc) (ref <5.0)
Triglycerides: 48 mg/dL (ref <150)

## 2024-03-04 LAB — COMPREHENSIVE METABOLIC PANEL
AG Ratio: 2.7 (calc) — ABNORMAL HIGH (ref 1.0–2.5)
ALT: 31 U/L — ABNORMAL HIGH (ref 6–29)
AST: 30 U/L (ref 10–35)
Albumin: 4.6 g/dL (ref 3.6–5.1)
Alkaline phosphatase (APISO): 60 U/L (ref 37–153)
BUN/Creatinine Ratio: 35 (calc) — ABNORMAL HIGH (ref 6–22)
BUN: 26 mg/dL — ABNORMAL HIGH (ref 7–25)
CO2: 22 mmol/L (ref 20–32)
Calcium: 9.7 mg/dL (ref 8.6–10.4)
Chloride: 110 mmol/L (ref 98–110)
Creat: 0.74 mg/dL (ref 0.50–1.05)
Globulin: 1.7 g/dL — ABNORMAL LOW (ref 1.9–3.7)
Glucose, Bld: 94 mg/dL (ref 65–99)
Potassium: 4.2 mmol/L (ref 3.5–5.3)
Sodium: 144 mmol/L (ref 135–146)
Total Bilirubin: 0.3 mg/dL (ref 0.2–1.2)
Total Protein: 6.3 g/dL (ref 6.1–8.1)
eGFR: 90 mL/min/{1.73_m2} (ref >=60)

## 2024-03-04 LAB — MICROALBUMIN / CREATININE URINE RATIO
Creatinine, Urine: 38 mg/dL (ref 20–275)
Microalb Creat Ratio: 26 mg/g{creat} (ref <30)
Microalb, Ur: 1 mg/dL

## 2024-03-04 LAB — HEMOGLOBIN A1C
Hgb A1c MFr Bld: 5.9 %{Hb} — ABNORMAL HIGH (ref <5.7)
Mean Plasma Glucose: 123 mg/dL
eAG (mmol/L): 6.8 mmol/L

## 2024-03-04 LAB — LDL CHOLESTEROL, DIRECT: Direct LDL: 86 mg/dL (ref <100)

## 2024-03-05 DIAGNOSIS — M4692 Unspecified inflammatory spondylopathy, cervical region: Secondary | ICD-10-CM | POA: Insufficient documentation

## 2024-03-05 NOTE — Assessment & Plan Note (Signed)
 She continues to use alprazolam prn. 1/2 to 1 tablet . The risks and benefits of benzodiazepine use were discussed with patient today including excessive sedation leading to respiratory depression,  impaired thinking/driving, and addiction.  Patient was advised to avoid concurrent use with alcohol, to use medication only as needed and not to share with others  .

## 2024-03-06 ENCOUNTER — Encounter: Payer: Self-pay | Admitting: Internal Medicine

## 2024-03-06 MED ORDER — ATORVASTATIN CALCIUM 80 MG PO TABS: 40.0000 mg | ORAL_TABLET | Freq: Every day | ORAL | 1 refills | Status: DC

## 2024-03-06 NOTE — Addendum Note (Signed)
 Addended by: Sherlene Shams on: 03/06/2024 06:21 AM   Modules accepted: Orders

## 2024-03-06 NOTE — Assessment & Plan Note (Addendum)
 Increase atorvastatin to 80 mg daily given diabetes and tobacco abuse  Lab Results  Component Value Date   CHOL 176 03/03/2024   HDL 68 03/03/2024   LDLCALC 94 03/03/2024   LDLDIRECT 86 03/03/2024   TRIG 48 03/03/2024   CHOLHDL 2.6 03/03/2024

## 2024-03-16 ENCOUNTER — Ambulatory Visit

## 2024-03-17 ENCOUNTER — Ambulatory Visit: Admitting: Internal Medicine

## 2024-03-17 ENCOUNTER — Encounter: Payer: Self-pay | Admitting: Internal Medicine

## 2024-03-17 VITALS — BP 120/70 | HR 53 | Temp 97.8°F | Ht 61.0 in | Wt 107.8 lb

## 2024-03-17 DIAGNOSIS — Z23 Encounter for immunization: Secondary | ICD-10-CM

## 2024-03-17 DIAGNOSIS — Z794 Long term (current) use of insulin: Secondary | ICD-10-CM | POA: Diagnosis not present

## 2024-03-17 DIAGNOSIS — E1169 Type 2 diabetes mellitus with other specified complication: Secondary | ICD-10-CM

## 2024-03-17 DIAGNOSIS — E782 Mixed hyperlipidemia: Secondary | ICD-10-CM | POA: Diagnosis not present

## 2024-03-17 NOTE — Progress Notes (Signed)
 Subjective:  Patient ID: Sherry Petersen, female    DOB: 01-Apr-1961  Age: 63 y.o. MRN: [id number]  CC: The primary encounter diagnosis was DM type 2 with diabetic mixed hyperlipidemia (HCC). A diagnosis of Need for shingles vaccine was also pertinent to this visit.   HPI Sherry Petersen presents for  Chief Complaint  Patient presents with   Follow-up   Follow up on recurrent hypoglycemia.  Sherry Petersen has diet controlled type 2 DM who has been restricting her diet and having recurrent low blood sugars.  I have confirmed the episodes by downloading and reviewing  the data from patient's [facility] 7 continuous blood glucose monitor for the period of Feb 19 to March 4  Patient's  sugars have been  IN RANGE  96   % OF THE TIME,   BELOW RANGE  3  % of the time, and very low 1% of the time.  The patient works 2nd shift ,  from 1 pm to 10 pm  she eats her dinner and then goes to bed.  Dinner is low carb  the low BS have been occurring  Between 3 am and 9am .  She has been treating the episodes with fruit juice but the correction is transient   Outpatient Medications Prior to Visit  Medication Sig Dispense Refill   ALPRAZolam (XANAX) 0.5 MG tablet TAKE 1 TABLET BY MOUTH AT BEDTIME AS NEEDED FOR ANXIETY 30 tablet 2   amLODipine (NORVASC) 2.5 MG tablet Take 1 tablet (2.5 mg total) by mouth daily. 90 tablet 1   atorvastatin (LIPITOR) 80 MG tablet Take 0.5 tablets (40 mg total) by mouth daily. 90 tablet 1   gabapentin (NEURONTIN) 100 MG capsule Take 1 capsule (100 mg total) by mouth 3 (three) times daily. 90 capsule 3   Lactulose 20 GM/30ML SOLN 30 ml every 4 hours until constipation is relieved 236 mL 3   sertraline (ZOLOFT) 100 MG tablet Take 1 tablet (100 mg total) by mouth daily. 90 tablet 1   No facility-administered medications prior to visit.    Review of Systems;  Patient denies headache, fevers, malaise, unintentional weight loss, skin rash, eye pain, sinus congestion and sinus pain, sore throat,  dysphagia,  hemoptysis , cough, dyspnea, wheezing, chest pain, palpitations, orthopnea, edema, abdominal pain, nausea, melena, diarrhea, constipation, flank pain, dysuria, hematuria, urinary  Frequency, nocturia, numbness, tingling, seizures,  Focal weakness, Loss of consciousness,  Tremor, insomnia, depression, anxiety, and suicidal ideation.      Objective:  BP 120/70   Pulse (!) 53   Temp 97.8 F (36.6 C) (Oral)   Ht 5\' 1"  (1.549 m)   Wt 107 lb 12.8 oz (48.9 kg)   SpO2 95%   BMI 20.37 kg/m   BP Readings from Last 3 Encounters:  03/17/24 120/70  03/03/24 (!) 140/68  10/04/23 130/72    Wt Readings from Last 3 Encounters:  03/17/24 107 lb 12.8 oz (48.9 kg)  03/03/24 107 lb 9.6 oz (48.8 kg)  10/04/23 106 lb 12.8 oz (48.4 kg)    Physical Exam Vitals reviewed.  Constitutional:      General: She is not in acute distress.    Appearance: Normal appearance. She is normal weight. She is not ill-appearing, toxic-appearing or diaphoretic.  HENT:     Head: Normocephalic.  Eyes:     General: No scleral icterus.       Right eye: No discharge.        Left eye: No discharge.  Conjunctiva/sclera: Conjunctivae normal.  Cardiovascular:     Rate and Rhythm: Normal rate and regular rhythm.     Heart sounds: Normal heart sounds.  Pulmonary:     Effort: Pulmonary effort is normal. No respiratory distress.     Breath sounds: Normal breath sounds.  Musculoskeletal:        General: Normal range of motion.  Skin:    General: Skin is warm and dry.  Neurological:     General: No focal deficit present.     Mental Status: She is alert and oriented to person, place, and time. Mental status is at baseline.  Psychiatric:        Mood and Affect: Mood normal.        Behavior: Behavior normal.        Thought Content: Thought content normal.        Judgment: Judgment normal.    Lab Results  Component Value Date   HGBA1C 5.9 (H) 03/03/2024   HGBA1C 6.7 (H) 10/04/2023   HGBA1C 6.6 (H)  05/06/2023    Lab Results  Component Value Date   CREATININE 0.74 03/03/2024   CREATININE 0.84 10/04/2023   CREATININE 0.74 05/06/2023    Lab Results  Component Value Date   WBC 3.8 (L) 01/28/2023   HGB 13.1 01/28/2023   HCT 39.4 01/28/2023   PLT 240.0 01/28/2023   GLUCOSE 94 03/03/2024   CHOL 176 03/03/2024   TRIG 48 03/03/2024   HDL 68 03/03/2024   LDLDIRECT 86 03/03/2024   LDLCALC 94 03/03/2024   ALT 31 (H) 03/03/2024   AST 30 03/03/2024   NA 144 03/03/2024   K 4.2 03/03/2024   CL 110 03/03/2024   CREATININE 0.74 03/03/2024   BUN 26 (H) 03/03/2024   CO2 22 03/03/2024   TSH 1.03 08/03/2022   HGBA1C 5.9 (H) 03/03/2024   MICROALBUR 1.0 03/03/2024    MM 3D SCREENING MAMMOGRAM BILATERAL BREAST Result Date: 10/19/2023 CLINICAL DATA:  Screening. EXAM: DIGITAL SCREENING BILATERAL MAMMOGRAM WITH TOMOSYNTHESIS AND CAD TECHNIQUE: Bilateral screening digital craniocaudal and mediolateral oblique mammograms were obtained. Bilateral screening digital breast tomosynthesis was performed. The images were evaluated with computer-aided detection. COMPARISON:  Previous exam(s). ACR Breast Density Category b: There are scattered areas of fibroglandular density. FINDINGS: There are no findings suspicious for malignancy. IMPRESSION: No mammographic evidence of malignancy. A result letter of this screening mammogram will be mailed directly to the patient. RECOMMENDATION: Screening mammogram in one year. (Code:SM-B-01Y) BI-RADS CATEGORY  1: Negative. Electronically Signed   By: Ted Mcalpine M.D.   On: 10/19/2023 14:14    Assessment & Plan:  .DM type 2 with diabetic mixed hyperlipidemia (HCC) Assessment & Plan: CBG data reviewed.  Advised to add a starch to her evening meal to prevent nocturnal hypoglycemia .  Advised to treat occurrences with not just fruit juice but to add a starch as well.    Need for shingles vaccine     I Follow-up: Return in about 3 months (around  06/16/2024).   Sherry Shams, MD

## 2024-03-17 NOTE — Patient Instructions (Signed)
 Your low blood sugars can be prevented by adding a serving of starch to your dinner meal: potato, rice, pasta or bread,  or a dessert   Treat the low blood sugar with juice PLUS A STARCH or else it will keep falling

## 2024-03-19 NOTE — Assessment & Plan Note (Signed)
 CBG data reviewed.  Advised to add a starch to her evening meal to prevent nocturnal hypoglycemia .  Advised to treat occurrences with not just fruit juice but to add a starch as well.

## 2024-03-24 ENCOUNTER — Ambulatory Visit
Admission: RE | Admit: 2024-03-24 | Discharge: 2024-03-24 | Disposition: A | Discharge status: Home / Self Care | Source: Ambulatory Visit | Attending: Internal Medicine | Admitting: Internal Medicine

## 2024-03-24 DIAGNOSIS — M4692 Unspecified inflammatory spondylopathy, cervical region: Secondary | ICD-10-CM

## 2024-03-24 DIAGNOSIS — M5090 Cervical disc disorder, unspecified, unspecified cervical region: Secondary | ICD-10-CM

## 2024-04-03 ENCOUNTER — Telehealth: Payer: Self-pay

## 2024-04-03 NOTE — Telephone Encounter (Signed)
 Printed Dexcom readings and placed in yellow results folder.

## 2024-04-11 ENCOUNTER — Encounter: Payer: Self-pay | Admitting: Internal Medicine

## 2024-04-11 NOTE — Telephone Encounter (Signed)
 That was the only sensor pt had. I believe it was a sample that she had given us . So the data they brought in several weeks after the sensor expired is all they have. Would you like for me to give pt another sample?

## 2024-04-18 ENCOUNTER — Encounter: Payer: Self-pay | Admitting: Internal Medicine

## 2024-04-27 NOTE — Telephone Encounter (Signed)
 Patient brought dexcom meter in to be downloaded. Sensor given to patient per patient request. Sugars downloaded and placed in Dr Berl Breed results folder.

## 2024-05-03 ENCOUNTER — Telehealth: Payer: Self-pay

## 2024-05-03 NOTE — Telephone Encounter (Signed)
 I left a voicemail for patient asking her to please call us  back to schedule her Welcome to Medicare visit.  E2C2 - when patient calls back, please schedule her Welcome to Medicare visit with Dr. Creta Dolin.  This appointment is 60 minutes in length and must be scheduled prior to 12/14/2024.  Please use the Welcome to Medicare visit type when scheduling.

## 2024-05-03 NOTE — Telephone Encounter (Signed)
 I also sent a message to patient via MyChart.

## 2024-05-19 ENCOUNTER — Other Ambulatory Visit: Payer: Self-pay | Admitting: Internal Medicine

## 2024-05-22 NOTE — Telephone Encounter (Signed)
 LOV: 03/17/2024   NOV: 07/28/24  Pt aware provider is out of the office until Tuesday & has enough until then.

## 2024-06-22 ENCOUNTER — Encounter: Payer: Self-pay | Admitting: Internal Medicine

## 2024-06-22 DIAGNOSIS — C44519 Basal cell carcinoma of skin of other part of trunk: Secondary | ICD-10-CM

## 2024-06-22 DIAGNOSIS — D045 Carcinoma in situ of skin of trunk: Secondary | ICD-10-CM

## 2024-06-23 NOTE — Telephone Encounter (Signed)
 Referral pended for approval

## 2024-07-28 ENCOUNTER — Encounter: Payer: Self-pay | Admitting: Internal Medicine

## 2024-07-28 ENCOUNTER — Ambulatory Visit (INDEPENDENT_AMBULATORY_CARE_PROVIDER_SITE_OTHER): Admitting: Internal Medicine

## 2024-07-28 VITALS — BP 136/68 | HR 51 | Ht 61.0 in | Wt 106.8 lb

## 2024-07-28 DIAGNOSIS — E162 Hypoglycemia, unspecified: Secondary | ICD-10-CM | POA: Insufficient documentation

## 2024-07-28 DIAGNOSIS — Z0000 Encounter for general adult medical examination without abnormal findings: Secondary | ICD-10-CM | POA: Diagnosis not present

## 2024-07-28 DIAGNOSIS — I10 Essential (primary) hypertension: Secondary | ICD-10-CM

## 2024-07-28 DIAGNOSIS — E1169 Type 2 diabetes mellitus with other specified complication: Secondary | ICD-10-CM

## 2024-07-28 DIAGNOSIS — R001 Bradycardia, unspecified: Secondary | ICD-10-CM

## 2024-07-28 DIAGNOSIS — E782 Mixed hyperlipidemia: Secondary | ICD-10-CM

## 2024-07-28 DIAGNOSIS — D126 Benign neoplasm of colon, unspecified: Secondary | ICD-10-CM | POA: Diagnosis not present

## 2024-07-28 DIAGNOSIS — F411 Generalized anxiety disorder: Secondary | ICD-10-CM

## 2024-07-28 DIAGNOSIS — K5909 Other constipation: Secondary | ICD-10-CM

## 2024-07-28 DIAGNOSIS — F410 Panic disorder [episodic paroxysmal anxiety] without agoraphobia: Secondary | ICD-10-CM

## 2024-07-28 MED ORDER — DOXYCYCLINE HYCLATE 100 MG PO TABS: 100.0000 mg | ORAL_TABLET | Freq: Two times a day (BID) | ORAL | 0 refills | Status: DC

## 2024-07-28 NOTE — Patient Instructions (Addendum)
 You need 25 g  fiber daily for health BM's  Options for daily maintenance of stooling    Use Benefiber nightly in water  OR   Eat [place] whole wheat tortillas have 25 g fiber per tortilla!!     Use miralax , citrate of magnesium or dulcolax to get unblocked   Find out if work pays for [facility] 7 or Freestyle libre  glucose monitors  The low blood sugars are WORRISOME .  WORKUP FOR an insulin  secreting tumor is in progress

## 2024-07-28 NOTE — Assessment & Plan Note (Addendum)
 She continues to have Recurrent and Symptomatic epsides occurring 3 hours after eating with drops to 45 NOT precipitated by high GI foods.  Need to rule out insulinoma

## 2024-07-28 NOTE — Assessment & Plan Note (Signed)
Advised to add Benefiber daily

## 2024-07-28 NOTE — Assessment & Plan Note (Signed)
 SHE   has an appt wit Dr Luberta NP next week for one year follow up.

## 2024-07-28 NOTE — Progress Notes (Unsigned)
 Subjective:    Sherry Petersen is a 63 y.o. female who presents for a Welcome to Medicare exam.   Cardiac Risk Factors include: diabetes mellitus;dyslipidemia;family history of premature cardiovascular disease;hypertension;smoking/ tobacco exposure      Objective:    Today's Vitals   07/28/24 1318  BP: 136/68  Pulse: (!) 51  SpO2: 97%  Weight: 106 lb 12.8 oz (48.4 kg)  Height: 5' 1 (1.549 m)  Body mass index is 20.18 kg/m.  Medications Outpatient Encounter Medications as of 07/28/2024  Medication Sig   ALPRAZolam  (XANAX ) 0.5 MG tablet TAKE 1 TABLET BY MOUTH AT BEDTIME AS NEEDED FOR ANXIETY   amLODipine  (NORVASC ) 2.5 MG tablet Take 1 tablet (2.5 mg total) by mouth daily.   atorvastatin  (LIPITOR) 80 MG tablet Take 0.5 tablets (40 mg total) by mouth daily.   gabapentin  (NEURONTIN ) 100 MG capsule Take 1 capsule (100 mg total) by mouth 3 (three) times daily.   sertraline  (ZOLOFT ) 100 MG tablet Take 1 tablet (100 mg total) by mouth daily.   TOLAK 4 % CREA SMARTSIG:sparingly Topical Daily   Lactulose  20 GM/30ML SOLN 30 ml every 4 hours until constipation is relieved (Patient not taking: Reported on 07/28/2024)   No facility-administered encounter medications on file as of 07/28/2024.     History: Past Medical History:  Diagnosis Date   Anxiety    Community acquired pneumonia 12/23/2022   Diabetes mellitus without complication (HCC)    Hematuria    CT Scan normal   Past Surgical History:  Procedure Laterality Date   CARPAL TUNNEL RELEASE  2003   COLONOSCOPY WITH PROPOFOL  N/A 03/05/2023   Procedure: COLONOSCOPY WITH PROPOFOL ;  Surgeon: Onita Elspeth Sharper, DO;  Location: [facility] ENDOSCOPY;  Service: Gastroenterology;  Laterality: N/A;   TONSILLECTOMY  2012   VAGINAL HYSTERECTOMY  2002    Family History  Problem Relation Age of Onset   Diabetes Mother        non-insulin  depend   Mental illness Mother        dementia   Heart attack Father 43   Early death Father     Heart disease Father    Heart attack Sister 40       non smoker    Lupus Sister    Heart attack Brother    CVA Brother    COPD Brother 28       died in his sleep   Heart attack Brother    Breast cancer Neg Hx    Social History   Occupational History   Not on file  Tobacco Use   Smoking status: Former    Current packs/day: 0.00    Average packs/day: 0.3 packs/day for 30.0 years (7.5 ttl pk-yrs)    Types: Cigarettes    Start date: 11/03/1991    Quit date: 11/02/2021    Years since quitting: 2.7   Smokeless tobacco: Never   Tobacco comments:    smokes 4-5 cigs per day  Vaping Use   Vaping status: Never Used  Substance and Sexual Activity   Alcohol use: No    Comment: never   Drug use: No   Sexual activity: Not on file    Tobacco Counseling Counseling given: Not Answered Tobacco comments: smokes 4-5 cigs per day  Sees  Stephane Doughty in [facility]   getting [place] topical for skin CA   Immunizations and Health Maintenance Immunization History  Administered Date(s) Administered   MMR 06/25/1998   Moderna Sars-Covid-2 Vaccination 09/10/2020,  10/15/2020   Tdap 08/15/2013, 10/04/2023   Health Maintenance Due  Topic Date Due   Colonoscopy  03/04/2024   INFLUENZA VACCINE  07/14/2024    Activities of Daily Living    07/28/2024    1:22 PM  In your present state of health, do you have any difficulty performing the following activities:  Hearing? 0  Vision? 0  Difficulty concentrating or making decisions? 0  Walking or climbing stairs? 0  Dressing or bathing? 0  Doing errands, shopping? 0  Preparing Food and eating ? N  Using the Toilet? N  In the past six months, have you accidently leaked urine? N  Do you have problems with loss of bowel control? N  Managing your Medications? N  Managing your Finances? N  Housekeeping or managing your Housekeeping? N    Physical Exam   Physical Exam Vitals reviewed.  Constitutional:      General: She  is not in acute distress.    Appearance: Normal appearance. She is normal weight. She is not ill-appearing, toxic-appearing or diaphoretic.  HENT:     Head: Normocephalic.  Eyes:     General: No scleral icterus.       Right eye: No discharge.        Left eye: No discharge.     Conjunctiva/sclera: Conjunctivae normal.  Cardiovascular:     Rate and Rhythm: Normal rate and regular rhythm.     Heart sounds: Normal heart sounds.  Pulmonary:     Effort: Pulmonary effort is normal. No respiratory distress.     Breath sounds: Normal breath sounds.  Musculoskeletal:        General: Normal range of motion.  Skin:    General: Skin is warm and dry.     Findings: Lesion present.     Comments: Several macular erythematous lesions on forehead and lip   Neurological:     General: No focal deficit present.     Mental Status: She is alert and oriented to person, place, and time. Mental status is at baseline.  Psychiatric:        Mood and Affect: Mood normal.        Behavior: Behavior normal.        Thought Content: Thought content normal.        Judgment: Judgment normal.    (optional), or other factors deemed appropriate based on the beneficiary's medical and social history and current clinical standards.   Advanced Directives: Does Patient Have a Medical Advance Directive?: No Would patient like information on creating a medical advance directive?: No - Patient declined  EKG:  {ekg findings:315101}      Assessment:    This is a routine wellness examination for this patient .   Vision/Hearing screen Vision Screening   Right eye Left eye Both eyes  Without correction     With correction 20/25 20/25 20/25      Goals   None     Depression Screen    07/28/2024    1:34 PM 07/28/2024    1:29 PM 03/17/2024    2:47 PM 03/03/2024    1:33 PM  PHQ 2/9 Scores  PHQ - 2 Score 0 0 0 0  PHQ- 9 Score   1      Fall Risk    07/28/2024    1:33 PM  Fall Risk   Falls in the past year? 0   Number falls in past yr: 0  Injury with Fall? 0  Risk for fall  due to : No Fall Risks  Follow up Falls evaluation completed    Cognitive Function:    07/28/2024    1:34 PM  MMSE - Mini Mental State Exam  Orientation to time 5  Orientation to Place 5  Registration 3  Attention/ Calculation 4  Recall 2  Language- name 2 objects 2  Language- repeat 1  Language- follow 3 step command 3  Language- read & follow direction 1  Write a sentence 1  Copy design 1  Total score 28        Patient Care Team: Marylynn Verneita CROME, MD as PCP - General (Internal Medicine)     Plan:   ***  I have personally reviewed and noted the following in the patient's chart:   Medical and social history Use of alcohol, tobacco or illicit drugs  Current medications and supplements including opioid prescriptions. Patient is not currently taking opioid prescriptions. Functional ability and status Nutritional status Physical activity Advanced directives List of other physicians Hospitalizations, surgeries, and ER visits in previous 12 months Vitals Screenings to include cognitive, depression, and falls Referrals and appointments  In addition, I have reviewed and discussed with patient certain preventive protocols, quality metrics, and best practice recommendations. A written personalized care plan for preventive services as well as general preventive health recommendations were provided to patient.     Verneita CROME Marylynn, MD 07/28/2024

## 2024-07-28 NOTE — Assessment & Plan Note (Signed)
 Well controlled on current regimen. She has no microalbuminuria.  no changes today.   Lab Results  Component Value Date   CREATININE 0.74 03/03/2024   Lab Results  Component Value Date   MICROALBUR 1.0 03/03/2024

## 2024-07-31 ENCOUNTER — Encounter: Payer: Self-pay | Admitting: Internal Medicine

## 2024-07-31 NOTE — Assessment & Plan Note (Signed)
Chronic, worked up in the past by cardiology with Holter monitor. resopnds appropriately to exertion

## 2024-07-31 NOTE — Assessment & Plan Note (Signed)
 Managed with  sertraline  100 mg daily and limited use of alprazolam  to once daily.   The risks and benefits of benzodiazepine use were reviewed with patient today including excessive sedation leading to respiratory depression,  impaired thinking/driving, and addiction.  Patient was advised to avoid concurrent use with alcohol, to use medication only as needed and not to share with others  .

## 2024-07-31 NOTE — Assessment & Plan Note (Addendum)
 She continues to have symptomatic post prandial and fasting HYPOGLYCEMIA despite liberalization of her diet. CBG monitor has been placed to verify readings reportedly < 50.  She is not taking any antihyperglycemic drugs.  Work up for insulinoma in progress

## 2024-08-01 MED ORDER — DEXCOM G7 SENSOR MISC: 2 refills | Status: AC

## 2024-08-03 ENCOUNTER — Ambulatory Visit: Payer: Self-pay | Admitting: Internal Medicine

## 2024-08-10 LAB — C-PEPTIDE: C-Peptide: 1.85 ng/mL (ref 0.80–3.85)

## 2024-08-10 LAB — COMPREHENSIVE METABOLIC PANEL WITH GFR
AG Ratio: 2.7 (calc) — ABNORMAL HIGH (ref 1.0–2.5)
ALT: 17 U/L (ref 6–29)
AST: 27 U/L (ref 10–35)
Albumin: 4.3 g/dL (ref 3.6–5.1)
Alkaline phosphatase (APISO): 56 U/L (ref 37–153)
BUN: 23 mg/dL (ref 7–25)
CO2: 24 mmol/L (ref 20–32)
Calcium: 9.1 mg/dL (ref 8.6–10.4)
Chloride: 110 mmol/L (ref 98–110)
Creat: 0.77 mg/dL (ref 0.50–1.05)
Globulin: 1.6 g/dL — ABNORMAL LOW (ref 1.9–3.7)
Glucose, Bld: 92 mg/dL (ref 65–99)
Potassium: 3.9 mmol/L (ref 3.5–5.3)
Sodium: 143 mmol/L (ref 135–146)
Total Bilirubin: 0.3 mg/dL (ref 0.2–1.2)
Total Protein: 5.9 g/dL — ABNORMAL LOW (ref 6.1–8.1)
eGFR: 86 mL/min/1.73m2 (ref >=60)

## 2024-08-10 LAB — HEMOGLOBIN A1C
Hgb A1c MFr Bld: 5.8 % — ABNORMAL HIGH (ref <5.7)
Mean Plasma Glucose: 120 mg/dL
eAG (mmol/L): 6.6 mmol/L

## 2024-08-10 LAB — LIPID PANEL
Cholesterol: 159 mg/dL (ref <200)
HDL: 75 mg/dL (ref >=50)
LDL Cholesterol (Calc): 68 mg/dL
Non-HDL Cholesterol (Calc): 84 mg/dL (ref <130)
Total CHOL/HDL Ratio: 2.1 (calc) (ref <5.0)
Triglycerides: 77 mg/dL (ref <150)

## 2024-08-10 LAB — INSULIN, FREE (BIOACTIVE): Insulin, Free: 7.3 u[IU]/mL (ref 1.5–14.9)

## 2024-08-10 LAB — INSULIN-LIKE GROWTH FACTOR
IGF-I, LC/MS: 80 ng/mL (ref 41–279)
Z-Score (Female): -0.8 {STDV} (ref ?–2.0)

## 2024-08-23 ENCOUNTER — Encounter: Payer: Self-pay | Admitting: Gastroenterology

## 2024-08-24 ENCOUNTER — Encounter: Payer: Self-pay | Admitting: Gastroenterology

## 2024-09-08 ENCOUNTER — Ambulatory Visit: Admitting: Internal Medicine

## 2024-09-29 ENCOUNTER — Other Ambulatory Visit: Payer: Self-pay

## 2024-09-29 ENCOUNTER — Encounter
Admission: RE | Disposition: A | Discharge status: Home / Self Care | Payer: Self-pay | Source: Home / Self Care | Attending: Gastroenterology

## 2024-09-29 ENCOUNTER — Encounter: Payer: Self-pay | Admitting: Gastroenterology

## 2024-09-29 ENCOUNTER — Ambulatory Visit
Admission: RE | Admit: 2024-09-29 | Discharge: 2024-09-29 | Disposition: A | Discharge status: Home / Self Care | Attending: Gastroenterology | Admitting: Gastroenterology

## 2024-09-29 ENCOUNTER — Ambulatory Visit: Admitting: Anesthesiology

## 2024-09-29 DIAGNOSIS — D125 Benign neoplasm of sigmoid colon: Secondary | ICD-10-CM | POA: Diagnosis not present

## 2024-09-29 DIAGNOSIS — Z860101 Personal history of adenomatous and serrated colon polyps: Secondary | ICD-10-CM | POA: Diagnosis present

## 2024-09-29 DIAGNOSIS — I10 Essential (primary) hypertension: Secondary | ICD-10-CM | POA: Diagnosis not present

## 2024-09-29 DIAGNOSIS — K640 First degree hemorrhoids: Secondary | ICD-10-CM | POA: Diagnosis not present

## 2024-09-29 DIAGNOSIS — Z1211 Encounter for screening for malignant neoplasm of colon: Secondary | ICD-10-CM | POA: Diagnosis not present

## 2024-09-29 DIAGNOSIS — J449 Chronic obstructive pulmonary disease, unspecified: Secondary | ICD-10-CM | POA: Diagnosis not present

## 2024-09-29 DIAGNOSIS — Z79899 Other long term (current) drug therapy: Secondary | ICD-10-CM | POA: Diagnosis not present

## 2024-09-29 DIAGNOSIS — Z87891 Personal history of nicotine dependence: Secondary | ICD-10-CM | POA: Diagnosis not present

## 2024-09-29 DIAGNOSIS — E119 Type 2 diabetes mellitus without complications: Secondary | ICD-10-CM | POA: Insufficient documentation

## 2024-09-29 DIAGNOSIS — F419 Anxiety disorder, unspecified: Secondary | ICD-10-CM | POA: Insufficient documentation

## 2024-09-29 DIAGNOSIS — K5909 Other constipation: Secondary | ICD-10-CM | POA: Diagnosis not present

## 2024-09-29 HISTORY — PX: COLONOSCOPY: SHX5424

## 2024-09-29 LAB — GLUCOSE, CAPILLARY: Glucose-Capillary: 73 mg/dL (ref 70–99)

## 2024-09-29 SURGERY — COLONOSCOPY
Anesthesia: General

## 2024-09-29 MED ORDER — PROPOFOL 500 MG/50ML IV EMUL
INTRAVENOUS | Status: DC | PRN
  Administered 2024-09-29: 75 ug/kg/min via INTRAVENOUS

## 2024-09-29 MED ORDER — LIDOCAINE HCL (CARDIAC) PF 100 MG/5ML IV SOSY
PREFILLED_SYRINGE | INTRAVENOUS | Status: DC | PRN
  Administered 2024-09-29: 40 mg via INTRAVENOUS

## 2024-09-29 MED ORDER — EPHEDRINE SULFATE-NACL 50-0.9 MG/10ML-% IV SOSY
PREFILLED_SYRINGE | INTRAVENOUS | Status: DC | PRN
  Administered 2024-09-29: 10 mg via INTRAVENOUS

## 2024-09-29 MED ORDER — DEXMEDETOMIDINE HCL IN NACL 80 MCG/20ML IV SOLN
INTRAVENOUS | Status: DC | PRN
  Administered 2024-09-29 (×2): 4 ug via INTRAVENOUS
  Administered 2024-09-29: 8 ug via INTRAVENOUS
  Administered 2024-09-29: 4 ug via INTRAVENOUS

## 2024-09-29 MED ORDER — SODIUM CHLORIDE 0.9 % IV SOLN: INTRAVENOUS | Status: DC

## 2024-09-29 MED ADMIN — PROPOFOL 200 MG/20ML IV EMUL: 10 mg | INTRAVENOUS | NDC 00069020910

## 2024-09-29 MED ADMIN — PROPOFOL 200 MG/20ML IV EMUL: 30 mg | INTRAVENOUS | NDC 00069020910

## 2024-09-29 NOTE — H&P (Signed)
 Pre-Procedure H&P   Patient ID: Sherry Petersen is a 63 y.o. female.  Gastroenterology Provider: [name], DO  Referring Provider: Romero [place], PA PCP: Sherry Verneita CROME, MD  Date: 09/29/2024  HPI [name] is a 63 y.o. female who presents today for Colonoscopy for Personal history of colon polyps and constipation .  Patient underwent colonoscopy March 2024 and was found to have tubular adenoma and sessile serrated polyps in the setting of poor prep.  Internal hemorrhoids were also appreciated normal TI.  Constipation being treated with Amitiza  and responding well.  No family history of colon cancer or colon polyps   Past Medical History:  Diagnosis Date   Anxiety    Community acquired pneumonia 12/23/2022   Diabetes mellitus without complication (HCC)    Hematuria    CT Scan normal    Past Surgical History:  Procedure Laterality Date   CARPAL TUNNEL RELEASE  2003   COLONOSCOPY WITH PROPOFOL  N/A 03/05/2023   Procedure: COLONOSCOPY WITH PROPOFOL ;  Surgeon: Petersen Sherry Ozell, DO;  Location: [facility] ENDOSCOPY;  Service: Gastroenterology;  Laterality: N/A;   TONSILLECTOMY  2012   VAGINAL HYSTERECTOMY  2002    Family History No h/o GI disease or malignancy  Review of Systems  Constitutional:  Negative for activity change, appetite change, chills, diaphoresis, fatigue, fever and unexpected weight change.  HENT:  Negative for trouble swallowing and voice change.   Respiratory:  Negative for shortness of breath and wheezing.   Cardiovascular:  Negative for chest pain, palpitations and leg swelling.  Gastrointestinal:  Positive for constipation. Negative for abdominal distention, abdominal pain, anal bleeding, blood in stool, diarrhea, nausea, rectal pain and vomiting.  Musculoskeletal:  Negative for arthralgias and myalgias.  Skin:  Negative for color change and pallor.  Neurological:  Negative for dizziness, syncope and weakness.   Psychiatric/Behavioral:  Negative for confusion.   All other systems reviewed and are negative.    Medications No current facility-administered medications on file prior to encounter.   Current Outpatient Medications on File Prior to Encounter  Medication Sig Dispense Refill   ALPRAZolam  (XANAX ) 0.5 MG tablet TAKE 1 TABLET BY MOUTH AT BEDTIME AS NEEDED FOR ANXIETY 30 tablet 4   amLODipine  (NORVASC ) 2.5 MG tablet Take 1 tablet (2.5 mg total) by mouth daily. 90 tablet 1   atorvastatin  (LIPITOR) 80 MG tablet Take 0.5 tablets (40 mg total) by mouth daily. 90 tablet 1   Continuous Glucose Sensor (DEXCOM G7 SENSOR) MISC Apply sensor once every 10 day for continuous glucose monitoring. 3 each 2   doxycycline  (VIBRA -TABS) 100 MG tablet Take 1 tablet (100 mg total) by mouth 2 (two) times daily. 14 tablet 0   gabapentin  (NEURONTIN ) 100 MG capsule Take 1 capsule (100 mg total) by mouth 3 (three) times daily. 90 capsule 3   sertraline  (ZOLOFT ) 100 MG tablet Take 1 tablet (100 mg total) by mouth daily. 90 tablet 1   TOLAK 4 % CREA SMARTSIG:sparingly Topical Daily      Pertinent medications related to GI and procedure were reviewed by me with the patient prior to the procedure   Current Facility-Administered Medications:    0.9 %  sodium chloride  infusion, , Intravenous, Continuous, Petersen Sherry Ozell, DO, Last Rate: 20 mL/hr at 09/29/24 1001, New Bag at 09/29/24 1001  sodium chloride  20 mL/hr at 09/29/24 1001       Allergies  Allergen Reactions   Neosporin [Neomycin-Bacitracin Zn-Polymyx]    Penicillins  Sulfa Drugs Cross Reactors    Tape    Wellbutrin  [Bupropion ] Nausea Only   Allergies were reviewed by me prior to the procedure  Objective   Body mass index is 20.47 kg/m. Vitals:   09/29/24 0952  BP: 137/71  Pulse: 63  Resp: 18  Temp: (!) 96.5 F (35.8 C)  TempSrc: Tympanic  SpO2: 96%  Weight: 47.5 kg  Height: 5' (1.524 m)     Physical Exam Vitals and nursing note  reviewed.  Constitutional:      General: She is not in acute distress.    Appearance: Normal appearance. She is not ill-appearing, toxic-appearing or diaphoretic.  HENT:     Head: Normocephalic and atraumatic.     Nose: Nose normal.     Mouth/Throat:     Mouth: Mucous membranes are moist.     Pharynx: Oropharynx is clear.  Eyes:     General: No scleral icterus.    Extraocular Movements: Extraocular movements intact.  Cardiovascular:     Rate and Rhythm: Normal rate and regular rhythm.     Heart sounds: Normal heart sounds. No murmur heard.    No friction rub. No gallop.  Pulmonary:     Effort: Pulmonary effort is normal. No respiratory distress.     Breath sounds: Normal breath sounds. No wheezing, rhonchi or rales.  Abdominal:     General: Bowel sounds are normal. There is no distension.     Palpations: Abdomen is soft.     Tenderness: There is no abdominal tenderness. There is no guarding or rebound.  Musculoskeletal:     Cervical back: Neck supple.     Right lower leg: No edema.     Left lower leg: No edema.  Skin:    General: Skin is warm and dry.     Coloration: Skin is not jaundiced or pale.  Neurological:     General: No focal deficit present.     Mental Status: She is alert and oriented to person, place, and time. Mental status is at baseline.  Psychiatric:        Mood and Affect: Mood normal.        Behavior: Behavior normal.        Thought Content: Thought content normal.        Judgment: Judgment normal.      Assessment:  [name] is a 63 y.o. female  who presents today for Colonoscopy for Personal history of colon polyps and constipation .  Plan:  Colonoscopy with possible intervention today  Colonoscopy with possible biopsy, control of bleeding, polypectomy, and interventions as necessary has been discussed with the patient/patient representative. Informed consent was obtained from the patient/patient representative after explaining the  indication, nature, and risks of the procedure including but not limited to death, bleeding, perforation, missed neoplasm/lesions, cardiorespiratory compromise, and reaction to medications. Opportunity for questions was given and appropriate answers were provided. Patient/patient representative has verbalized understanding is amenable to undergoing the procedure.   Sherry Ozell Jungling, DO  [facility]  Portions of the record may have been created with voice recognition software. Occasional wrong-word or 'sound-a-like' substitutions may have occurred due to the inherent limitations of voice recognition software.  Read the chart carefully and recognize, using context, where substitutions may have occurred.

## 2024-09-29 NOTE — Op Note (Signed)
 [facility] Patient Name: Sherry Petersen Procedure Date: 09/29/2024 10:38 AM MRN: [id number] Account #: [id number] Date of Birth: Dec 16, 1960 Admit Type: Outpatient Age: 63 Room: [facility] ENDO ROOM 1 Gender: Female Note Status: Supervisor Override Instrument Name: Peds Colonoscope [id number] Procedure:             Colonoscopy Indications:           High risk colon cancer surveillance: Personal history                         of colonic polyps, chronic constipation Providers:             Elspeth Ozell Onita ROSALEA, DO Referring MD:          Verneita Kettering, MD (Referring MD) Medicines:             Monitored Anesthesia Care Complications:         No immediate complications. Estimated blood loss:                         Minimal. Procedure:             Pre-Anesthesia Assessment:                        - Prior to the procedure, a History and Physical was                         performed, and patient medications and allergies were                         reviewed. The patient is competent. The risks and                         benefits of the procedure and the sedation options and                         risks were discussed with the patient. All questions                         were answered and informed consent was obtained.                         Patient identification and proposed procedure were                         verified by the physician, the nurse, the anesthetist                         and the technician in the endoscopy suite. Mental                         Status Examination: alert and oriented. Airway                         Examination: normal oropharyngeal airway and neck                         mobility. Respiratory Examination: clear to  auscultation. CV Examination: RRR, no murmurs, no S3                         or S4. Prophylactic Antibiotics: The patient does not                         require prophylactic  antibiotics. Prior                         Anticoagulants: The patient has taken no anticoagulant                         or antiplatelet agents. ASA Grade Assessment: III - A                         patient with severe systemic disease. After reviewing                         the risks and benefits, the patient was deemed in                         satisfactory condition to undergo the procedure. The                         anesthesia plan was to use monitored anesthesia care                         (MAC). Immediately prior to administration of                         medications, the patient was re-assessed for adequacy                         to receive sedatives. The heart rate, respiratory                         rate, oxygen saturations, blood pressure, adequacy of                         pulmonary ventilation, and response to care were                         monitored throughout the procedure. The physical                         status of the patient was re-assessed after the                         procedure.                        After obtaining informed consent, the colonoscope was                         passed under direct vision. Throughout the procedure,                         the patient's blood pressure, pulse, and oxygen  saturations were monitored continuously. The                         Colonoscope was introduced through the anus and                         advanced to the the terminal ileum, with                         identification of the appendiceal orifice and IC                         valve. The colonoscopy was performed without                         difficulty. The patient tolerated the procedure well.                         The quality of the bowel preparation was evaluated                         using the BBPS [facility] Bowel Preparation Scale) with                         scores of: Right Colon = 3 (entire mucosa seen well                          with no residual staining, small fragments of stool or                         opaque liquid), Transverse Colon = 3 (entire mucosa                         seen well with no residual staining, small fragments                         of stool or opaque liquid) and Left Colon = 2 (minor                         amount of residual staining, small fragments of stool                         and/or opaque liquid, but mucosa seen well). The total                         BBPS score equals 8. The quality of the bowel                         preparation was excellent. The terminal ileum,                         ileocecal valve, appendiceal orifice, and rectum were                         photographed. Findings:      The perianal and digital rectal examinations were normal. Pertinent       negatives include normal sphincter tone.  The terminal ileum appeared normal. Estimated blood loss: none.      A 1 to 2 mm polyp was found in the sigmoid colon. The polyp was sessile.       The polyp was removed with a cold biopsy forceps. Resection and       retrieval were complete. Estimated blood loss was minimal.      Non-bleeding internal hemorrhoids were found during retroflexion. The       hemorrhoids were Grade I (internal hemorrhoids that do not prolapse).       Estimated blood loss: none.      The exam was otherwise without abnormality on direct and retroflexion       views. Impression:            - The examined portion of the ileum was normal.                        - One 1 to 2 mm polyp in the sigmoid colon, removed                         with a cold biopsy forceps. Resected and retrieved.                        - Non-bleeding internal hemorrhoids.                        - The examination was otherwise normal on direct and                         retroflexion views. Recommendation:        - Patient has a contact number available for                         emergencies. The signs  and symptoms of potential                         delayed complications were discussed with the patient.                         Return to normal activities tomorrow. Written                         discharge instructions were provided to the patient.                        - Discharge patient to home.                        - Resume previous diet.                        - Continue present medications.                        - Await pathology results.                        - Repeat colonoscopy for surveillance based on                         pathology results.                        -  Return to GI office as previously scheduled.                        - The findings and recommendations were discussed with                         the patient. Procedure Code(s):     --- Professional ---                        [phone], Colonoscopy, flexible; with biopsy, single or                         multiple Diagnosis Code(s):     --- Professional ---                        Z86.010, Personal history of colonic polyps                        K64.0, First degree hemorrhoids                        D12.5, Benign neoplasm of sigmoid colon CPT copyright 2022 American Medical Association. All rights reserved. The codes documented in this report are preliminary and upon coder review may  be revised to meet current compliance requirements. Attending Participation:      I personally performed the entire procedure. Elspeth Jungling, DO [name] DO, DO 09/29/2024 11:07:43 AM This report has been signed electronically. Number of Addenda: 0 Note Initiated On: 09/29/2024 10:38 AM Scope Withdrawal Time: 0 hours 12 minutes 23 seconds  Total Procedure Duration: 0 hours 18 minutes 54 seconds  Estimated Blood Loss:  Estimated blood loss was minimal.      [facility]

## 2024-09-29 NOTE — Anesthesia Postprocedure Evaluation (Signed)
 Anesthesia Post Note  Patient: DACHELLE MOLZAHN  Procedure(s) Performed: COLONOSCOPY  Patient location during evaluation: Endoscopy Anesthesia Type: General Level of consciousness: awake and alert Pain management: pain level controlled Vital Signs Assessment: post-procedure vital signs reviewed and stable Respiratory status: spontaneous breathing, nonlabored ventilation, respiratory function stable and patient connected to nasal cannula oxygen Cardiovascular status: blood pressure returned to baseline and stable Postop Assessment: no apparent nausea or vomiting Anesthetic complications: no   No notable events documented.   Last Vitals:  Vitals:   09/29/24 1124 09/29/24 1127  BP:  106/67  Pulse: (!) 55 (!) 54  Resp: 15 17  Temp:    SpO2: 99% 99%    Last Pain:  Vitals:   09/29/24 1100  TempSrc: Temporal                 Debby Mines

## 2024-09-29 NOTE — Transfer of Care (Signed)
 Immediate Anesthesia Transfer of Care Note  Patient: Sherry Petersen  Procedure(s) Performed: COLONOSCOPY  Patient Location: PACU  Anesthesia Type:General  Level of Consciousness: sedated  Airway & Oxygen Therapy: Patient Spontanous Breathing  Post-op Assessment: Report given to RN and Post -op Vital signs reviewed and stable  Post vital signs: Reviewed and stable  Last Vitals:  Vitals Value Taken Time  BP 90/52 09/29/24 11:08  Temp    Pulse 64 09/29/24 11:09  Resp 31 09/29/24 11:09  SpO2 98 % 09/29/24 11:09  Vitals shown include unfiled device data.  Last Pain:  Vitals:   09/29/24 0952  TempSrc: Tympanic         Complications: No notable events documented.

## 2024-09-29 NOTE — Anesthesia Preprocedure Evaluation (Signed)
 Anesthesia Evaluation  Patient identified by MRN, date of birth, ID band Patient awake    Reviewed: Allergy & Precautions, NPO status , Patient's Chart, lab work & pertinent test results  Airway Mallampati: II  TM Distance: >3 FB Neck ROM: Full    Dental  (+) Partial Upper, Partial Lower   Pulmonary neg pulmonary ROS, COPD, former smoker   Pulmonary exam normal  + decreased breath sounds      Cardiovascular Exercise Tolerance: Good hypertension, Pt. on medications negative cardio ROS Normal cardiovascular exam Rhythm:Regular     Neuro/Psych  Headaches PSYCHIATRIC DISORDERS Anxiety     negative neurological ROS     GI/Hepatic negative GI ROS, Neg liver ROS,,,  Endo/Other  negative endocrine ROSdiabetes, Well Controlled, Type 2    Renal/GU      Musculoskeletal   Abdominal   Peds  Hematology negative hematology ROS (+)   Anesthesia Other Findings Past Medical History: No date: Anxiety No date: Diabetes mellitus without complication (HCC) No date: Hematuria     Comment:  CT Scan normal  Past Surgical History: 2003: CARPAL TUNNEL RELEASE 2012: TONSILLECTOMY 2002: VAGINAL HYSTERECTOMY  BMI    Body Mass Index: 19.01 kg/m      Reproductive/Obstetrics negative OB ROS                              Anesthesia Physical Anesthesia Plan  ASA: 3  Anesthesia Plan: General   Post-op Pain Management: Minimal or no pain anticipated   Induction: Intravenous  PONV Risk Score and Plan: 2 and Propofol  infusion and TIVA  Airway Management Planned: Nasal Cannula  Additional Equipment: None  Intra-op Plan:   Post-operative Plan:   Informed Consent: I have reviewed the patients History and Physical, chart, labs and discussed the procedure including the risks, benefits and alternatives for the proposed anesthesia with the patient or authorized representative who has indicated his/her  understanding and acceptance.     Dental advisory given  Plan Discussed with: CRNA and Surgeon  Anesthesia Plan Comments: (Discussed risks of anesthesia with patient, including possibility of difficulty with spontaneous ventilation under anesthesia necessitating airway intervention, PONV, and rare risks such as cardiac or respiratory or neurological events, and allergic reactions. Discussed the role of CRNA in patient's perioperative care. Patient understands.)        Anesthesia Quick Evaluation

## 2024-09-29 NOTE — Interval H&P Note (Signed)
 History and Physical Interval Note: Preprocedure H&P from 09/29/24  was reviewed and there was no interval change after seeing and examining the patient.  Written consent was obtained from the patient after discussion of risks, benefits, and alternatives. Patient has consented to proceed with Colonoscopy with possible intervention   09/29/2024 10:36 AM  Sherry Petersen  has presented today for surgery, with the diagnosis of Chronic constipation (K59.09) Personal history of adenomatous and serrated colon polyps (Z86.0101).  The various methods of treatment have been discussed with the patient and family. After consideration of risks, benefits and other options for treatment, the patient has consented to  Procedure(s): COLONOSCOPY (N/A) as a surgical intervention.  The patient's history has been reviewed, patient examined, no change in status, stable for surgery.  I have reviewed the patient's chart and labs.  Questions were answered to the patient's satisfaction.     [name]

## 2024-10-02 LAB — SURGICAL PATHOLOGY

## 2024-10-19 ENCOUNTER — Other Ambulatory Visit: Payer: Self-pay | Admitting: Internal Medicine

## 2024-10-20 ENCOUNTER — Ambulatory Visit: Admitting: Internal Medicine

## 2024-10-20 ENCOUNTER — Encounter: Payer: Self-pay | Admitting: Internal Medicine

## 2024-10-20 VITALS — BP 126/76 | HR 68 | Temp 98.0°F | Ht 61.0 in | Wt 104.6 lb

## 2024-10-20 DIAGNOSIS — Z79899 Other long term (current) drug therapy: Secondary | ICD-10-CM

## 2024-10-20 DIAGNOSIS — Z122 Encounter for screening for malignant neoplasm of respiratory organs: Secondary | ICD-10-CM

## 2024-10-20 DIAGNOSIS — E782 Mixed hyperlipidemia: Secondary | ICD-10-CM

## 2024-10-20 DIAGNOSIS — F5105 Insomnia due to other mental disorder: Secondary | ICD-10-CM

## 2024-10-20 DIAGNOSIS — I10 Essential (primary) hypertension: Secondary | ICD-10-CM

## 2024-10-20 DIAGNOSIS — E1169 Type 2 diabetes mellitus with other specified complication: Secondary | ICD-10-CM

## 2024-10-20 DIAGNOSIS — E785 Hyperlipidemia, unspecified: Secondary | ICD-10-CM | POA: Diagnosis not present

## 2024-10-20 DIAGNOSIS — F410 Panic disorder [episodic paroxysmal anxiety] without agoraphobia: Secondary | ICD-10-CM

## 2024-10-20 DIAGNOSIS — D126 Benign neoplasm of colon, unspecified: Secondary | ICD-10-CM

## 2024-10-20 DIAGNOSIS — Z1231 Encounter for screening mammogram for malignant neoplasm of breast: Secondary | ICD-10-CM

## 2024-10-20 DIAGNOSIS — F419 Anxiety disorder, unspecified: Secondary | ICD-10-CM

## 2024-10-20 DIAGNOSIS — F411 Generalized anxiety disorder: Secondary | ICD-10-CM

## 2024-10-20 LAB — COMPREHENSIVE METABOLIC PANEL WITH GFR
ALT: 11 U/L (ref 0–35)
AST: 21 U/L (ref 0–37)
Albumin: 4.4 g/dL (ref 3.5–5.2)
Alkaline Phosphatase: 61 U/L (ref 39–117)
BUN: 17 mg/dL (ref 6–23)
CO2: 26 meq/L (ref 19–32)
Calcium: 9.3 mg/dL (ref 8.4–10.5)
Chloride: 108 meq/L (ref 96–112)
Creatinine, Ser: 0.72 mg/dL (ref 0.40–1.20)
GFR: 87.5 mL/min (ref >60.00)
Glucose, Bld: 81 mg/dL (ref 70–99)
Potassium: 4.1 meq/L (ref 3.5–5.1)
Sodium: 145 meq/L (ref 135–145)
Total Bilirubin: 0.5 mg/dL (ref 0.2–1.2)
Total Protein: 6.2 g/dL (ref 6.0–8.3)

## 2024-10-20 LAB — LIPID PANEL
Cholesterol: 166 mg/dL (ref 0–200)
HDL: 78.5 mg/dL (ref >39.00)
LDL Cholesterol: 80 mg/dL (ref 0–99)
NonHDL: 87.69
Total CHOL/HDL Ratio: 2
Triglycerides: 36 mg/dL (ref 0.0–149.0)
VLDL: 7.2 mg/dL (ref 0.0–40.0)

## 2024-10-20 LAB — CBC WITH DIFFERENTIAL/PLATELET
Basophils Absolute: 0.1 K/uL (ref 0.0–0.1)
Basophils Relative: 1.3 % (ref 0.0–3.0)
Eosinophils Absolute: 0.2 K/uL (ref 0.0–0.7)
Eosinophils Relative: 5 % (ref 0.0–5.0)
HCT: 41.2 % (ref 36.0–46.0)
Hemoglobin: 13.5 g/dL (ref 12.0–15.0)
Lymphocytes Relative: 38.2 % (ref 12.0–46.0)
Lymphs Abs: 1.7 K/uL (ref 0.7–4.0)
MCHC: 32.7 g/dL (ref 30.0–36.0)
MCV: 94.2 fl (ref 78.0–100.0)
Monocytes Absolute: 0.3 K/uL (ref 0.1–1.0)
Monocytes Relative: 8 % (ref 3.0–12.0)
Neutro Abs: 2.1 K/uL (ref 1.4–7.7)
Neutrophils Relative %: 47.5 % (ref 43.0–77.0)
Platelets: 214 K/uL (ref 150.0–400.0)
RBC: 4.38 Mil/uL (ref 3.87–5.11)
RDW: 14.8 % (ref 11.5–15.5)
WBC: 4.3 K/uL (ref 4.0–10.5)

## 2024-10-20 LAB — LDL CHOLESTEROL, DIRECT: Direct LDL: 73 mg/dL

## 2024-10-20 LAB — TSH: TSH: 1.11 u[IU]/mL (ref 0.35–5.50)

## 2024-10-20 LAB — HEMOGLOBIN A1C: Hgb A1c MFr Bld: 6.6 % — ABNORMAL HIGH (ref 4.6–6.5)

## 2024-10-20 MED ORDER — ATORVASTATIN CALCIUM 80 MG PO TABS: 80.0000 mg | ORAL_TABLET | Freq: Every day | ORAL | 1 refills | Status: DC

## 2024-10-20 MED ORDER — AMLODIPINE BESYLATE 2.5 MG PO TABS: 2.5000 mg | ORAL_TABLET | Freq: Every day | ORAL | 1 refills | Status: AC

## 2024-10-20 MED ORDER — SERTRALINE HCL 100 MG PO TABS: 100.0000 mg | ORAL_TABLET | Freq: Every day | ORAL | 1 refills | Status: AC

## 2024-10-20 NOTE — Assessment & Plan Note (Signed)
 Repeat colonoscopy oct 2025 normal.  Follow up 5 yrs

## 2024-10-20 NOTE — Progress Notes (Signed)
 Subjective:  Patient ID: Sherry Petersen, female    DOB: 1961-10-17  Age: 63 y.o. MRN: [id number]  CC: The primary encounter diagnosis was Encounter for screening mammogram for malignant neoplasm of breast. Diagnoses of Essential hypertension, DM type 2 with diabetic mixed hyperlipidemia (HCC), Hyperlipidemia LDL goal <70, Long-term use of high-risk medication, Screening for lung cancer, Tubular adenoma of colon, Insomnia secondary to anxiety, and Generalized anxiety disorder with panic attacks were also pertinent to this visit.   HPI ALGIE CALES presents for  Chief Complaint  Patient presents with   Diabetes    F/UP   1) follow up on type 2 DM with recurrent hypoglycemia :  she  is no longer wearing a CBG monitor and denies any hypoglycemic symptoms.  Managing her diabetes with low GI diet.   2) Stress:  family stressors still present due to daughter's husband leaving her with children .  Daughter and grandchild have moved in with patient .  Taking zoloft  daily using alprazolam  to manage insomnia   3) Hypertension: patient checks blood pressure weekly at home.  Readings have been for the most part <130/80 at rest . Patient is following a reduced salt diet most days and is taking medications as prescribed    Outpatient Medications Prior to Visit  Medication Sig Dispense Refill   ALPRAZolam  (XANAX ) 0.5 MG tablet TAKE 1 TABLET BY MOUTH AT BEDTIME AS NEEDED FOR ANXIETY 30 tablet 2   Continuous Glucose Sensor (DEXCOM G7 SENSOR) MISC Apply sensor once every 10 day for continuous glucose monitoring. 3 each 2   gabapentin  (NEURONTIN ) 100 MG capsule Take 1 capsule (100 mg total) by mouth 3 (three) times daily. 90 capsule 3   TOLAK 4 % CREA SMARTSIG:sparingly Topical Daily     amLODipine  (NORVASC ) 2.5 MG tablet Take 1 tablet (2.5 mg total) by mouth daily. 90 tablet 1   atorvastatin  (LIPITOR) 80 MG tablet Take 0.5 tablets (40 mg total) by mouth daily. 90 tablet 1   doxycycline  (VIBRA -TABS) 100  MG tablet Take 1 tablet (100 mg total) by mouth 2 (two) times daily. (Patient not taking: Reported on 10/20/2024) 14 tablet 0   sertraline  (ZOLOFT ) 100 MG tablet Take 1 tablet (100 mg total) by mouth daily. 90 tablet 1   No facility-administered medications prior to visit.    Review of Systems;  Patient denies headache, fevers, malaise, unintentional weight loss, skin rash, eye pain, sinus congestion and sinus pain, sore throat, dysphagia,  hemoptysis , cough, dyspnea, wheezing, chest pain, palpitations, orthopnea, edema, abdominal pain, nausea, melena, diarrhea, constipation, flank pain, dysuria, hematuria, urinary  Frequency, nocturia, numbness, tingling, seizures,  Focal weakness, Loss of consciousness,  Tremor, insomnia, depression, anxiety, and suicidal ideation.      Objective:  BP 126/76   Pulse 68   Temp 98 F (36.7 C) (Oral)   Ht 5' 1 (1.549 m)   Wt 104 lb 9.6 oz (47.4 kg)   SpO2 97%   BMI 19.76 kg/m   BP Readings from Last 3 Encounters:  10/20/24 126/76  09/29/24 106/67  07/28/24 136/68    Wt Readings from Last 3 Encounters:  10/20/24 104 lb 9.6 oz (47.4 kg)  09/29/24 104 lb 12.8 oz (47.5 kg)  07/28/24 106 lb 12.8 oz (48.4 kg)    Physical Exam Vitals reviewed.  Constitutional:      General: She is not in acute distress.    Appearance: Normal appearance. She is normal weight. She is not ill-appearing, toxic-appearing or  diaphoretic.  HENT:     Head: Normocephalic.  Eyes:     General: No scleral icterus.       Right eye: No discharge.        Left eye: No discharge.     Conjunctiva/sclera: Conjunctivae normal.  Cardiovascular:     Rate and Rhythm: Normal rate and regular rhythm.     Heart sounds: Normal heart sounds.  Pulmonary:     Effort: Pulmonary effort is normal. No respiratory distress.     Breath sounds: Normal breath sounds.  Musculoskeletal:        General: Normal range of motion.  Skin:    General: Skin is warm and dry.  Neurological:      General: No focal deficit present.     Mental Status: She is alert and oriented to person, place, and time. Mental status is at baseline.  Psychiatric:        Mood and Affect: Mood normal.        Behavior: Behavior normal.        Thought Content: Thought content normal.        Judgment: Judgment normal.     Lab Results  Component Value Date   HGBA1C 6.6 (H) 10/20/2024   HGBA1C 5.8 (H) 07/28/2024   HGBA1C 5.9 (H) 03/03/2024    Lab Results  Component Value Date   CREATININE 0.72 10/20/2024   CREATININE 0.77 07/28/2024   CREATININE 0.74 03/03/2024    Lab Results  Component Value Date   WBC 4.3 10/20/2024   HGB 13.5 10/20/2024   HCT 41.2 10/20/2024   PLT 214.0 10/20/2024   GLUCOSE 81 10/20/2024   CHOL 166 10/20/2024   TRIG 36.0 10/20/2024   HDL 78.50 10/20/2024   LDLDIRECT 73.0 10/20/2024   LDLCALC 80 10/20/2024   ALT 11 10/20/2024   AST 21 10/20/2024   NA 145 10/20/2024   K 4.1 10/20/2024   CL 108 10/20/2024   CREATININE 0.72 10/20/2024   BUN 17 10/20/2024   CO2 26 10/20/2024   TSH 1.11 10/20/2024   HGBA1C 6.6 (H) 10/20/2024   MICROALBUR 1.0 03/03/2024    No results found.  Assessment & Plan:  .Encounter for screening mammogram for malignant neoplasm of breast -     3D Screening Mammogram, Left and Right; Future  Essential hypertension Assessment & Plan: Well controlled on current regimen. She has no microalbuminuria.  no changes today.   Lab Results  Component Value Date   CREATININE 0.72 10/20/2024   Lab Results  Component Value Date   MICROALBUR 1.0 03/03/2024       Orders: -     Comprehensive metabolic panel with GFR; Future  DM type 2 with diabetic mixed hyperlipidemia (HCC) Assessment & Plan: She has liberalized her diet to resolve recurrent symptomatic post prandial and fasting HYPOGLYCEMIA . Reviewed  workup for insulinoma which was negative . No longer using CBG monitor  .  She has no microalbuminuria and LDL is near goal.   Lab  Results  Component Value Date   HGBA1C 5.8 (H) 07/28/2024   Lab Results  Component Value Date   MICROALBUR 1.0 03/03/2024     Lab Results  Component Value Date   CHOL 166 10/20/2024   HDL 78.50 10/20/2024   LDLCALC 80 10/20/2024   LDLDIRECT 73.0 10/20/2024   TRIG 36.0 10/20/2024   CHOLHDL 2 10/20/2024     Orders: -     Comprehensive metabolic panel with GFR; Future -  Hemoglobin A1c; Future  Hyperlipidemia LDL goal <70 -     Lipid panel; Future -     LDL cholesterol, direct; Future  Long-term use of high-risk medication -     CBC with Differential/Platelet; Future -     TSH; Future  Screening for lung cancer -     Ambulatory Referral for Lung Cancer Scre  Tubular adenoma of colon Assessment & Plan: Repeat colonoscopy oct 2025 normal.  Follow up 5 yrs   Insomnia secondary to anxiety Assessment & Plan: She continues to use alprazolam  prn. 1/2 to 1 tablet . The risks and benefits of benzodiazepine use were discussed with patient today including excessive sedation leading to respiratory depression,  impaired thinking/driving, and addiction.  Patient was advised to avoid concurrent use with alcohol, to use medication only as needed and not to share with others  .     Generalized anxiety disorder with panic attacks Assessment & Plan: Managed with  sertraline  100 mg daily and limited use of alprazolam  to once daily.   The risks and benefits of benzodiazepine use were reviewed with patient today including excessive sedation leading to respiratory depression,  impaired thinking/driving, and addiction.  Patient was advised to avoid concurrent use with alcohol, to use medication only as needed and not to share with others  .    Other orders -     amLODIPine  Besylate; Take 1 tablet (2.5 mg total) by mouth daily.  Dispense: 90 tablet; Refill: 1 -     Atorvastatin  Calcium ; Take 1 tablet (80 mg total) by mouth daily.  Dispense: 90 tablet; Refill: 1 -     Sertraline  HCl;  Take 1 tablet (100 mg total) by mouth daily.  Dispense: 90 tablet; Refill: 1    Follow-up: No follow-ups on file.   Verneita LITTIE Kettering, MD

## 2024-10-20 NOTE — Assessment & Plan Note (Addendum)
 She has liberalized her diet to resolve recurrent symptomatic post prandial and fasting HYPOGLYCEMIA . Reviewed  workup for insulinoma which was negative . No longer using CBG monitor  .  She has no microalbuminuria and LDL is near goal.   Lab Results  Component Value Date   HGBA1C 5.8 (H) 07/28/2024   Lab Results  Component Value Date   MICROALBUR 1.0 03/03/2024     Lab Results  Component Value Date   CHOL 166 10/20/2024   HDL 78.50 10/20/2024   LDLCALC 80 10/20/2024   LDLDIRECT 73.0 10/20/2024   TRIG 36.0 10/20/2024   CHOLHDL 2 10/20/2024

## 2024-10-20 NOTE — Patient Instructions (Addendum)
 No changes were made to your medications today except to CORRECT the atorvastatin  dose to 80 mg daily   Blood pressure is at goal; continue amlodipine   Referral to [facility] for lung cancer screening has been made.  If you are not contacted in one week,  check your mychart.  The referral coordinators will send you a message if they can't reach you by phone.

## 2024-10-21 NOTE — Assessment & Plan Note (Signed)
 Managed with  sertraline  100 mg daily and limited use of alprazolam  to once daily.   The risks and benefits of benzodiazepine use were reviewed with patient today including excessive sedation leading to respiratory depression,  impaired thinking/driving, and addiction.  Patient was advised to avoid concurrent use with alcohol, to use medication only as needed and not to share with others  .

## 2024-10-21 NOTE — Assessment & Plan Note (Signed)
 She continues to use alprazolam prn. 1/2 to 1 tablet . The risks and benefits of benzodiazepine use were discussed with patient today including excessive sedation leading to respiratory depression,  impaired thinking/driving, and addiction.  Patient was advised to avoid concurrent use with alcohol, to use medication only as needed and not to share with others  .

## 2024-10-21 NOTE — Assessment & Plan Note (Signed)
 Well controlled on current regimen. She has no microalbuminuria.  no changes today.   Lab Results  Component Value Date   CREATININE 0.72 10/20/2024   Lab Results  Component Value Date   MICROALBUR 1.0 03/03/2024

## 2024-10-22 ENCOUNTER — Ambulatory Visit: Payer: Self-pay | Admitting: Internal Medicine

## 2025-01-12 ENCOUNTER — Other Ambulatory Visit: Payer: Self-pay | Admitting: Internal Medicine

## 2025-01-12 ENCOUNTER — Encounter: Payer: Self-pay | Admitting: Internal Medicine

## 2025-01-16 MED ORDER — ATORVASTATIN CALCIUM 80 MG PO TABS: 80.0000 mg | ORAL_TABLET | Freq: Every day | ORAL | 1 refills | Status: AC

## 2025-01-16 MED ORDER — ALPRAZOLAM 0.5 MG PO TABS: ORAL_TABLET | ORAL | 2 refills | Status: AC

## 2025-01-16 NOTE — Addendum Note (Signed)
 Addended by: MARYLYNN VERNEITA CROME on: 01/16/2025 12:45 PM   Modules accepted: Orders

## 2025-04-12 ENCOUNTER — Ambulatory Visit: Admitting: Internal Medicine
